# Patient Record
Sex: Female | Born: 1980 | Race: Black or African American | Hispanic: No | Marital: Married | Smoking: Former smoker
Health system: Southern US, Community
[De-identification: ages and names within clinical notes are randomized; demographics above are authoritative.]

## PROBLEM LIST (undated history)

## (undated) DIAGNOSIS — I10 Essential (primary) hypertension: Secondary | ICD-10-CM

## (undated) DIAGNOSIS — E785 Hyperlipidemia, unspecified: Secondary | ICD-10-CM

## (undated) DIAGNOSIS — D649 Anemia, unspecified: Secondary | ICD-10-CM

## (undated) DIAGNOSIS — J302 Other seasonal allergic rhinitis: Secondary | ICD-10-CM

## (undated) DIAGNOSIS — T7840XA Allergy, unspecified, initial encounter: Secondary | ICD-10-CM

## (undated) DIAGNOSIS — E7800 Pure hypercholesterolemia, unspecified: Secondary | ICD-10-CM

## (undated) HISTORY — DX: Hyperlipidemia, unspecified: E78.5

## (undated) HISTORY — DX: Allergy, unspecified, initial encounter: T78.40XA

## (undated) HISTORY — DX: Anemia, unspecified: D64.9

## (undated) HISTORY — PX: WRIST SURGERY: SHX841

## (undated) HISTORY — DX: Essential (primary) hypertension: I10

## (undated) HISTORY — DX: Other seasonal allergic rhinitis: J30.2

---

## 2005-06-06 ENCOUNTER — Emergency Department (HOSPITAL_COMMUNITY): Admission: EM | Admit: 2005-06-06 | Discharge: 2005-06-06 | Payer: Self-pay | Admitting: Emergency Medicine

## 2005-06-09 ENCOUNTER — Emergency Department (HOSPITAL_COMMUNITY): Admission: EM | Admit: 2005-06-09 | Discharge: 2005-06-09 | Payer: Self-pay | Admitting: Emergency Medicine

## 2010-06-20 ENCOUNTER — Emergency Department (HOSPITAL_COMMUNITY): Admission: EM | Admit: 2010-06-20 | Discharge: 2010-06-20 | Payer: Self-pay | Admitting: Emergency Medicine

## 2011-05-23 ENCOUNTER — Encounter: Payer: Self-pay | Admitting: *Deleted

## 2011-05-23 ENCOUNTER — Emergency Department (HOSPITAL_COMMUNITY)
Admission: EM | Admit: 2011-05-23 | Discharge: 2011-05-23 | Disposition: A | Discharge status: Home / Self Care | Payer: Self-pay | Attending: Emergency Medicine | Admitting: Emergency Medicine

## 2011-05-23 DIAGNOSIS — R51 Headache: Secondary | ICD-10-CM | POA: Insufficient documentation

## 2011-05-23 DIAGNOSIS — F17200 Nicotine dependence, unspecified, uncomplicated: Secondary | ICD-10-CM | POA: Insufficient documentation

## 2011-05-23 DIAGNOSIS — I10 Essential (primary) hypertension: Secondary | ICD-10-CM | POA: Insufficient documentation

## 2011-05-23 LAB — URINALYSIS, ROUTINE W REFLEX MICROSCOPIC
Bilirubin Urine: NEGATIVE
Ketones, ur: NEGATIVE mg/dL
Nitrite: NEGATIVE
Protein, ur: NEGATIVE mg/dL
Specific Gravity, Urine: 1.015 (ref 1.005–1.030)
pH: 6.5 (ref 5.0–8.0)

## 2011-05-23 LAB — URINE MICROSCOPIC-ADD ON

## 2011-05-23 MED ORDER — METOCLOPRAMIDE HCL 5 MG/ML IJ SOLN
10.0000 mg | Freq: Once | INTRAMUSCULAR | Status: AC
  Administered 2011-05-23: 10 mg via INTRAMUSCULAR
  Filled 2011-05-23: qty 2

## 2011-05-23 MED ORDER — KETOROLAC TROMETHAMINE 60 MG/2ML IM SOLN
60.0000 mg | Freq: Once | INTRAMUSCULAR | Status: AC
  Administered 2011-05-23: 60 mg via INTRAMUSCULAR
  Filled 2011-05-23: qty 2

## 2011-05-23 MED ORDER — DIPHENHYDRAMINE HCL 25 MG PO CAPS
25.0000 mg | ORAL_CAPSULE | Freq: Once | ORAL | Status: AC
  Administered 2011-05-23: 25 mg via ORAL
  Filled 2011-05-23: qty 1

## 2011-05-23 MED ORDER — BUTALBITAL-APAP-CAFFEINE 50-325-40 MG PO TABS: 1.0000 | ORAL_TABLET | Freq: Four times a day (QID) | ORAL | Status: AC | PRN

## 2011-05-23 NOTE — ED Notes (Signed)
Pt states that she has had a headache off and on since Saturday. C/o dizziness. B/P this am was 147/111 at home.

## 2011-05-23 NOTE — ED Provider Notes (Signed)
History     CSN: [id number] Arrival date & time: 05/23/2011  1:45 PM  Chief Complaint  Patient presents with  . Headache   HPI Comments: Patient c/o intermittent headache for 5 days.  Describes the headache as throbbing sensation to the back of her head and both temples.  Headache improves slightly with OTC medications.  She also c/o intermittent dizziness that worsens with change in position.  States she she feels faint at times although she denies syncope, vomiting, chest pain, shortness of breath or neck stiffness.  States that her blood pressure at home has been elevated for several days.  She was given a prescription for a blood pressure medication "a while back" that she never got filled because she thought she was too young to take BP medications   Patient is a 30 y.o. female presenting with headaches. The history is provided by the patient.  Headache  This is a new problem. The current episode started more than 2 days ago. The problem occurs every few hours. The problem has not changed since onset.The headache is associated with nothing. The pain is located in the bilateral and occipital region. The quality of the pain is described as dull and throbbing. The pain is moderate. The pain does not radiate. Pertinent negatives include no anorexia, no fever, no malaise/fatigue, no chest pressure, no near-syncope, no palpitations, no shortness of breath, no nausea and no vomiting. Associated symptoms comments: dizziness.    History reviewed. No pertinent past medical history.  History reviewed. No pertinent past surgical history.  Family History  Problem Relation Age of Onset  . Diabetes Mother   . Hypertension Mother   . Hypertension Father     History  Substance Use Topics  . Smoking status: Current Everyday Smoker -- 0.5 packs/day  . Smokeless tobacco: Not on file  . Alcohol Use: No    OB History    Grav Para Term Preterm Abortions TAB SAB Ect Mult Living                   Review of Systems  Constitutional: Negative for fever, chills, malaise/fatigue and appetite change.  HENT: Negative for sore throat, trouble swallowing, neck pain and neck stiffness.   Eyes: Negative for visual disturbance.  Respiratory: Negative for cough, chest tightness and shortness of breath.   Cardiovascular: Negative.  Negative for palpitations and near-syncope.  Gastrointestinal: Negative for nausea, vomiting, abdominal pain and anorexia.  Genitourinary: Negative for dysuria.  Musculoskeletal: Negative.   Neurological: Positive for headaches. Negative for seizures, syncope, facial asymmetry, weakness and numbness.  Hematological: Does not bruise/bleed easily.  Psychiatric/Behavioral: Negative for confusion and decreased concentration.    Physical Exam  BP 142/92  Pulse 84  Temp(Src) 98.8 F (37.1 C) (Oral)  Resp 20  Ht 5\' 6"  (1.676 m)  Wt 185 lb (83.915 kg)  BMI 29.86 kg/m2  SpO2 100%  LMP 04/15/2011  Physical Exam  Nursing note and vitals reviewed. Constitutional: She is oriented to person, place, and time. She appears well-developed and well-nourished. No distress.  HENT:  Head: Normocephalic and atraumatic.  Right Ear: External ear normal.  Left Ear: External ear normal.  Mouth/Throat: Oropharynx is clear and moist.  Eyes: EOM are normal. Pupils are equal, round, and reactive to light.  Neck: Normal range of motion and phonation normal. Neck supple. No JVD present. No spinous process tenderness and no muscular tenderness present. No rigidity. No tracheal deviation present. No Brudzinski's sign and no Kernig's sign  noted. No thyromegaly present.  Cardiovascular: Normal rate and normal heart sounds.   Pulmonary/Chest: Effort normal and breath sounds normal.  Abdominal: Soft. There is no tenderness. There is no rebound and no guarding.  Musculoskeletal: She exhibits no edema and no tenderness.  Lymphadenopathy:    She has no cervical adenopathy.  Neurological:  She is alert and oriented to person, place, and time. She has normal strength. No cranial nerve deficit or sensory deficit. She exhibits normal muscle tone. Coordination and gait normal. GCS eye subscore is 4. GCS verbal subscore is 5. GCS motor subscore is 6.  Skin: Skin is warm and dry.  Psychiatric: She has a normal mood and affect.    ED Course  Procedures  MDM   1700  Patient is feeling better, has drank fluids.  No orthostatic hypotension.  Ambulates w/o difficulty.  No meningeal signs.  No focal neuro deficits on exam.  Slightly hypertensive.  I have discussed the lab results and care plan with the pt.  She verbalized understanding of importance of close f/u with PMD to have BP rechecked.    Early Steel L. Svara Twyman, [place] 05/23/11 1711

## 2011-05-23 NOTE — ED Notes (Signed)
Pt reports ha for the past 2 days.  Pt states that her bp was elevated this a.m 147/111.  Pt denies a hx of htn.  Pt denies any n/v or sensitivity to light.  Pt reports taking vinegar and advil at home w/ minimal relief.  Pt denies any blurred vision, or weakness.  nad noted

## 2011-05-24 LAB — URINE CULTURE
Culture  Setup Time: 201208021812
Culture: NO GROWTH

## 2011-05-24 NOTE — ED Provider Notes (Signed)
History/physical exam/procedure(s) were performed by non-physician practitioner and as supervising physician I was immediately available for consultation/collaboration. I have reviewed all notes and am in agreement with care and plan.   Hilario Quarry, MD 05/24/11 0800

## 2012-12-17 ENCOUNTER — Emergency Department (HOSPITAL_COMMUNITY): Payer: Self-pay

## 2012-12-17 ENCOUNTER — Emergency Department (HOSPITAL_COMMUNITY)
Admission: EM | Admit: 2012-12-17 | Discharge: 2012-12-17 | Disposition: A | Discharge status: Home / Self Care | Payer: Self-pay | Attending: Emergency Medicine | Admitting: Emergency Medicine

## 2012-12-17 ENCOUNTER — Encounter (HOSPITAL_COMMUNITY): Payer: Self-pay | Admitting: *Deleted

## 2012-12-17 DIAGNOSIS — S0003XA Contusion of scalp, initial encounter: Secondary | ICD-10-CM | POA: Insufficient documentation

## 2012-12-17 DIAGNOSIS — Z23 Encounter for immunization: Secondary | ICD-10-CM | POA: Insufficient documentation

## 2012-12-17 DIAGNOSIS — S1093XA Contusion of unspecified part of neck, initial encounter: Secondary | ICD-10-CM | POA: Insufficient documentation

## 2012-12-17 DIAGNOSIS — S0083XA Contusion of other part of head, initial encounter: Secondary | ICD-10-CM

## 2012-12-17 DIAGNOSIS — F17200 Nicotine dependence, unspecified, uncomplicated: Secondary | ICD-10-CM | POA: Insufficient documentation

## 2012-12-17 MED ORDER — TETANUS-DIPHTH-ACELL PERTUSSIS 5-2.5-18.5 LF-MCG/0.5 IM SUSP
0.5000 mL | Freq: Once | INTRAMUSCULAR | Status: AC
  Administered 2012-12-17: 0.5 mL via INTRAMUSCULAR
  Filled 2012-12-17: qty 0.5

## 2012-12-17 MED ORDER — NAPROXEN 500 MG PO TABS: 500.0000 mg | ORAL_TABLET | Freq: Two times a day (BID) | ORAL | Status: DC

## 2012-12-17 MED ORDER — HYDROCODONE-ACETAMINOPHEN 5-325 MG PO TABS: ORAL_TABLET | ORAL | Status: DC
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
## 2012-12-20 NOTE — ED Provider Notes (Signed)
Medical screening examination/treatment/procedure(s) were performed by non-physician practitioner and as supervising physician I was immediately available for consultation/collaboration.   Shelda Jakes, MD 12/20/12 [phone]

## 2015-08-14 ENCOUNTER — Encounter (HOSPITAL_COMMUNITY): Payer: Self-pay | Admitting: Emergency Medicine

## 2015-08-14 ENCOUNTER — Other Ambulatory Visit: Payer: Self-pay

## 2015-08-14 ENCOUNTER — Emergency Department (HOSPITAL_COMMUNITY)
Admission: EM | Admit: 2015-08-14 | Discharge: 2015-08-14 | Disposition: A | Discharge status: Home / Self Care | Payer: Managed Care, Other (non HMO) | Attending: Emergency Medicine | Admitting: Emergency Medicine

## 2015-08-14 DIAGNOSIS — Z79899 Other long term (current) drug therapy: Secondary | ICD-10-CM | POA: Diagnosis not present

## 2015-08-14 DIAGNOSIS — R112 Nausea with vomiting, unspecified: Secondary | ICD-10-CM | POA: Diagnosis present

## 2015-08-14 DIAGNOSIS — Z3202 Encounter for pregnancy test, result negative: Secondary | ICD-10-CM | POA: Diagnosis not present

## 2015-08-14 DIAGNOSIS — R197 Diarrhea, unspecified: Secondary | ICD-10-CM | POA: Insufficient documentation

## 2015-08-14 DIAGNOSIS — R1013 Epigastric pain: Secondary | ICD-10-CM | POA: Diagnosis not present

## 2015-08-14 DIAGNOSIS — R0789 Other chest pain: Secondary | ICD-10-CM | POA: Diagnosis not present

## 2015-08-14 DIAGNOSIS — Z720 Tobacco use: Secondary | ICD-10-CM | POA: Insufficient documentation

## 2015-08-14 LAB — URINE MICROSCOPIC-ADD ON

## 2015-08-14 LAB — CBC WITH DIFFERENTIAL/PLATELET
BASOS ABS: 0 10*3/uL (ref 0.0–0.1)
BASOS PCT: 0 %
EOS ABS: 0.1 10*3/uL (ref 0.0–0.7)
EOS PCT: 2 %
HCT: 39.6 % (ref 36.0–46.0)
Hemoglobin: 13.1 g/dL (ref 12.0–15.0)
Lymphocytes Relative: 5 %
Lymphs Abs: 0.4 10*3/uL — ABNORMAL LOW (ref 0.7–4.0)
MCH: 27.1 pg (ref 26.0–34.0)
MCHC: 33.1 g/dL (ref 30.0–36.0)
MCV: 82 fL (ref 78.0–100.0)
MONO ABS: 0.6 10*3/uL (ref 0.1–1.0)
Monocytes Relative: 8 %
Neutro Abs: 6.8 10*3/uL (ref 1.7–7.7)
Neutrophils Relative %: 85 %
PLATELETS: 308 10*3/uL (ref 150–400)
RBC: 4.83 MIL/uL (ref 3.87–5.11)
RDW: 14.5 % (ref 11.5–15.5)
WBC: 7.9 10*3/uL (ref 4.0–10.5)

## 2015-08-14 LAB — URINALYSIS, ROUTINE W REFLEX MICROSCOPIC
Bilirubin Urine: NEGATIVE
GLUCOSE, UA: NEGATIVE mg/dL
KETONES UR: NEGATIVE mg/dL
Leukocytes, UA: NEGATIVE
Nitrite: NEGATIVE
PH: 5.5 (ref 5.0–8.0)
PROTEIN: 100 mg/dL — AB
Specific Gravity, Urine: >1.03 — ABNORMAL HIGH (ref 1.005–1.030)
Urobilinogen, UA: 0.2 mg/dL (ref 0.0–1.0)

## 2015-08-14 LAB — COMPREHENSIVE METABOLIC PANEL
ALBUMIN: 3.7 g/dL (ref 3.5–5.0)
ALT: 16 U/L (ref 14–54)
AST: 15 U/L (ref 15–41)
Alkaline Phosphatase: 85 U/L (ref 38–126)
Anion gap: 6 (ref 5–15)
BUN: 13 mg/dL (ref 6–20)
CHLORIDE: 109 mmol/L (ref 101–111)
CO2: 25 mmol/L (ref 22–32)
Calcium: 9.1 mg/dL (ref 8.9–10.3)
Creatinine, Ser: 0.71 mg/dL (ref 0.44–1.00)
GFR calc Af Amer: >60 mL/min (ref >60)
Glucose, Bld: 113 mg/dL — ABNORMAL HIGH (ref 65–99)
POTASSIUM: 3.8 mmol/L (ref 3.5–5.1)
Sodium: 140 mmol/L (ref 135–145)
Total Bilirubin: 0.6 mg/dL (ref 0.3–1.2)
Total Protein: 8 g/dL (ref 6.5–8.1)

## 2015-08-14 LAB — TROPONIN I

## 2015-08-14 LAB — LIPASE, BLOOD: LIPASE: 20 U/L (ref 11–51)

## 2015-08-14 LAB — POC URINE PREG, ED: PREG TEST UR: NEGATIVE

## 2015-08-14 MED ORDER — ONDANSETRON 4 MG PO TBDP: ORAL_TABLET | ORAL | Status: DC

## 2015-08-14 MED ORDER — ONDANSETRON 4 MG PO TBDP
4.0000 mg | ORAL_TABLET | Freq: Once | ORAL | Status: AC
  Administered 2015-08-14: 4 mg via ORAL
  Filled 2015-08-14: qty 1

## 2015-08-14 MED ORDER — SODIUM CHLORIDE 0.9 % IV BOLUS (SEPSIS)
1000.0000 mL | Freq: Once | INTRAVENOUS | Status: AC
  Administered 2015-08-14: 1000 mL via INTRAVENOUS

## 2015-08-14 NOTE — ED Provider Notes (Signed)
CSN: [id number]     Arrival date & time 08/14/15  1016 History  By signing my name below, I, Miranda Wade, attest that this documentation has been prepared under the direction and in the presence of Miranda Morrison, MD. Electronically Signed: Terressa Wade, ED Scribe. 08/14/2015. 11:27 AM.  Chief Complaint  Patient presents with  . Nausea   The history is provided by the patient. No language interpreter was used.   PCP: No primary care provider on file. HPI Comments: Miranda Wade is a 34 y.o. female who presents to the Emergency Department complaining of n/v/d with associated, 8/10, stabbing mid abd pain  onset at 1AM. Pt reports 10 episodes of watery stool and 5 episodes of vomiting since onset of Sx. Pt denies blood in stool, sick contact, new foods, recent travels, chronic health conditions, urinary Sx, fever, tobacco use, EtOH use, or any other Sx at this time.   History reviewed. No pertinent past medical history. History reviewed. No pertinent past surgical history. Family History  Problem Relation Age of Onset  . Diabetes Mother   . Hypertension Mother   . Hypertension Father    Social History  Substance Use Topics  . Smoking status: Current Every Day Smoker -- 0.50 packs/day  . Smokeless tobacco: None  . Alcohol Use: No   OB History    No data available     Review of Systems  Constitutional: Negative for fever.  Gastrointestinal: Positive for nausea, vomiting, abdominal pain and diarrhea. Negative for blood in stool.  Genitourinary: Negative for dysuria, urgency, frequency, decreased urine volume and difficulty urinating.  All other systems reviewed and are negative.  Allergies  Review of patient's allergies indicates no known allergies.  Home Medications   Prior to Admission medications   Medication Sig Start Date End Date Taking? Authorizing Provider  bismuth subsalicylate (PEPTO BISMOL) 262 MG/15ML suspension Take 30 mLs by mouth every 6 (six) hours as needed  for indigestion or diarrhea or loose stools.   Yes Historical Provider, MD  ibuprofen (ADVIL,MOTRIN) 200 MG tablet Take 400 mg by mouth every 6 (six) hours as needed for moderate pain.   Yes Historical Provider, MD  loperamide (IMODIUM) 2 MG capsule Take 2 mg by mouth as needed for diarrhea or loose stools.   Yes Historical Provider, MD  losartan (COZAAR) 50 MG tablet Take 1 tablet by mouth daily. 08/08/15  Yes Historical Provider, MD  medroxyPROGESTERone (DEPO-PROVERA) 150 MG/ML injection Inject 150 mg into the muscle every 3 (three) months.   Yes Historical Provider, MD   Triage Vitals: BP 146/106 mmHg  Pulse 86  Temp(Src) 98.2 F (36.8 C) (Oral)  Resp 18  Ht 5\' 5"  (1.651 m)  Wt 214 lb (97.07 kg)  BMI 35.61 kg/m2  SpO2 100% Physical Exam  Constitutional: She is oriented to person, place, and time. She appears well-developed and well-nourished. No distress.  HENT:  Head: Normocephalic.  Eyes: EOM are normal.  Neck: Normal range of motion.  Pulmonary/Chest: Effort normal.  Abdominal: She exhibits no distension. There is no tenderness.  No focal tenderness, no peritonitis  Musculoskeletal: Normal range of motion.  Neurological: She is alert and oriented to person, place, and time.  Psychiatric: She has a normal mood and affect.  Nursing note and vitals reviewed.  ED Course  Procedures (including critical care time)  EMERGENCY DEPARTMENT US GALLBLADDER INTERPRETATION "Study: Limited Ultrasound of the gallbladder and common bile duct."  INDICATIONS: Abdominal pain and Nausea Indication: Multiple views of the  gallbladder and common bile duct are obtained with a  Multi-frequency probe."  PERFORMED BY:  Myself  IMAGES ARCHIVED?: Yes  FINDINGS: Gallstones absent, Gallbladder wall normal in thickness and Sonographic Murphy's sign absent  LIMITATIONS: Bowel Gas  INTERPRETATION: Normal  COMMENT:    DIAGNOSTIC STUDIES: Oxygen Saturation is 100% on RA, nl by my interpretation.     COORDINATION OF CARE: 11:23 AM: Discussed treatment plan which includes ultrasound of abd with pt at bedside; patient verbalizes understanding and agrees with treatment plan.  Labs Review Labs Reviewed  CBC WITH DIFFERENTIAL/PLATELET - Abnormal; Notable for the following:    Lymphs Abs 0.4 (*)    All other components within normal limits  COMPREHENSIVE METABOLIC PANEL - Abnormal; Notable for the following:    Glucose, Bld 113 (*)    All other components within normal limits  URINALYSIS, ROUTINE W REFLEX MICROSCOPIC (NOT AT [facility]) - Abnormal; Notable for the following:    APPearance HAZY (*)    Specific Gravity, Urine >1.030 (*)    Hgb urine dipstick LARGE (*)    Protein, ur 100 (*)    All other components within normal limits  URINE MICROSCOPIC-ADD ON - Abnormal; Notable for the following:    Squamous Epithelial / LPF FEW (*)    Bacteria, UA MANY (*)    All other components within normal limits  TROPONIN I  LIPASE, BLOOD  POC URINE PREG, ED   I have personally reviewed and evaluated these lab results as part of my medical decision-making.  EKG reviewed heart rate 74, sinus, no acute ST elevation, normal QT, mild artifact. MDM   Final diagnoses:  Atypical chest pain  Nausea vomiting and diarrhea   Patient presents primarily with clinical concern for gastroenteritis with vomiting diarrhea upper epigastric discomfort. Patient also had intermittent atypical epigastric and lower chest tightness, patient very low risk. Plan for cardiac screen, supportive care and close outpatient follow-up. Patient tolerating oral in the ER. Pt is smoker.   Results and differential diagnosis were discussed with the patient/parent/guardian. Xrays were independently reviewed by myself.  Close follow up outpatient was discussed, comfortable with the plan.   Medications  ondansetron (ZOFRAN-ODT) disintegrating tablet 4 mg (4 mg Oral Given 08/14/15 1041)  sodium chloride 0.9 % bolus 1,000 mL (1,000  mLs Intravenous New Bag/Given 08/14/15 1140)    Filed Vitals:   08/14/15 1024  BP: 146/106  Pulse: 86  Temp: 98.2 F (36.8 C)  TempSrc: Oral  Resp: 18  Height: 5\' 5"  (1.651 m)  Weight: 214 lb (97.07 kg)  SpO2: 100%    Final diagnoses:  Atypical chest pain  Nausea vomiting and diarrhea       Miranda Morrison, MD 08/14/15 1408

## 2015-08-14 NOTE — Discharge Instructions (Signed)
If you were given medicines take as directed.  If you are on coumadin or contraceptives realize their levels and effectiveness is altered by many different medicines.  If you have any reaction (rash, tongues swelling, other) to the medicines stop taking and see a physician.    If your blood pressure was elevated in the ER make sure you follow up for management with a primary doctor or return for chest pain, shortness of breath or stroke symptoms.  Please follow up as directed and return to the ER or see a physician for new or worsening symptoms.  Thank you. Filed Vitals:   08/14/15 1024  BP: 146/106  Pulse: 86  Temp: 98.2 F (36.8 C)  TempSrc: Oral  Resp: 18  Height: 5\' 5"  (1.651 m)  Weight: 214 lb (97.07 kg)  SpO2: 100%

## 2015-08-14 NOTE — ED Notes (Signed)
Having n/v/d since 1 am this am.  Had about 10 watery stools and vomited about 5 times.  Having sharp pain to mid abdomen.

## 2016-04-19 ENCOUNTER — Emergency Department (HOSPITAL_COMMUNITY)
Admission: EM | Admit: 2016-04-19 | Discharge: 2016-04-19 | Disposition: A | Discharge status: Home / Self Care | Payer: Managed Care, Other (non HMO) | Attending: Emergency Medicine | Admitting: Emergency Medicine

## 2016-04-19 ENCOUNTER — Encounter (HOSPITAL_COMMUNITY): Payer: Self-pay | Admitting: Emergency Medicine

## 2016-04-19 DIAGNOSIS — R51 Headache: Secondary | ICD-10-CM | POA: Diagnosis present

## 2016-04-19 DIAGNOSIS — Z791 Long term (current) use of non-steroidal anti-inflammatories (NSAID): Secondary | ICD-10-CM | POA: Diagnosis not present

## 2016-04-19 DIAGNOSIS — Z79899 Other long term (current) drug therapy: Secondary | ICD-10-CM | POA: Insufficient documentation

## 2016-04-19 DIAGNOSIS — I159 Secondary hypertension, unspecified: Secondary | ICD-10-CM | POA: Insufficient documentation

## 2016-04-19 DIAGNOSIS — Z87891 Personal history of nicotine dependence: Secondary | ICD-10-CM | POA: Diagnosis not present

## 2016-04-19 HISTORY — DX: Essential (primary) hypertension: I10

## 2016-04-19 HISTORY — DX: Pure hypercholesterolemia, unspecified: E78.00

## 2016-04-19 LAB — I-STAT CHEM 8, ED
BUN: 8 mg/dL (ref 6–20)
CHLORIDE: 105 mmol/L (ref 101–111)
Calcium, Ion: 1.2 mmol/L (ref 1.13–1.30)
Creatinine, Ser: 0.6 mg/dL (ref 0.44–1.00)
Glucose, Bld: 100 mg/dL — ABNORMAL HIGH (ref 65–99)
HEMATOCRIT: 39 % (ref 36.0–46.0)
Hemoglobin: 13.3 g/dL (ref 12.0–15.0)
POTASSIUM: 3.6 mmol/L (ref 3.5–5.1)
SODIUM: 141 mmol/L (ref 135–145)
TCO2: 22 mmol/L (ref 0–100)

## 2016-04-19 LAB — URINALYSIS, ROUTINE W REFLEX MICROSCOPIC
BILIRUBIN URINE: NEGATIVE
Glucose, UA: NEGATIVE mg/dL
Ketones, ur: NEGATIVE mg/dL
Leukocytes, UA: NEGATIVE
NITRITE: NEGATIVE
Protein, ur: NEGATIVE mg/dL
SPECIFIC GRAVITY, URINE: 1.02 (ref 1.005–1.030)
pH: 7 (ref 5.0–8.0)

## 2016-04-19 LAB — URINE MICROSCOPIC-ADD ON

## 2016-04-19 MED ORDER — LOSARTAN POTASSIUM 50 MG PO TABS: 50.0000 mg | ORAL_TABLET | Freq: Every day | ORAL | Status: DC

## 2016-04-19 MED ORDER — LOSARTAN POTASSIUM 50 MG PO TABS
50.0000 mg | ORAL_TABLET | Freq: Once | ORAL | Status: AC
  Administered 2016-04-19: 50 mg via ORAL
  Filled 2016-04-19: qty 1

## 2016-04-19 NOTE — ED Provider Notes (Signed)
CSN: [id number]     Arrival date & time 04/19/16  0900 History  By signing my name below, I, Miranda Wade, attest that this documentation has been prepared under the direction and in the presence of [name], MD.  Electronically Signed: Tedra Wade. Sheppard Coil, ED Scribe. 04/19/2016. 10:02 AM.    Chief Complaint  Patient presents with  . Hypertension   The history is provided by the patient. No language interpreter was used.   HPI Comments: Miranda Wade is a 35 y.o. female with PMHx of HTN who presents to the Emergency Department complaining of sudden onset, hypertensive episodes that began PTA. Pt states she was at work when she began having associated headaches and lightheadedness. She reports having her blood pressure checked numerous times at work with it ranging from 178/120 to 172/112 when she was told to come to [place]. Pt admits to having urinary frequency also. She states former PCP stopped taking her insurance so she has been unable to get her blood pressure medicine refilled for a few months, which was Losartan. Dr. Buelah Manis is her new PCP and she was told that if she lost weight she would not need any HTN medications. Denies any leg swelling, SOB, chest pain, dysuria, abdominal pain, numbness or weakness.  Past Medical History  Diagnosis Date  . Hypertension   . Hypercholesteremia    History reviewed. No pertinent past surgical history. Family History  Problem Relation Age of Onset  . Diabetes Mother   . Hypertension Mother   . Hypertension Father    Social History  Substance Use Topics  . Smoking status: Former Smoker -- 0.50 packs/day  . Smokeless tobacco: None  . Alcohol Use: No   OB History    No data available     Review of Systems  Constitutional: Negative for fever.  Respiratory: Negative for shortness of breath.   Cardiovascular: Negative for chest pain and leg swelling.  Gastrointestinal: Negative for abdominal pain.  Genitourinary: Positive for  frequency. Negative for dysuria.  Neurological: Positive for light-headedness and headaches. Negative for seizures and weakness.  All other systems reviewed and are negative.   Allergies  Review of patient's allergies indicates no known allergies.  Home Medications   Prior to Admission medications   Medication Sig Start Date End Date Taking? Authorizing Provider  ibuprofen (ADVIL,MOTRIN) 200 MG tablet Take 400 mg by mouth every 6 (six) hours as needed for moderate pain.   Yes Historical Provider, MD  medroxyPROGESTERone (DEPO-PROVERA) 150 MG/ML injection Inject 150 mg into the muscle every 3 (three) months.   Yes Historical Provider, MD  losartan (COZAAR) 50 MG tablet Take 1 tablet (50 mg total) by mouth daily. 04/19/16   [name], MD   BP 158/98 mmHg  Pulse 80  Temp(Src) 98.2 F (36.8 C) (Oral)  Resp 16  Ht 5\' 5"  (1.651 m)  Wt 220 lb (99.791 kg)  BMI 36.61 kg/m2  SpO2 100% Physical Exam  Constitutional: She is oriented to person, place, and time. She appears well-developed and well-nourished. No distress.  HENT:  Head: Normocephalic and atraumatic.  Eyes: Conjunctivae are normal.  Cardiovascular: Normal rate and regular rhythm.   Murmur heard. 2/6 systolic heart murmur.  Pulmonary/Chest: Effort normal and breath sounds normal. No respiratory distress.  Abdominal: Soft. Bowel sounds are normal. She exhibits no distension. There is tenderness. There is guarding. There is no rebound.  Musculoskeletal: Normal range of motion. She exhibits no edema.  No lower extremity edema.  Neurological:  She is alert and oriented to person, place, and time. She has normal reflexes. No cranial nerve deficit. Coordination normal.  Good strength and sensation in lower and upper extremities. Symmetric DTR's in bilateral biceps and patellas.  Skin: Skin is warm and dry.  Psychiatric: She has a normal mood and affect.  Nursing note and vitals reviewed.   ED Course  Procedures (including  critical care time) DIAGNOSTIC STUDIES: Oxygen Saturation is 100% on RA, normal by my interpretation.    COORDINATION OF CARE: 9:25 AM-Discussed treatment plan which includes UA with pt at bedside and pt agreed to plan. Will order Cozaar.  Labs Review Labs Reviewed  URINALYSIS, ROUTINE W REFLEX MICROSCOPIC (NOT AT [facility]) - Abnormal; Notable for the following:    Hgb urine dipstick MODERATE (*)    All other components within normal limits  URINE MICROSCOPIC-ADD ON - Abnormal; Notable for the following:    Squamous Epithelial / LPF 6-30 (*)    Bacteria, UA MANY (*)    All other components within normal limits  I-STAT CHEM 8, ED - Abnormal; Notable for the following:    Glucose, Bld 100 (*)    All other components within normal limits     MDM   Final diagnoses:  Secondary hypertension, unspecified   This is a 35 yo F who presents with hypertension. They have had blood pressures as high as 0000000 systolic. Not had any severe headache, chest pain, shortness of breath, abdominal pain, back pain lower extremity edema. Did have increase urinary frequency beyond normal. Has not been taking blood pressure medications as directed by the primary doctor. Exam does not show any evidence of neurologic changes, fluid overload, vision changes or other acute issues secondary to hypertension. Hwoever, with increased frequency will check urine and kidney function.  Labs okay. Urinalysis with little bit of bacteria however no evidence of actual infection. This is more likely related to colonization. We'll send culture.  No other evidence of end organ damage on exam or labs and for discharge on her previous hypertensive medication until she'll follow-up with her primary doctor.   New Prescriptions: New Prescriptions   LOSARTAN (COZAAR) 50 MG TABLET    Take 1 tablet (50 mg total) by mouth daily.     I have personally and contemperaneously reviewed labs and imaging and used in my decision making as above.    A medical screening exam was performed and I feel the patient has had an appropriate workup for their chief complaint at this time and likelihood of emergent condition existing is low and thus workup can continue on an outpatient basis.. Their vital signs are stable. They have been counseled on decision, discharge, follow up and which symptoms necessitate immediate return to the emergency department.  They verbally stated understanding and agreement with plan and discharged in stable condition.    I personally performed the services described in this documentation, which was scribed in my presence. The recorded information has been reviewed and is accurate.    [name], MD 04/19/16 1022

## 2016-04-19 NOTE — ED Notes (Signed)
Pt reports slight h/a this morning with increased bp [id number] systolic while at work.  Pt has recently switched doctors and has not been able to obtain her normal bp medication.  Pt reports she has not had medication in a couple months.

## 2016-04-19 NOTE — ED Notes (Signed)
MD at bedside. 

## 2016-04-19 NOTE — ED Notes (Signed)
Pt made aware to return if symptoms worsen or if any life threatening symptoms occur.   

## 2016-04-19 NOTE — ED Notes (Signed)
Pt ambulated to bathroom to collect urine sample, pt has steady gait.

## 2016-04-30 ENCOUNTER — Encounter: Payer: Self-pay | Admitting: Family Medicine

## 2016-04-30 ENCOUNTER — Ambulatory Visit (INDEPENDENT_AMBULATORY_CARE_PROVIDER_SITE_OTHER): Payer: Managed Care, Other (non HMO) | Admitting: Family Medicine

## 2016-04-30 VITALS — BP 138/84 | HR 82 | Temp 98.4°F | Resp 14 | Ht 65.0 in | Wt 224.0 lb

## 2016-04-30 DIAGNOSIS — I10 Essential (primary) hypertension: Secondary | ICD-10-CM | POA: Insufficient documentation

## 2016-04-30 DIAGNOSIS — R011 Cardiac murmur, unspecified: Secondary | ICD-10-CM | POA: Diagnosis not present

## 2016-04-30 DIAGNOSIS — R350 Frequency of micturition: Secondary | ICD-10-CM

## 2016-04-30 DIAGNOSIS — E785 Hyperlipidemia, unspecified: Secondary | ICD-10-CM | POA: Insufficient documentation

## 2016-04-30 DIAGNOSIS — E669 Obesity, unspecified: Secondary | ICD-10-CM | POA: Diagnosis not present

## 2016-04-30 LAB — COMPREHENSIVE METABOLIC PANEL
ALBUMIN: 3.8 g/dL (ref 3.6–5.1)
ALK PHOS: 89 U/L (ref 33–115)
ALT: 12 U/L (ref 6–29)
AST: 14 U/L (ref 10–30)
BILIRUBIN TOTAL: 0.3 mg/dL (ref 0.2–1.2)
BUN: 11 mg/dL (ref 7–25)
CALCIUM: 8.8 mg/dL (ref 8.6–10.2)
CO2: 24 mmol/L (ref 20–31)
Chloride: 106 mmol/L (ref 98–110)
Creat: 0.77 mg/dL (ref 0.50–1.10)
GLUCOSE: 86 mg/dL (ref 70–99)
POTASSIUM: 4.8 mmol/L (ref 3.5–5.3)
Sodium: 137 mmol/L (ref 135–146)
TOTAL PROTEIN: 7 g/dL (ref 6.1–8.1)

## 2016-04-30 LAB — LIPID PANEL
CHOL/HDL RATIO: 5.5 ratio — AB (ref <=5.0)
CHOLESTEROL: 216 mg/dL — AB (ref 125–200)
HDL: 39 mg/dL — ABNORMAL LOW (ref >=46)
LDL Cholesterol: 167 mg/dL — ABNORMAL HIGH (ref <130)
TRIGLYCERIDES: 48 mg/dL (ref <150)
VLDL: 10 mg/dL (ref <30)

## 2016-04-30 LAB — CBC WITH DIFFERENTIAL/PLATELET
BASOS PCT: 1 %
Basophils Absolute: 64 cells/uL (ref 0–200)
EOS ABS: 256 {cells}/uL (ref 15–500)
Eosinophils Relative: 4 %
HEMATOCRIT: 37.3 % (ref 35.0–45.0)
HEMOGLOBIN: 12.1 g/dL (ref 12.0–15.0)
LYMPHS ABS: 1664 {cells}/uL (ref 850–3900)
LYMPHS PCT: 26 %
MCH: 26.4 pg — ABNORMAL LOW (ref 27.0–33.0)
MCHC: 32.4 g/dL (ref 32.0–36.0)
MCV: 81.4 fL (ref 80.0–100.0)
MONO ABS: 576 {cells}/uL (ref 200–950)
MPV: 9.1 fL (ref 7.5–12.5)
Monocytes Relative: 9 %
Neutro Abs: 3840 cells/uL (ref 1500–7800)
Neutrophils Relative %: 60 %
Platelets: 332 10*3/uL (ref 140–400)
RBC: 4.58 MIL/uL (ref 3.80–5.10)
RDW: 14.4 % (ref 11.0–15.0)
WBC: 6.4 10*3/uL (ref 3.8–10.8)

## 2016-04-30 LAB — URINALYSIS, ROUTINE W REFLEX MICROSCOPIC
Bilirubin Urine: NEGATIVE
Glucose, UA: NEGATIVE
Ketones, ur: NEGATIVE
NITRITE: NEGATIVE
PH: 7 (ref 5.0–8.0)
Protein, ur: NEGATIVE
Specific Gravity, Urine: 1.02 (ref 1.001–1.035)

## 2016-04-30 LAB — URINALYSIS, MICROSCOPIC ONLY
CASTS: NONE SEEN [LPF]
Crystals: NONE SEEN [HPF]

## 2016-04-30 LAB — TSH: TSH: 1.31 mIU/L

## 2016-04-30 MED ORDER — HYDROCHLOROTHIAZIDE 12.5 MG PO CAPS: 12.5000 mg | ORAL_CAPSULE | Freq: Every day | ORAL | Status: DC

## 2016-04-30 NOTE — Assessment & Plan Note (Signed)
Soft systolic murmur noted also in the setting however hypertension and she does have family history of hypertension and valve problems will obtain a 2-D echo EKG today was reassuring.

## 2016-04-30 NOTE — Assessment & Plan Note (Signed)
May be more OAB, check UA for glucose, protein, sign of UTI

## 2016-04-30 NOTE — Progress Notes (Signed)
Patient ID: Miranda Wade, female   DOB: [birth year], 35 y.o.   MRN: [id number]    Subjective:    Patient ID: Miranda Wade, female    DOB: 21-Jul-1981, 35 y.o.   MRN: [id number]  Patient [facility]  She here to establish care. Her previous primary care provider was [facility] in [place]. She was also [facility] for her GYN needs/Pap smear. Next  She has history of hypertension she is significant family history of hypertension as well. She was on losartan states her blood sugars still not well-controlled and still running 1 4150s over 90s. 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 She denies any chest pain but states that she does get headaches and dizziness when her blood pressure goes up. She was seen in the ER about 2 weeks ago she not had any medication months as her previous primary care prior provider stopped taking her insurance. She is also found to have a heart murmur which she states she was told about a couple years ago but has never had any workup. Her father has heart disease and has had a valve replacement.  She's been told she's had high cholesterol she was prescribed medication the past to stop it on her own and no particular reason  She also has urinary frequency that has been going on for the past 6-8 months. She is not aware of any borderline diabetes denies any dysuria. Denies any blood in urine. She states that she gets up about 4 times a night and often will snack because she is up which is also went to some weight gain. She denies any difficulties with her bowels.  She is currently at her heaviest adult weight states that she was 120s 130s but then she started Depakote which she was on for about 15 years she now has Nexplanon   quit tobacco 4 years ago    Contacts- [place] doctor   Review Of Systems: per above   GEN- denies fatigue, fever, weight  loss,weakness, recent illness HEENT- denies eye drainage, change in vision, nasal discharge, CVS- denies chest pain, palpitations RESP- denies SOB, cough, wheeze ABD- denies N/V, change in stools, abd pain GU- denies dysuria, hematuria, dribbling, incontinence MSK- denies joint pain, muscle aches, injury Neuro-+ headache,+ dizziness, syncope, seizure activity       Objective:    BP 138/84 mmHg  Pulse 82  Temp(Src) 98.4 F (36.9 C) (Oral)  Resp 14  Ht 5\' 5"  (1.651 m)  Wt 224 lb (101.606 kg)  BMI 37.28 kg/m2 GEN- NAD, alert and oriented x3 HEENT- PERRL, EOMI, non injected sclera, pink conjunctiva, MMM, oropharynx clear Neck- Supple, ?mild  thyromegaly CVS- RRR, no murmur RESP-CTAB ABD-NABS,soft,NT,ND  NEURO-CNII-XII in tact no focal deficits  EXT- No edema Pulses- Radial, DP- 2+  EKG-NSR, mild atrial enlargement      Assessment & Plan:      Problem List Items Addressed This Visit    Urinary frequency    May be more OAB, check UA for glucose, protein, sign of UTI      Relevant Orders   Urinalysis, Routine w reflex microscopic (not at [facility]) (Completed)   Urine culture   Obesity   Hyperlipidemia   Relevant Medications   hydrochlorothiazide (MICROZIDE) 12.5 MG capsule   Other Relevant Orders   Lipid panel   Heart murmur    Soft systolic murmur noted  also in the setting however hypertension and she does have family history of hypertension and valve problems will obtain a 2-D echo EKG today was reassuring.      Relevant Orders   ECHOCARDIOGRAM COMPLETE   Essential hypertension, benign - Primary    Uncontrolled blood pressure. Discussed her dietary issues as well as weight. I will start hydrochlorothiazide in addition to the losartan to be taken during the daytime. Fasting labs today She may have some mild thyroid enlargement as well and check a TSH      Relevant Medications   hydrochlorothiazide (MICROZIDE) 12.5 MG capsule   Other Relevant Orders   EKG  12-Lead (Completed)   CBC with Differential/Platelet   Comprehensive metabolic panel   Lipid panel   TSH   ECHOCARDIOGRAM COMPLETE      Note: This dictation was prepared with Dragon dictation along with smaller phrase technology. Any transcriptional errors that result from this process are unintentional.

## 2016-04-30 NOTE — Patient Instructions (Addendum)
Release of records- [place] [facility] ( including last PAP Smear ) 2D Echo to be done  Start HCTZ take in the morning  F/U 4 weeks for blood pressure

## 2016-04-30 NOTE — Assessment & Plan Note (Addendum)
Uncontrolled blood pressure. Discussed her dietary issues as well as weight. I will start hydrochlorothiazide in addition to the losartan to be taken during the daytime. Fasting labs today She may have some mild thyroid enlargement as well and check a TSH

## 2016-05-01 LAB — URINE CULTURE

## 2016-05-06 ENCOUNTER — Emergency Department (HOSPITAL_COMMUNITY): Payer: Managed Care, Other (non HMO)

## 2016-05-06 ENCOUNTER — Emergency Department (HOSPITAL_COMMUNITY)
Admission: EM | Admit: 2016-05-06 | Discharge: 2016-05-06 | Disposition: A | Discharge status: Home / Self Care | Payer: Managed Care, Other (non HMO) | Attending: Emergency Medicine | Admitting: Emergency Medicine

## 2016-05-06 ENCOUNTER — Encounter (HOSPITAL_COMMUNITY): Payer: Self-pay | Admitting: Emergency Medicine

## 2016-05-06 DIAGNOSIS — Z87891 Personal history of nicotine dependence: Secondary | ICD-10-CM | POA: Insufficient documentation

## 2016-05-06 DIAGNOSIS — I10 Essential (primary) hypertension: Secondary | ICD-10-CM | POA: Diagnosis not present

## 2016-05-06 DIAGNOSIS — Z791 Long term (current) use of non-steroidal anti-inflammatories (NSAID): Secondary | ICD-10-CM | POA: Insufficient documentation

## 2016-05-06 DIAGNOSIS — Z79899 Other long term (current) drug therapy: Secondary | ICD-10-CM | POA: Diagnosis not present

## 2016-05-06 DIAGNOSIS — R0789 Other chest pain: Secondary | ICD-10-CM | POA: Insufficient documentation

## 2016-05-06 DIAGNOSIS — R002 Palpitations: Secondary | ICD-10-CM

## 2016-05-06 LAB — BASIC METABOLIC PANEL
ANION GAP: 7 (ref 5–15)
BUN: 16 mg/dL (ref 6–20)
CALCIUM: 8.8 mg/dL — AB (ref 8.9–10.3)
CO2: 24 mmol/L (ref 22–32)
Chloride: 102 mmol/L (ref 101–111)
Creatinine, Ser: 0.75 mg/dL (ref 0.44–1.00)
GFR calc Af Amer: >60 mL/min (ref >60)
GFR calc non Af Amer: >60 mL/min (ref >60)
Glucose, Bld: 102 mg/dL — ABNORMAL HIGH (ref 65–99)
POTASSIUM: 3.6 mmol/L (ref 3.5–5.1)
SODIUM: 133 mmol/L — AB (ref 135–145)

## 2016-05-06 LAB — D-DIMER, QUANTITATIVE (NOT AT ARMC)

## 2016-05-06 LAB — CBC
HCT: 39.3 % (ref 36.0–46.0)
Hemoglobin: 12.8 g/dL (ref 12.0–15.0)
MCH: 26.5 pg (ref 26.0–34.0)
MCHC: 32.6 g/dL (ref 30.0–36.0)
MCV: 81.4 fL (ref 78.0–100.0)
PLATELETS: 358 10*3/uL (ref 150–400)
RBC: 4.83 MIL/uL (ref 3.87–5.11)
RDW: 14.3 % (ref 11.5–15.5)
WBC: 8.2 10*3/uL (ref 4.0–10.5)

## 2016-05-06 LAB — I-STAT TROPONIN, ED: Troponin i, poc: 0.02 ng/mL (ref 0.00–0.08)

## 2016-05-06 LAB — TROPONIN I: Troponin I: <0.03 ng/mL (ref <0.03)

## 2016-05-06 MED ORDER — IBUPROFEN 600 MG PO TABS: 600.0000 mg | ORAL_TABLET | Freq: Three times a day (TID) | ORAL | Status: DC | PRN

## 2016-05-06 MED ORDER — KETOROLAC TROMETHAMINE 60 MG/2ML IM SOLN
60.0000 mg | Freq: Once | INTRAMUSCULAR | Status: AC
  Administered 2016-05-06: 60 mg via INTRAMUSCULAR
  Filled 2016-05-06: qty 2

## 2016-05-06 NOTE — ED Notes (Signed)
Troponin did not result on Istat rerunning

## 2016-05-06 NOTE — ED Notes (Signed)
Patient complaining of mid sternal sharp chest pain x 3 days.

## 2016-05-06 NOTE — ED Notes (Signed)
Lab in to draw

## 2016-05-06 NOTE — ED Notes (Signed)
Pt reports pain relief with medication

## 2016-05-06 NOTE — ED Provider Notes (Signed)
CSN: [id number]     Arrival date & time 05/06/16  1549 History   First MD Initiated Contact with Patient 05/06/16 1945     Chief Complaint  Patient presents with  . Chest Pain     (Consider location/radiation/quality/duration/timing/severity/associated sxs/prior Treatment) HPI Patient presents with left-sided chest pain that started on Saturday. Describes it as sharp and episodic in nature. She also states there is a crampy element that is persistent. She denies any shortness of breath or recent cough. No fever or chills. No new lower extremity swelling or pain. No recent extended travel or immobilization. No family history of coronary artery disease or pulmonary embolism. Patient has distant smoking history of half pack a day. States she had the sharp pain this afternoon around 2:30. Just associated with rapid palpitations. She has the sharp pain but states that the "cramping" sensation is present. Past Medical History  Diagnosis Date  . Hypertension   . Hypercholesteremia   . Allergy     seasonal   History reviewed. No pertinent past surgical history. Family History  Problem Relation Age of Onset  . Diabetes Mother   . Hypertension Mother   . Arthritis Mother   . Hyperlipidemia Mother   . Stroke Mother   . Hypertension Father   . Heart disease Father     valve replacement  . Hyperlipidemia Father   . Asthma Daughter    Social History  Substance Use Topics  . Smoking status: Former Smoker -- 0.50 packs/day  . Smokeless tobacco: Never Used  . Alcohol Use: No   OB History    No data available     Review of Systems  Constitutional: Negative for fever, chills and fatigue.  Respiratory: Negative for chest tightness and shortness of breath.   Cardiovascular: Positive for chest pain. Negative for palpitations and leg swelling.  Gastrointestinal: Negative for nausea, vomiting, abdominal pain, diarrhea and constipation.  Genitourinary: Negative for dysuria and flank pain.   Musculoskeletal: Negative for myalgias, back pain, arthralgias, neck pain and neck stiffness.  Skin: Negative for rash.  Neurological: Negative for dizziness, weakness, light-headedness, numbness and headaches.  All other systems reviewed and are negative.     Allergies  Review of patient's allergies indicates no known allergies.  Home Medications   Prior to Admission medications   Medication Sig Start Date End Date Taking? Authorizing Provider  etonogestrel (NEXPLANON) 68 MG IMPL implant 1 each by Subdermal route once.    Historical Provider, MD  hydrochlorothiazide (MICROZIDE) 12.5 MG capsule Take 1 capsule (12.5 mg total) by mouth daily. 04/30/16   Alycia Rossetti, MD  ibuprofen (ADVIL,MOTRIN) 600 MG tablet Take 1 tablet (600 mg total) by mouth 3 (three) times daily with meals as needed. 05/06/16   Julianne Rice, MD  losartan (COZAAR) 50 MG tablet Take 1 tablet (50 mg total) by mouth daily. 04/19/16   [name] Mesner, MD   BP 135/78 mmHg  Pulse 78  Temp(Src) 98.3 F (36.8 C) (Oral)  Resp 16  Ht 5\' 6"  (1.676 m)  Wt 224 lb (101.606 kg)  BMI 36.17 kg/m2  SpO2 100% Physical Exam  Constitutional: She is oriented to person, place, and time. She appears well-developed and well-nourished. No distress.  HENT:  Head: Normocephalic and atraumatic.  Mouth/Throat: Oropharynx is clear and moist.  Eyes: EOM are normal. Pupils are equal, round, and reactive to light.  Neck: Normal range of motion. Neck supple.  Cardiovascular: Normal rate and regular rhythm.   Pulmonary/Chest: Effort normal and  breath sounds normal. No respiratory distress. She has no wheezes. She has no rales. She exhibits tenderness (mild left-sided tenderness especially in the parasternal region. There is no crepitance or deformity.).  Abdominal: Soft. Bowel sounds are normal. She exhibits no distension and no mass. There is no tenderness. There is no rebound and no guarding.  Musculoskeletal: Normal range of motion. She  exhibits no edema or tenderness.  No lower extremity swelling or asymmetry. Distal pulses equal and intact.  Neurological: She is alert and oriented to person, place, and time.  Moves all extremities without deficit. Sensation grossly intact.  Skin: Skin is warm and dry. No rash noted. No erythema.  Psychiatric: She has a normal mood and affect. Her behavior is normal.  Nursing note and vitals reviewed.   ED Course  Procedures (including critical care time) Labs Review Labs Reviewed  BASIC METABOLIC PANEL - Abnormal; Notable for the following:    Sodium 133 (*)    Glucose, Bld 102 (*)    Calcium 8.8 (*)    All other components within normal limits  CBC  TROPONIN I  D-DIMER, QUANTITATIVE (NOT AT [facility])  Randolm Idol, ED    Imaging Review Dg Chest 2 View  05/06/2016  CLINICAL DATA:  35 year old female with chest pain EXAM: CHEST  2 VIEW COMPARISON:  Prior chest x-ray 06/20/2014 FINDINGS: The lungs are clear and negative for focal airspace consolidation, pulmonary edema or suspicious pulmonary nodule. No pleural effusion or pneumothorax. Cardiac and mediastinal contours are within normal limits. No acute fracture or lytic or blastic osseous lesions. The visualized upper abdominal bowel gas pattern is unremarkable. IMPRESSION: Negative chest x-ray Electronically Signed   By: Jacqulynn Cadet M.D.   On: 05/06/2016 16:50   I have personally reviewed and evaluated these images and lab results as part of my medical decision-making.   EKG Interpretation   Date/Time:  Monday May 06 2016 16:00:20 EDT Ventricular Rate:  80 PR Interval:  162 QRS Duration: 86 QT Interval:  372 QTC Calculation: 429 R Axis:   70 Text Interpretation:  Normal sinus rhythm Normal ECG Confirmed by  Lita Mains  MD, Tanessa Tidd ([id number]) on 05/06/2016 7:46:03 PM      MDM   Final diagnoses:  Atypical chest pain  Rapid palpitations   Chest x-ray without any obvious abnormalities. EKG without ischemia. Initial  troponin is normal. Mild elevation in d-dimer. We'll get CT angios chest and repeat troponin. Patient is low risk for coronary artery disease. History would be very atypical. Signed out to oncoming emergency physician pending CT and second troponin. Anticipate discharge home.     Julianne Rice, MD 05/07/16 [phone]

## 2016-05-06 NOTE — ED Notes (Signed)
Troponin result 0.02

## 2016-05-06 NOTE — Discharge Instructions (Signed)
Nonspecific Chest Pain  Chest pain can be caused by many different conditions. There is always a chance that your pain could be related to something serious, such as a heart attack or a blood clot in your lungs. Chest pain can also be caused by conditions that are not life-threatening. If you have chest pain, it is very important to follow up with your health care provider. CAUSES  Chest pain can be caused by:  Heartburn.  Pneumonia or bronchitis.  Anxiety or stress.  Inflammation around your heart (pericarditis) or lung (pleuritis or pleurisy).  A blood clot in your lung.  A collapsed lung (pneumothorax). It can develop suddenly on its own (spontaneous pneumothorax) or from trauma to the chest.  Shingles infection (varicella-zoster virus).  Heart attack.  Damage to the bones, muscles, and cartilage that make up your chest wall. This can include:  Bruised bones due to injury.  Strained muscles or cartilage due to frequent or repeated coughing or overwork.  Fracture to one or more ribs.  Sore cartilage due to inflammation (costochondritis). RISK FACTORS  Risk factors for chest pain may include:  Activities that increase your risk for trauma or injury to your chest.  Respiratory infections or conditions that cause frequent coughing.  Medical conditions or overeating that can cause heartburn.  Heart disease or family history of heart disease.  Conditions or health behaviors that increase your risk of developing a blood clot.  Having had chicken pox (varicella zoster). SIGNS AND SYMPTOMS Chest pain can feel like:  Burning or tingling on the surface of your chest or deep in your chest.  Crushing, pressure, aching, or squeezing pain.  Dull or sharp pain that is worse when you move, cough, or take a deep breath.  Pain that is also felt in your back, neck, shoulder, or arm, or pain that spreads to any of these areas. Your chest pain may come and go, or it may stay  constant. DIAGNOSIS Lab tests or other studies may be needed to find the cause of your pain. Your health care provider may have you take a test called an ambulatory ECG (electrocardiogram). An ECG records your heartbeat patterns at the time the test is performed. You may also have other tests, such as:  Transthoracic echocardiogram (TTE). During echocardiography, sound waves are used to create a picture of all of the heart structures and to look at how blood flows through your heart.  Transesophageal echocardiogram (TEE).This is a more advanced imaging test that obtains images from inside your body. It allows your health care provider to see your heart in finer detail.  Cardiac monitoring. This allows your health care provider to monitor your heart rate and rhythm in real time.  Holter monitor. This is a portable device that records your heartbeat and can help to diagnose abnormal heartbeats. It allows your health care provider to track your heart activity for several days, if needed.  Stress tests. These can be done through exercise or by taking medicine that makes your heart beat more quickly.  Blood tests.  Imaging tests. TREATMENT  Your treatment depends on what is causing your chest pain. Treatment may include:  Medicines. These may include:  Acid blockers for heartburn.  Anti-inflammatory medicine.  Pain medicine for inflammatory conditions.  Antibiotic medicine, if an infection is present.  Medicines to dissolve blood clots.  Medicines to treat coronary artery disease.  Supportive care for conditions that do not require medicines. This may include:  Resting.  Applying heat  or cold packs to injured areas.  Limiting activities until pain decreases. HOME CARE INSTRUCTIONS  If you were prescribed an antibiotic medicine, finish it all even if you start to feel better.  Avoid any activities that bring on chest pain.  Do not use any tobacco products, including  cigarettes, chewing tobacco, or electronic cigarettes. If you need help quitting, ask your health care provider.  Do not drink alcohol.  Take medicines only as directed by your health care provider.  Keep all follow-up visits as directed by your health care provider. This is important. This includes any further testing if your chest pain does not go away.  If heartburn is the cause for your chest pain, you may be told to keep your head raised (elevated) while sleeping. This reduces the chance that acid will go from your stomach into your esophagus.  Make lifestyle changes as directed by your health care provider. These may include:  Getting regular exercise. Ask your health care provider to suggest some activities that are safe for you.  Eating a heart-healthy diet. A registered dietitian can help you to learn healthy eating options.  Maintaining a healthy weight.  Managing diabetes, if necessary.  Reducing stress. SEEK MEDICAL CARE IF:  Your chest pain does not go away after treatment.  You have a rash with blisters on your chest.  You have a fever. SEEK IMMEDIATE MEDICAL CARE IF:   Your chest pain is worse.  You have an increasing cough, or you cough up blood.  You have severe abdominal pain.  You have severe weakness.  You faint.  You have chills.  You have sudden, unexplained chest discomfort.  You have sudden, unexplained discomfort in your arms, back, neck, or jaw.  You have shortness of breath at any time.  You suddenly start to sweat, or your skin gets clammy.  You feel nauseous or you vomit.  You suddenly feel light-headed or dizzy.  Your heart begins to beat quickly, or it feels like it is skipping beats. These symptoms may represent a serious problem that is an emergency. Do not wait to see if the symptoms will go away. Get medical help right away. Call your local emergency services (911 in the [place] Do not drive yourself to the hospital.   This  information is not intended to replace advice given to you by your health care provider. Make sure you discuss any questions you have with your health care provider.   Document Released: 07/17/2005 Document Revised: 10/28/2014 Document Reviewed: 05/13/2014 Elsevier Interactive Patient Education 2016 [employer].  Palpitations A palpitation is the feeling that your heartbeat is irregular or is faster than normal. It may feel like your heart is fluttering or skipping a beat. Palpitations are usually not a serious problem. However, in some cases, you may need further medical evaluation. CAUSES  Palpitations can be caused by:  Smoking.  Caffeine or other stimulants, such as diet pills or energy drinks.  Alcohol.  Stress and anxiety.  Strenuous physical activity.  Fatigue.  Certain medicines.  Heart disease, especially if you have a history of irregular heart rhythms (arrhythmias), such as atrial fibrillation, atrial flutter, or supraventricular tachycardia.  An improperly working pacemaker or defibrillator. DIAGNOSIS  To find the cause of your palpitations, your health care provider will take your medical history and perform a physical exam. Your health care provider may also have you take a test called an ambulatory electrocardiogram (ECG). An ECG records your heartbeat patterns over a 24-hour  period. You may also have other tests, such as:  Transthoracic echocardiogram (TTE). During echocardiography, sound waves are used to evaluate how blood flows through your heart.  Transesophageal echocardiogram (TEE).  Cardiac monitoring. This allows your health care provider to monitor your heart rate and rhythm in real time.  Holter monitor. This is a portable device that records your heartbeat and can help diagnose heart arrhythmias. It allows your health care provider to track your heart activity for several days, if needed.  Stress tests by exercise or by giving medicine that makes the  heart beat faster. TREATMENT  Treatment of palpitations depends on the cause of your symptoms and can vary greatly. Most cases of palpitations do not require any treatment other than time, relaxation, and monitoring your symptoms. Other causes, such as atrial fibrillation, atrial flutter, or supraventricular tachycardia, usually require further treatment. HOME CARE INSTRUCTIONS   Avoid:  Caffeinated coffee, tea, soft drinks, diet pills, and energy drinks.  Chocolate.  Alcohol.  Stop smoking if you smoke.  Reduce your stress and anxiety. Things that can help you relax include:  A method of controlling things in your body, such as your heartbeats, with your mind (biofeedback).  Yoga.  Meditation.  Physical activity such as swimming, jogging, or walking.  Get plenty of rest and sleep. SEEK MEDICAL CARE IF:   You continue to have a fast or irregular heartbeat beyond 24 hours.  Your palpitations occur more often. SEEK IMMEDIATE MEDICAL CARE IF:  You have chest pain or shortness of breath.  You have a severe headache.  You feel dizzy or you faint. MAKE SURE YOU:  Understand these instructions.  Will watch your condition.  Will get help right away if you are not doing well or get worse.   This information is not intended to replace advice given to you by your health care provider. Make sure you discuss any questions you have with your health care provider.   Document Released: 10/04/2000 Document Revised: 10/12/2013 Document Reviewed: 12/06/2011 Elsevier Interactive Patient Education [employer].

## 2016-05-06 NOTE — ED Notes (Signed)
Pt reports chest pain that began on Saturday when she was at the water park with her children,. She describes pain as crampy, but has taken no OTC meds for relief. She reports that pain as comes and goes. Her labs are resulted and she has returned from radiology

## 2016-05-07 ENCOUNTER — Encounter: Payer: Self-pay | Admitting: Family Medicine

## 2016-05-07 ENCOUNTER — Ambulatory Visit (INDEPENDENT_AMBULATORY_CARE_PROVIDER_SITE_OTHER): Payer: Managed Care, Other (non HMO) | Admitting: Family Medicine

## 2016-05-07 VITALS — BP 130/82 | HR 88 | Temp 98.2°F | Resp 14 | Ht 65.0 in | Wt 222.0 lb

## 2016-05-07 DIAGNOSIS — R079 Chest pain, unspecified: Secondary | ICD-10-CM | POA: Diagnosis not present

## 2016-05-07 NOTE — Progress Notes (Signed)
Patient ID: Miranda Wade, female   DOB: December 31, 1980, 35 y.o.   MRN: [id number]   Subjective:    Patient ID: Miranda Wade, female    DOB: 1981-03-31, 35 y.o.   MRN: [id number]  Patient presents for ER F/U  Pt seen in ER last night with chest pain palpitations.    Labs were unremarkable, minimally low Caclium at 8.8  which was unchanged from 1 week ago, NA at 133, note recently started HCTZ added to her losartan for BP.   Today she was at a water park she went down large sliding on port she had some chest discomfort. 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 It went away after a few seconds. It occurred again yesterday while she was at work around 2:30 in the afternoon and therefore she went to the emergency room. EKG was normal troponin 2 were normal d-dimer was negative chest x-ray was normal she did get some relief of her chest pain with Toradol injection in the emergency room She is described ibuprofen for chest wall pain she is having some discomfort right now    Review Of Systems:  GEN- denies fatigue, fever, weight loss,weakness, recent illness HEENT- denies eye drainage, change in vision, nasal discharge, CVS- +chest pain, denies  palpitations RESP- denies SOB, cough, wheeze ABD- denies N/V, change in stools, abd pain GU- denies dysuria, hematuria, dribbling, incontinence MSK- denies joint pain, muscle aches, injury Neuro- denies headache, dizziness, syncope, seizure activity       Objective:    BP 130/82 mmHg  Pulse 88  Temp(Src) 98.2 F (36.8 C) (Oral)  Resp 14  Ht 5\' 5"  (1.651 m)  Wt 222 lb (100.699 kg)  BMI 36.94 kg/m2 GEN- NAD, alert and oriented x3 HEENT- PERRL, EOMI, non injected sclera, pink conjunctiva, MMM, oropharynx clear CVS- RRR, systolic murmur RSB  , TTP Left chest wall RESP-CTAB ABD-NABS,soft,NT,ND EXT- No edema Pulses- Radial  2+  EKG- NSR , no  ST changes       Assessment & Plan:      Problem List Items Addressed This Visit    None    Visit Diagnoses    Chest pain, unspecified chest pain type    -  Primary    Relevant Orders    EKG 12-Lead (Completed)       Note: This dictation was prepared with Dragon dictation along with smaller phrase technology. Any transcriptional errors that result from this process are unintentional.

## 2016-05-07 NOTE — Patient Instructions (Addendum)
Continue the motrin as needed with food Also try the zantac to coat your stomach  Echo is still being scheduled for your heart  F/U as previous

## 2016-05-09 ENCOUNTER — Ambulatory Visit (HOSPITAL_COMMUNITY)
Admission: RE | Admit: 2016-05-09 | Discharge: 2016-05-09 | Disposition: A | Discharge status: Home / Self Care | Payer: Managed Care, Other (non HMO) | Source: Ambulatory Visit | Attending: Family Medicine | Admitting: Family Medicine

## 2016-05-09 DIAGNOSIS — R011 Cardiac murmur, unspecified: Secondary | ICD-10-CM | POA: Diagnosis not present

## 2016-05-09 DIAGNOSIS — E785 Hyperlipidemia, unspecified: Secondary | ICD-10-CM | POA: Diagnosis not present

## 2016-05-09 DIAGNOSIS — I340 Nonrheumatic mitral (valve) insufficiency: Secondary | ICD-10-CM | POA: Insufficient documentation

## 2016-05-09 DIAGNOSIS — I10 Essential (primary) hypertension: Secondary | ICD-10-CM | POA: Diagnosis not present

## 2016-05-09 DIAGNOSIS — I119 Hypertensive heart disease without heart failure: Secondary | ICD-10-CM | POA: Insufficient documentation

## 2016-05-09 DIAGNOSIS — E669 Obesity, unspecified: Secondary | ICD-10-CM | POA: Insufficient documentation

## 2016-05-09 DIAGNOSIS — I071 Rheumatic tricuspid insufficiency: Secondary | ICD-10-CM | POA: Insufficient documentation

## 2016-05-09 DIAGNOSIS — Z6836 Body mass index (BMI) 36.0-36.9, adult: Secondary | ICD-10-CM | POA: Diagnosis not present

## 2016-05-09 LAB — ECHOCARDIOGRAM COMPLETE
AO mean calculated velocity dopler: 123 cm/s
AOPV: 0.76 m/s
AOVTI: 38.9 cm
AV Area VTI index: 0.96 cm2/m2
AV Area VTI: 2.39 cm2
AV Area mean vel: 2.19 cm2
AV Peak grad: 13 mmHg
AV area mean vel ind: 1 cm2/m2
AV peak Index: 1.09
AVA: 2.1 cm2
AVCELMEANRAT: 0.7
AVG: 7 mmHg
AVLVOTPG: 8 mmHg
AVPKVEL: 183 cm/s
CHL CUP AV VEL: 2.1
CHL CUP STROKE VOLUME: 48 mL
E decel time: 264 msec
E/e' ratio: 9.92
FS: 39 % (ref 28–44)
IVS/LV PW RATIO, ED: 1
LA ID, A-P, ES: 34 mm
LA vol index: 28.3 mL/m2
LADIAMINDEX: 1.55 cm/m2
LAVOL: 62.2 mL
LAVOLA4C: 65.1 mL
LDCA: 3.14 cm2
LEFT ATRIUM END SYS DIAM: 34 mm
LV SIMPSON'S DISK: 64
LV TDI E'LATERAL: 9.9
LV TDI E'MEDIAL: 7.07
LV dias vol index: 34 mL/m2
LV e' LATERAL: 9.9 cm/s
LVDIAVOL: 76 mL (ref 46–106)
LVEEAVG: 9.92
LVEEMED: 9.92
LVOT SV: 82 mL
LVOT VTI: 26 cm
LVOT diameter: 20 mm
LVOT peak VTI: 0.67 cm
LVOTPV: 139 cm/s
LVSYSVOL: 27 mL (ref 14–42)
LVSYSVOLIN: 12 mL/m2
MV Dec: 264
MV Peak grad: 4 mmHg
MVPKAVEL: 71.4 m/s
MVPKEVEL: 98.2 m/s
PW: 11.1 mm — AB (ref 0.6–1.1)
RV LATERAL S' VELOCITY: 13.6 cm/s
TAPSE: 23 mm
Valve area index: 0.96

## 2016-05-09 NOTE — Progress Notes (Signed)
*  PRELIMINARY RESULTS* Echocardiogram 2D Echocardiogram has been performed.  Miranda Wade 05/09/2016, 8:56 AM

## 2016-05-10 ENCOUNTER — Telehealth: Payer: Self-pay | Admitting: Family Medicine

## 2016-05-10 NOTE — Telephone Encounter (Signed)
Patient calling to get results of her echocardiogram  Please call her at [phone]

## 2016-05-10 NOTE — Telephone Encounter (Signed)
Discussed Echocardiogram results with pt per provider recommendations.

## 2016-05-31 ENCOUNTER — Ambulatory Visit (INDEPENDENT_AMBULATORY_CARE_PROVIDER_SITE_OTHER): Payer: Managed Care, Other (non HMO) | Admitting: Family Medicine

## 2016-05-31 ENCOUNTER — Encounter: Payer: Self-pay | Admitting: Family Medicine

## 2016-05-31 VITALS — BP 126/92 | HR 74 | Resp 16 | Ht 65.0 in | Wt 221.0 lb

## 2016-05-31 DIAGNOSIS — I10 Essential (primary) hypertension: Secondary | ICD-10-CM

## 2016-05-31 MED ORDER — LOSARTAN POTASSIUM 50 MG PO TABS: 50.0000 mg | ORAL_TABLET | Freq: Every day | ORAL | 2 refills | Status: DC

## 2016-05-31 MED ORDER — HYDROCHLOROTHIAZIDE 25 MG PO TABS: 25.0000 mg | ORAL_TABLET | Freq: Every day | ORAL | 3 refills | Status: DC

## 2016-05-31 NOTE — Progress Notes (Signed)
   Subjective:    Patient ID: Miranda Wade, female    DOB: 03-Jan-1981, 35 y.o.   MRN: [id number]  Patient presents for Follow-up (blood pressure check ) Here for interim f/u on HTN. HCTZ 12.5mg  was added to her losartan for better blood pressure control. Also noted to have functional systolic murmur and mild enlarged heart  on 2D Echo  Also seen in interim for Chest pain, non cardiac after work up on 7./18 after ER visit  Her BP has still been running a little elevated mostly her diastolic is been in the 0000000 his systolic is been in the [id number]  Review Of Systems:  GEN- denies fatigue, fever, weight loss,weakness, recent illness HEENT- denies eye drainage, change in vision, nasal discharge, CVS- denies chest pain, palpitations RESP- denies SOB, cough, wheeze ABD- denies N/V, change in stools, abd pain Neuro- denies headache, dizziness, syncope, seizure activity       Objective:    BP (!) 126/92   Pulse 74   Resp 16   Ht 5\' 5"  (1.651 m)   Wt 221 lb (100.2 kg)   SpO2 98%   BMI 36.78 kg/m  GEN- NAD, alert and oriented x3 HEENT- PERRL, EOMI, non injected sclera, pink conjunctiva, MMM, oropharynx clear CVS- RRR,  systolic murmur RSB  RESP-CTAB EXT- No edema Pulses- Radial  2+      Assessment & Plan:      Problem List Items Addressed This Visit    Essential hypertension, benign - Primary    Diastolic blood pressure still elevated. We'll increase her hydrochlorothiazide to 25 mg continue losartan 50 mg she will continue monitoring her blood pressure at home. We discussed dietary changes as well as need for weight loss which will also help her blood pressure considerably. Reviewed her echocardiogram again. With a mild enlargement keeping her blood pressure under good control and her weight is very significant      Relevant Medications   hydrochlorothiazide (HYDRODIURIL) 25 MG tablet   losartan (COZAAR) 50 MG tablet    Other Visit Diagnoses   None.     Note: This  dictation was prepared with Dragon dictation along with smaller phrase technology. Any transcriptional errors that result from this process are unintentional.

## 2016-05-31 NOTE — Patient Instructions (Signed)
HCTZ changed to 25mg  - take 2 of the 12.5mg  until you run out COntinue losartan once a day  F/U 4 months

## 2016-05-31 NOTE — Assessment & Plan Note (Signed)
Diastolic blood pressure still elevated. We'll increase her hydrochlorothiazide to 25 mg continue losartan 50 mg she will continue monitoring her blood pressure at home. We discussed dietary changes as well as need for weight loss which will also help her blood pressure considerably. Reviewed her echocardiogram again. With a mild enlargement keeping her blood pressure under good control and her weight is very significant

## 2016-06-25 ENCOUNTER — Telehealth: Payer: Self-pay | Admitting: Family Medicine

## 2016-06-25 MED ORDER — LOSARTAN POTASSIUM 50 MG PO TABS: 50.0000 mg | ORAL_TABLET | Freq: Every day | ORAL | 2 refills | Status: DC

## 2016-06-25 NOTE — Telephone Encounter (Signed)
Patient is calling to say that she needs her losartan refilled, but she is on file at the pharmacy as having home delivery, she needs to be opted out of this so she can pick this up at the pharmacy  [phone]    [facility] [place]

## 2016-06-25 NOTE — Telephone Encounter (Signed)
Prescription sent to pharmacy.   Patient will need to contact pharmacy to have home delivery stopped.   Call placed to patient and patient made aware.

## 2016-06-28 ENCOUNTER — Telehealth: Payer: Self-pay | Admitting: Family Medicine

## 2016-06-28 MED ORDER — LOSARTAN POTASSIUM 50 MG PO TABS: 50.0000 mg | ORAL_TABLET | Freq: Every day | ORAL | 2 refills | Status: DC

## 2016-06-28 NOTE — Telephone Encounter (Signed)
Prior refill sent to [facility] should have refills on it.   Prescription sent to mail order pharmacy.

## 2016-06-28 NOTE — Telephone Encounter (Signed)
CB# [id number]  Patient called today saying that her insurance is requiring her to get home delivery from [facility] and their number is [phone]. She is out of her losartan and they say that it will take her at least 10 days after we send in the prescription so she will need to pick up a few pills from [place].  Patient is requesting a 90 day supply.

## 2016-10-01 ENCOUNTER — Ambulatory Visit: Payer: Managed Care, Other (non HMO) | Admitting: Family Medicine

## 2016-10-18 ENCOUNTER — Encounter (HOSPITAL_COMMUNITY): Payer: Self-pay | Admitting: Emergency Medicine

## 2016-10-18 ENCOUNTER — Emergency Department (HOSPITAL_COMMUNITY)
Admission: EM | Admit: 2016-10-18 | Discharge: 2016-10-18 | Disposition: A | Discharge status: Home / Self Care | Payer: Self-pay | Attending: Emergency Medicine | Admitting: Emergency Medicine

## 2016-10-18 DIAGNOSIS — Z79899 Other long term (current) drug therapy: Secondary | ICD-10-CM | POA: Insufficient documentation

## 2016-10-18 DIAGNOSIS — R112 Nausea with vomiting, unspecified: Secondary | ICD-10-CM

## 2016-10-18 DIAGNOSIS — Z791 Long term (current) use of non-steroidal anti-inflammatories (NSAID): Secondary | ICD-10-CM | POA: Insufficient documentation

## 2016-10-18 DIAGNOSIS — I10 Essential (primary) hypertension: Secondary | ICD-10-CM | POA: Insufficient documentation

## 2016-10-18 DIAGNOSIS — E876 Hypokalemia: Secondary | ICD-10-CM

## 2016-10-18 DIAGNOSIS — R197 Diarrhea, unspecified: Secondary | ICD-10-CM

## 2016-10-18 DIAGNOSIS — Z87891 Personal history of nicotine dependence: Secondary | ICD-10-CM | POA: Insufficient documentation

## 2016-10-18 LAB — COMPREHENSIVE METABOLIC PANEL
ALT: 14 U/L (ref 14–54)
ANION GAP: 6 (ref 5–15)
AST: 17 U/L (ref 15–41)
Albumin: 3.6 g/dL (ref 3.5–5.0)
Alkaline Phosphatase: 63 U/L (ref 38–126)
BILIRUBIN TOTAL: 0.6 mg/dL (ref 0.3–1.2)
BUN: 10 mg/dL (ref 6–20)
CHLORIDE: 102 mmol/L (ref 101–111)
CO2: 25 mmol/L (ref 22–32)
Calcium: 8.2 mg/dL — ABNORMAL LOW (ref 8.9–10.3)
Creatinine, Ser: 0.7 mg/dL (ref 0.44–1.00)
Glucose, Bld: 117 mg/dL — ABNORMAL HIGH (ref 65–99)
POTASSIUM: 2.8 mmol/L — AB (ref 3.5–5.1)
Sodium: 133 mmol/L — ABNORMAL LOW (ref 135–145)
TOTAL PROTEIN: 7.8 g/dL (ref 6.5–8.1)

## 2016-10-18 LAB — URINALYSIS, ROUTINE W REFLEX MICROSCOPIC
BILIRUBIN URINE: NEGATIVE
GLUCOSE, UA: NEGATIVE mg/dL
KETONES UR: NEGATIVE mg/dL
LEUKOCYTES UA: NEGATIVE
NITRITE: NEGATIVE
PH: 5 (ref 5.0–8.0)
PROTEIN: 30 mg/dL — AB
Specific Gravity, Urine: 1.03 (ref 1.005–1.030)

## 2016-10-18 LAB — CBC
HEMATOCRIT: 35.7 % — AB (ref 36.0–46.0)
Hemoglobin: 11.9 g/dL — ABNORMAL LOW (ref 12.0–15.0)
MCH: 27.4 pg (ref 26.0–34.0)
MCHC: 33.3 g/dL (ref 30.0–36.0)
MCV: 82.1 fL (ref 78.0–100.0)
Platelets: 308 10*3/uL (ref 150–400)
RBC: 4.35 MIL/uL (ref 3.87–5.11)
RDW: 14.8 % (ref 11.5–15.5)
WBC: 8.3 10*3/uL (ref 4.0–10.5)

## 2016-10-18 LAB — LIPASE, BLOOD: Lipase: 20 U/L (ref 11–51)

## 2016-10-18 MED ORDER — PROMETHAZINE HCL 25 MG RE SUPP: 25.0000 mg | Freq: Four times a day (QID) | RECTAL | 0 refills | Status: DC | PRN

## 2016-10-18 MED ORDER — POTASSIUM CHLORIDE CRYS ER 20 MEQ PO TBCR: 40.0000 meq | EXTENDED_RELEASE_TABLET | Freq: Every day | ORAL | 0 refills | Status: DC

## 2016-10-18 MED ORDER — POTASSIUM CHLORIDE CRYS ER 20 MEQ PO TBCR
40.0000 meq | EXTENDED_RELEASE_TABLET | Freq: Once | ORAL | Status: AC
  Administered 2016-10-18: 40 meq via ORAL
  Filled 2016-10-18: qty 2

## 2016-10-18 MED ORDER — ONDANSETRON 4 MG PO TBDP: ORAL_TABLET | ORAL | 0 refills | Status: DC

## 2016-10-18 NOTE — ED Notes (Signed)
ED Provider at bedside. 

## 2016-10-18 NOTE — ED Triage Notes (Signed)
Pt vomiting and diarrhea, onset last night. Pt works in ED in [place]

## 2016-10-18 NOTE — ED Provider Notes (Signed)
[facility] DEPT Provider Note   CSN: [id number] Arrival date & time: 10/18/16  1237     History   Chief Complaint Chief Complaint  Patient presents with  . Emesis    HPI Miranda Wade is a 35 y.o. female.   Emesis   This is a new problem. The current episode started yesterday. The problem occurs 5 to 10 times per day. The problem has been gradually improving. The emesis has an appearance of stomach contents. There has been no fever. Associated symptoms include abdominal pain and diarrhea.    Past Medical History:  Diagnosis Date  . Allergy    seasonal  . Hypercholesteremia   . Hypertension     Patient Active Problem List   Diagnosis Date Noted  . Essential hypertension, benign 04/30/2016  . Heart murmur 04/30/2016  . Obesity 04/30/2016  . Hyperlipidemia 04/30/2016  . Urinary frequency 04/30/2016    History reviewed. No pertinent surgical history.  OB History    No data available       Home Medications    Prior to Admission medications   Medication Sig Start Date End Date Taking? Authorizing Provider  etonogestrel (NEXPLANON) 68 MG IMPL implant 1 each by Subdermal route once.    Historical Provider, MD  hydrochlorothiazide (HYDRODIURIL) 25 MG tablet Take 1 tablet (25 mg total) by mouth daily. 05/31/16   Alycia Rossetti, MD  ibuprofen (ADVIL,MOTRIN) 600 MG tablet Take 1 tablet (600 mg total) by mouth 3 (three) times daily with meals as needed. 05/06/16   Julianne Rice, MD  losartan (COZAAR) 50 MG tablet Take 1 tablet (50 mg total) by mouth daily. 06/28/16   Alycia Rossetti, MD  ondansetron (ZOFRAN ODT) 4 MG disintegrating tablet 4mg  ODT q4 hours prn nausea/vomit 10/18/16   [name], MD  potassium chloride SA (K-DUR,KLOR-CON) 20 MEQ tablet Take 2 tablets (40 mEq total) by mouth daily. 10/18/16   [name], MD  promethazine (PHENERGAN) 25 MG suppository Place 1 suppository (25 mg total) rectally every 6 (six) hours as needed for nausea or vomiting.  10/18/16   [name], MD    Family History Family History  Problem Relation Age of Onset  . Diabetes Mother   . Hypertension Mother   . Arthritis Mother   . Hyperlipidemia Mother   . Stroke Mother   . Hypertension Father   . Heart disease Father     valve replacement  . Hyperlipidemia Father   . Asthma Daughter     Social History Social History  Substance Use Topics  . Smoking status: Former Smoker    Packs/day: 0.50  . Smokeless tobacco: Never Used  . Alcohol use No     Allergies   Patient has no known allergies.   Review of Systems Review of Systems  Gastrointestinal: Positive for abdominal pain, diarrhea and vomiting.  All other systems reviewed and are negative.    Physical Exam Updated Vital Signs BP 146/83 (BP Location: Left Arm)   Pulse 84   Temp 99 F (37.2 C) (Oral)   Resp 20   Ht 5\' 5"  (1.651 m)   Wt 220 lb (99.8 kg)   SpO2 100%   BMI 36.61 kg/m   Physical Exam  Constitutional: She appears well-developed and well-nourished.  HENT:  Head: Normocephalic and atraumatic.  Eyes: Conjunctivae and EOM are normal.  Neck: Normal range of motion.  Cardiovascular: Normal rate and regular rhythm.   Pulmonary/Chest: No stridor. No respiratory distress.  Abdominal: Soft. She  exhibits no distension.  Neurological: She is alert.  Skin: Skin is warm and dry.  Nursing note and vitals reviewed.    ED Treatments / Results  Labs (all labs ordered are listed, but only abnormal results are displayed) Labs Reviewed  COMPREHENSIVE METABOLIC PANEL - Abnormal; Notable for the following:       Result Value   Sodium 133 (*)    Potassium 2.8 (*)    Glucose, Bld 117 (*)    Calcium 8.2 (*)    All other components within normal limits  CBC - Abnormal; Notable for the following:    Hemoglobin 11.9 (*)    HCT 35.7 (*)    All other components within normal limits  URINALYSIS, ROUTINE W REFLEX MICROSCOPIC - Abnormal; Notable for the following:    APPearance  HAZY (*)    Hgb urine dipstick MODERATE (*)    Protein, ur 30 (*)    Bacteria, UA FEW (*)    All other components within normal limits  LIPASE, BLOOD    EKG  EKG Interpretation None       Radiology No results found.  Procedures Procedures (including critical care time)  Medications Ordered in ED Medications  potassium chloride SA (K-DUR,KLOR-CON) CR tablet 40 mEq (40 mEq Oral Given 10/18/16 1636)     Initial Impression / Assessment and Plan / ED Course  I have reviewed the triage vital signs and the nursing notes.  Pertinent labs & imaging results that were available during my care of the patient were reviewed by me and considered in my medical decision making (see chart for details).  Clinical Course     Likely gastroenteritis with mild hypokalemia. No e/o dehydration. Tolerating PO while in waiting room and still drinking ginger ale now without problems. Abdomen benign, doubt any emergent intraabdominal processes.   Final Clinical Impressions(s) / ED Diagnoses   Final diagnoses:  Nausea and vomiting, intractability of vomiting not specified, unspecified vomiting type  Diarrhea, unspecified type  Hypokalemia    New Prescriptions Discharge Medication List as of 10/18/2016  4:31 PM    START taking these medications   Details  ondansetron (ZOFRAN ODT) 4 MG disintegrating tablet 4mg  ODT q4 hours prn nausea/vomit, Print    potassium chloride SA (K-DUR,KLOR-CON) 20 MEQ tablet Take 2 tablets (40 mEq total) by mouth daily., Starting Fri 10/18/2016, Print    promethazine (PHENERGAN) 25 MG suppository Place 1 suppository (25 mg total) rectally every 6 (six) hours as needed for nausea or vomiting., Starting Fri 10/18/2016, Print         [name], MD 10/19/16 1110

## 2016-10-22 ENCOUNTER — Telehealth: Payer: Self-pay | Admitting: Family Medicine

## 2016-10-22 NOTE — Telephone Encounter (Signed)
Agree 

## 2016-10-22 NOTE — Telephone Encounter (Signed)
Seen 10/18/16 at ED for nausea/voming.  "stomach bug"  Found to have potassium level very low 2.8.  Given Rx for potassium supplements. Was unable to fill until yesterday due to insurance.  Unable to make appt for follow up until Thursday.  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  Needs note.  Is off Wed-Thur-Fri.  Pt told can give note on Thursday when come for appt.  She said may need meds adjusted.  Continue to take BP med and potassium until appt. And provider will discuss with her then.

## 2016-10-24 ENCOUNTER — Encounter: Payer: Self-pay | Admitting: Physician Assistant

## 2016-10-24 ENCOUNTER — Ambulatory Visit (INDEPENDENT_AMBULATORY_CARE_PROVIDER_SITE_OTHER): Payer: 59 | Admitting: Physician Assistant

## 2016-10-24 VITALS — BP 118/78 | HR 79 | Temp 98.1°F | Resp 16 | Wt 213.0 lb

## 2016-10-24 DIAGNOSIS — E876 Hypokalemia: Secondary | ICD-10-CM

## 2016-10-24 DIAGNOSIS — A084 Viral intestinal infection, unspecified: Secondary | ICD-10-CM | POA: Diagnosis not present

## 2016-10-24 LAB — BASIC METABOLIC PANEL WITH GFR
BUN: 11 mg/dL (ref 7–25)
CHLORIDE: 102 mmol/L (ref 98–110)
CO2: 22 mmol/L (ref 20–31)
Calcium: 8.7 mg/dL (ref 8.6–10.2)
Creat: 0.67 mg/dL (ref 0.50–1.10)
GFR, Est African American: >89 mL/min (ref >=60)
GFR, Est Non African American: >89 mL/min (ref >=60)
Glucose, Bld: 90 mg/dL (ref 70–99)
POTASSIUM: 3.5 mmol/L (ref 3.5–5.3)
SODIUM: 137 mmol/L (ref 135–146)

## 2016-10-24 NOTE — Progress Notes (Signed)
Patient ID: Miranda Wade MRN: [id number], DOB: 10-Jun-1981, 36 y.o. Date of Encounter: 10/24/2016, 8:27 AM    Chief Complaint:  Chief Complaint  Patient presents with  . Potassium low    F/u from ER     HPI: 36 y.o. year old female presents with above.   I reviewed her ER note from 10/18/16. She presented with vomiting that had started the prior day. Was vomiting 5-10 times per day. Also was having some abdominal pain and diarrhea. Labs were notable for potassium 2.8. Was felt that she had likely gastroenteritis with hypokalemia. She was tolerating by mouth and drinking ginger ale without problems. Abdomen benign no indication of an emergent intra-abdominal process. She was discharged home with prescription of Zofran K-Dur 20 milliequivalent tablet take 2 daily for 40 mEq total. Also was given Phenergan suppository. Today she states that she was scheduled to work Monday and Tuesday but missed those days of work. Scheduled to be off Wednesday and Thursday. Scheduled to return Friday. Today she states that vomiting stopped first. The diarrhea continued until Monday. She has had no vomiting or diarrhea Tuesday or Wednesday or this morning. Says that she still feels weak and tired. No other complaints or concerns to address today.     Home Meds:   Outpatient Medications Prior to Visit  Medication Sig Dispense Refill  . etonogestrel (NEXPLANON) 68 MG IMPL implant 1 each by Subdermal route once.    . hydrochlorothiazide (HYDRODIURIL) 25 MG tablet Take 1 tablet (25 mg total) by mouth daily. 90 tablet 3  . ibuprofen (ADVIL,MOTRIN) 600 MG tablet Take 1 tablet (600 mg total) by mouth 3 (three) times daily with meals as needed. 30 tablet 0  . losartan (COZAAR) 50 MG tablet Take 1 tablet (50 mg total) by mouth daily. 90 tablet 2  . ondansetron (ZOFRAN ODT) 4 MG disintegrating tablet 4mg  ODT q4 hours prn nausea/vomit 30 tablet 0  . potassium chloride SA (K-DUR,KLOR-CON) 20 MEQ tablet Take 2  tablets (40 mEq total) by mouth daily. 30 tablet 0  . promethazine (PHENERGAN) 25 MG suppository Place 1 suppository (25 mg total) rectally every 6 (six) hours as needed for nausea or vomiting. 12 each 0   No facility-administered medications prior to visit.     Allergies: No Known Allergies    Review of Systems: See HPI for pertinent ROS. All other ROS negative.    Physical Exam: Blood pressure 118/78, pulse 79, temperature 98.1 F (36.7 C), temperature source Oral, resp. rate 16, weight 213 lb (96.6 kg), SpO2 99 %., Body mass index is 35.45 kg/m. General:  AAF Appears in no acute distress. Neck: Supple. No thyromegaly. No lymphadenopathy. Lungs: Clear bilaterally to auscultation without wheezes, rales, or rhonchi. Breathing is unlabored. Heart: Regular rhythm. No murmurs, rubs, or gallops. Abdomen: Soft, non-tender, non-distended with normoactive bowel sounds. No hepatomegaly. No rebound/guarding. No obvious abdominal masses. Msk:  Strength and tone normal for age. Extremities/Skin: Warm and dry.  Neuro: Alert and oriented X 3. Moves all extremities spontaneously. Gait is normal. CNII-XII grossly in tact. Psych:  Responds to questions appropriately with a normal affect.     ASSESSMENT AND PLAN:  36 y.o. year old female with  1. Hypokalemia - BASIC METABOLIC PANEL WITH GFR  2. Viral gastroenteritis  She states that she was told to take the potassium 40 mEq daily until she had follow-up lab here within 1 week. Routine meds include HCTZ 25 mg daily and Cozaar 50 mg daily.  Don't know whether her low potassium was strictly secondary to the vomiting and diarrhea or wet weather cemented was secondary to her HCTZ use. Have to determine whether she needs to continue on potassium supplement once a good today's result and review her chart further.   Marin Olp [place], [place], [facility] 10/24/2016 8:27 AM

## 2017-01-27 ENCOUNTER — Encounter: Payer: Self-pay | Admitting: Family Medicine

## 2017-02-21 ENCOUNTER — Encounter: Payer: Self-pay | Admitting: Family Medicine

## 2017-04-11 ENCOUNTER — Other Ambulatory Visit: Payer: Self-pay

## 2017-04-11 ENCOUNTER — Ambulatory Visit (INDEPENDENT_AMBULATORY_CARE_PROVIDER_SITE_OTHER): Payer: Medicaid Other | Admitting: Family Medicine

## 2017-04-11 ENCOUNTER — Encounter: Payer: Self-pay | Admitting: Family Medicine

## 2017-04-11 VITALS — BP 118/70 | HR 91 | Temp 98.3°F | Resp 16 | Ht 64.5 in | Wt 221.4 lb

## 2017-04-11 DIAGNOSIS — Z0000 Encounter for general adult medical examination without abnormal findings: Secondary | ICD-10-CM | POA: Diagnosis not present

## 2017-04-11 DIAGNOSIS — Z6837 Body mass index (BMI) 37.0-37.9, adult: Secondary | ICD-10-CM

## 2017-04-11 DIAGNOSIS — J3489 Other specified disorders of nose and nasal sinuses: Secondary | ICD-10-CM | POA: Diagnosis not present

## 2017-04-11 DIAGNOSIS — I10 Essential (primary) hypertension: Secondary | ICD-10-CM

## 2017-04-11 DIAGNOSIS — Z124 Encounter for screening for malignant neoplasm of cervix: Secondary | ICD-10-CM

## 2017-04-11 DIAGNOSIS — L918 Other hypertrophic disorders of the skin: Secondary | ICD-10-CM | POA: Diagnosis not present

## 2017-04-11 DIAGNOSIS — E66812 Obesity, class 2: Secondary | ICD-10-CM

## 2017-04-11 DIAGNOSIS — E7800 Pure hypercholesterolemia, unspecified: Secondary | ICD-10-CM | POA: Diagnosis not present

## 2017-04-11 DIAGNOSIS — D229 Melanocytic nevi, unspecified: Secondary | ICD-10-CM

## 2017-04-11 DIAGNOSIS — N841 Polyp of cervix uteri: Secondary | ICD-10-CM | POA: Diagnosis not present

## 2017-04-11 DIAGNOSIS — G5602 Carpal tunnel syndrome, left upper limb: Secondary | ICD-10-CM | POA: Diagnosis not present

## 2017-04-11 DIAGNOSIS — Z113 Encounter for screening for infections with a predominantly sexual mode of transmission: Secondary | ICD-10-CM

## 2017-04-11 LAB — WET PREP FOR TRICH, YEAST, CLUE
Clue Cells Wet Prep HPF POC: NONE SEEN
TRICH WET PREP: NONE SEEN

## 2017-04-11 MED ORDER — HYDROCHLOROTHIAZIDE 25 MG PO TABS: 25.0000 mg | ORAL_TABLET | Freq: Every day | ORAL | 3 refills | Status: DC

## 2017-04-11 MED ORDER — LOSARTAN POTASSIUM 50 MG PO TABS: 50.0000 mg | ORAL_TABLET | Freq: Every day | ORAL | 2 refills | Status: DC

## 2017-04-11 MED ORDER — MUPIROCIN CALCIUM 2 % NA OINT: 1.0000 "application " | TOPICAL_OINTMENT | Freq: Two times a day (BID) | NASAL | 0 refills | Status: DC

## 2017-04-11 NOTE — Patient Instructions (Addendum)
Return for fasting labs We will call with results from biopsy Try the wrist braces If not improved in 1 month call us and nerve conduction will be done Use cream in your nose  F/U pending results

## 2017-04-11 NOTE — Progress Notes (Signed)
Subjective:    Patient ID: Miranda Wade, female    DOB: [birth year], 36 y.o.   MRN: [id number]  Patient presents for Annual Exam and discuss moles  Patient here for complete physical exam. Medications and History reviewed  Last Pap smear in 2016 normal Immunizations- TDAP UTD   HTN- taking HCTZ, losartan as prescribed.  Nexplanon for contraception - last July  Hyperliopidemia- check last year at [facility], LDL elevated at 167  She has a couple moles that she is concerned about. The one on her right forearm has been growing feels like it is more raised. She also has a skin tag on her left side of her neck that is getting caught in her jewelry getting irritated and occasionally bleeds.  Her other concern is numbness and tingling in her left hand this is been going on for a couple months. When she wakes in the morning she says shake her hand out. 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 Denies any neck injury or pain denies any shoulder pain.  She is also concerned about spot on her nose that is tender states that this Will come of it but it never goes all the way down.  Review Of Systems:  GEN- denies fatigue, fever, weight loss,weakness, recent illness HEENT- denies eye drainage, change in vision, nasal discharge, CVS- denies chest pain, palpitations RESP- denies SOB, cough, wheeze ABD- denies N/V, change in stools, abd pain GU- denies dysuria, hematuria, dribbling, incontinence MSK- denies joint pain, muscle aches, injury Neuro- denies headache, dizziness, syncope, seizure activity       Objective:    BP 118/70   Pulse 91   Temp 98.3 F (36.8 C) (Oral)   Resp 16   Ht 5' 4.5" (1.638 m)   Wt 221 lb 6.4 oz (100.4 kg)   SpO2 99%   BMI 37.42 kg/m  GEN- NAD, alert and oriented x3 HEENT- PERRL, EOMI, non injected sclera, pink conjunctiva, MMM, oropharynx clear,nares - small putule in right  nares  Neck- Supple, no thyromegaly CVS- RRR, soft systolic  murmur RESP-CTAB Breast- normal symmetry, no nipple inversion,no nipple drainage, no nodules or lumps felt Nodes- no axillary nodes ABD-NABS,soft,NT,ND GU- normal external genitalia, vaginal mucosa pink and moist, cervix visualized + polyp at os , no blood form os, minimal thin clear discharge, no CMT, no ovarian masses, uterus normal size Skin- skin tag left neck, right forearm 19mm circular lesion hyperpigmented  NEURO-CNII-XII intact, +tinels, neg phalens Normal grasp, sensation grossly in tact UE, able to make fist, strength equal bilat UE EXT- No edema Pulses- Radial, DP- 2+  Procedure- Skin Tag Removal left neck  Procedure explained to patient questions answered benefits and risks discussed verbal consent obtained. Antiseptic-ETOH Anesthesia-LIQUID NITROGEN APPLIED VIA SPRAY GUN  Tags clipped at base with scissors Minimal blood loss, Drysol touched to lesion on neck and axilla due to persistent oozing Patient tolerated procedure well Bandage applied    Procedure- shave biopsy,right forearm Procedure explained to patient questions answered benefits and risks discussed verbal consent obtained. Antiseptic- betadine Anesthesia-lidocaine 1% with epi , 1cc Razor used to shave lesion, sent to pathology  Minimal blood loss,Drysol touched to lesion on neck and axilla due to persistent oozing Patient tolerated procedure well Bandage applied       Assessment & Plan:      Problem List Items Addressed This Visit    Obesity   Hyperlipidemia    Return for recheck Discussed weight, eating  a lot of fast food and late at night Minimal exericse      Relevant Orders   Lipid panel   Essential hypertension, benign    Well controlled, no changes Return for fasting labs       Other Visit Diagnoses    Routine general medical examination at a health care facility    -  Primary   [facility] done immunizations up-to-date Pap smear done SCD screening   Relevant Orders   CBC with  Differential/Platelet   Comprehensive metabolic panel   HIV antibody   Cervical cancer screening       Relevant Orders   PAP, ThinPrep ASCUS Rflx HPV Rflx Type   Carpal tunnel syndrome of left wrist       Nevus, atypical       Pathology sent.   Relevant Orders   Pathology Report   Skin tag       Easily removed at bedside.   Screen for STD (sexually transmitted disease)       Relevant Orders   WET PREP FOR [place], YEAST, CLUE (Completed)   GC/Chlamydia Probe Amp   Internal nasal lesion       Nasal pustule treat with Bactroban ENT if not improved   Polyp at cervical os       Await Pap smear within sent to GYN.      Note: This dictation was prepared with Dragon dictation along with smaller phrase technology. Any transcriptional errors that result from this process are unintentional.

## 2017-04-11 NOTE — Assessment & Plan Note (Signed)
Well controlled, no changes Return for fasting labs

## 2017-04-11 NOTE — Assessment & Plan Note (Signed)
Return for recheck Discussed weight, eating a lot of fast food and late at night Minimal exericse

## 2017-04-12 LAB — GC/CHLAMYDIA PROBE AMP
CT PROBE, AMP APTIMA: NOT DETECTED
GC Probe RNA: NOT DETECTED

## 2017-04-14 ENCOUNTER — Other Ambulatory Visit: Payer: Self-pay | Admitting: *Deleted

## 2017-04-14 ENCOUNTER — Encounter: Payer: Self-pay | Admitting: Family Medicine

## 2017-04-14 MED ORDER — MUPIROCIN 2 % EX OINT: 1.0000 "application " | TOPICAL_OINTMENT | Freq: Two times a day (BID) | CUTANEOUS | 0 refills | Status: DC

## 2017-04-15 ENCOUNTER — Other Ambulatory Visit: Payer: Medicaid Other

## 2017-04-15 DIAGNOSIS — E7800 Pure hypercholesterolemia, unspecified: Secondary | ICD-10-CM

## 2017-04-15 DIAGNOSIS — Z0000 Encounter for general adult medical examination without abnormal findings: Secondary | ICD-10-CM

## 2017-04-15 LAB — CBC WITH DIFFERENTIAL/PLATELET
BASOS ABS: 0 {cells}/uL (ref 0–200)
Basophils Relative: 0 %
EOS PCT: 3 %
Eosinophils Absolute: 252 cells/uL (ref 15–500)
HEMATOCRIT: 36.3 % (ref 35.0–45.0)
HEMOGLOBIN: 11.5 g/dL — AB (ref 12.0–15.0)
LYMPHS ABS: 2268 {cells}/uL (ref 850–3900)
Lymphocytes Relative: 27 %
MCH: 25.8 pg — AB (ref 27.0–33.0)
MCHC: 31.7 g/dL — AB (ref 32.0–36.0)
MCV: 81.6 fL (ref 80.0–100.0)
MONO ABS: 756 {cells}/uL (ref 200–950)
MPV: 8.8 fL (ref 7.5–12.5)
Monocytes Relative: 9 %
NEUTROS PCT: 61 %
Neutro Abs: 5124 cells/uL (ref 1500–7800)
Platelets: 372 10*3/uL (ref 140–400)
RBC: 4.45 MIL/uL (ref 3.80–5.10)
RDW: 15 % (ref 11.0–15.0)
WBC: 8.4 10*3/uL (ref 3.8–10.8)

## 2017-04-15 LAB — PAP THINPREP ASCUS RFLX HPV RFLX TYPE

## 2017-04-15 LAB — PATHOLOGY

## 2017-04-16 ENCOUNTER — Other Ambulatory Visit: Payer: Self-pay

## 2017-04-16 LAB — COMPREHENSIVE METABOLIC PANEL
ALBUMIN: 3.8 g/dL (ref 3.6–5.1)
ALT: 10 U/L (ref 6–29)
AST: 12 U/L (ref 10–30)
Alkaline Phosphatase: 73 U/L (ref 33–115)
BUN: 12 mg/dL (ref 7–25)
CHLORIDE: 100 mmol/L (ref 98–110)
CO2: 22 mmol/L (ref 20–31)
Calcium: 8.7 mg/dL (ref 8.6–10.2)
Creat: 0.72 mg/dL (ref 0.50–1.10)
Glucose, Bld: 80 mg/dL (ref 70–99)
Potassium: 3.3 mmol/L — ABNORMAL LOW (ref 3.5–5.3)
Sodium: 136 mmol/L (ref 135–146)
Total Bilirubin: 0.3 mg/dL (ref 0.2–1.2)
Total Protein: 7.1 g/dL (ref 6.1–8.1)

## 2017-04-16 LAB — LIPID PANEL
CHOL/HDL RATIO: 5.5 ratio — AB (ref <5.0)
Cholesterol: 205 mg/dL — ABNORMAL HIGH (ref <200)
HDL: 37 mg/dL — ABNORMAL LOW (ref >50)
LDL CALC: 157 mg/dL — AB (ref <100)
TRIGLYCERIDES: 55 mg/dL (ref <150)
VLDL: 11 mg/dL (ref <30)

## 2017-04-16 LAB — HIV ANTIBODY (ROUTINE TESTING W REFLEX): HIV 1&2 Ab, 4th Generation: NONREACTIVE

## 2017-04-17 ENCOUNTER — Other Ambulatory Visit: Payer: Self-pay | Admitting: *Deleted

## 2017-04-17 MED ORDER — POTASSIUM CHLORIDE CRYS ER 20 MEQ PO TBCR: 20.0000 meq | EXTENDED_RELEASE_TABLET | Freq: Every day | ORAL | 0 refills | Status: DC

## 2017-05-12 ENCOUNTER — Encounter (HOSPITAL_COMMUNITY): Payer: Self-pay | Admitting: Emergency Medicine

## 2017-05-12 ENCOUNTER — Ambulatory Visit (HOSPITAL_COMMUNITY)
Admission: EM | Admit: 2017-05-12 | Discharge: 2017-05-12 | Disposition: A | Discharge status: Home / Self Care | Payer: Medicaid Other | Attending: Internal Medicine | Admitting: Internal Medicine

## 2017-05-12 DIAGNOSIS — T700XXA Otitic barotrauma, initial encounter: Secondary | ICD-10-CM

## 2017-05-12 DIAGNOSIS — H9202 Otalgia, left ear: Secondary | ICD-10-CM

## 2017-05-12 DIAGNOSIS — H6982 Other specified disorders of Eustachian tube, left ear: Secondary | ICD-10-CM

## 2017-05-12 NOTE — ED Triage Notes (Signed)
Pt here for left ear pain onset 5 days associated w/submandibular swelling, dysphagia  Denies ST, fevers  A&O x4... NAD... Ambulatory

## 2017-05-12 NOTE — ED Provider Notes (Signed)
CSN: [id number]     Arrival date & time 05/12/17  1345 History   First MD Initiated Contact with Patient 05/12/17 1538     Chief Complaint  Patient presents with  . Otalgia   (Consider location/radiation/quality/duration/timing/severity/associated sxs/prior Treatment) 36 year old female complaining of left earache. She points to the posterior aspect of the left mandible and soft tissue beneath the left mandible. She states the pain radiates from this area to the preauricular area. Denies problems with hearing. Sometimes in the morning she awakens in her throat feels a little irritated but then clears a little while later. Denies sore throat. Start of symptoms began about 4-5 days ago. Denies runny nose or nasal congestion. No fever or chills.      Past Medical History:  Diagnosis Date  . Allergy    seasonal  . Hypercholesteremia   . Hypertension    History reviewed. No pertinent surgical history. Family History  Problem Relation Age of Onset  . Diabetes Mother   . Hypertension Mother   . Arthritis Mother   . Hyperlipidemia Mother   . Stroke Mother   . Hypertension Father   . Heart disease Father        valve replacement  . Hyperlipidemia Father   . Asthma Daughter    Social History  Substance Use Topics  . Smoking status: Former Smoker    Packs/day: 0.50  . Smokeless tobacco: Never Used  . Alcohol use No   OB History    No data available     Review of Systems  Constitutional: Negative.   HENT: Positive for ear pain. Negative for congestion, postnasal drip and sore throat.   Eyes: Negative.   Respiratory: Negative.   Neurological: Negative.   All other systems reviewed and are negative.   Allergies  Patient has no known allergies.  Home Medications   Prior to Admission medications   Medication Sig Start Date End Date Taking? Authorizing Provider  etonogestrel (NEXPLANON) 68 MG IMPL implant 1 each by Subdermal route once.   Yes [provider]   hydrochlorothiazide (HYDRODIURIL) 25 MG tablet Take 1 tablet (25 mg total) by mouth daily. 04/11/17  Yes [place], Modena Nunnery, MD  losartan (COZAAR) 50 MG tablet Take 1 tablet (50 mg total) by mouth daily. 04/11/17  Yes [place], Modena Nunnery, MD  ibuprofen (ADVIL,MOTRIN) 600 MG tablet Take 1 tablet (600 mg total) by mouth 3 (three) times daily with meals as needed. 05/06/16   Julianne Rice, MD  mupirocin ointment (BACTROBAN) 2 % Place 1 application into the nose 2 (two) times daily. 04/14/17   Alycia Rossetti, MD  potassium chloride SA (K-DUR,KLOR-CON) 20 MEQ tablet Take 1 tablet (20 mEq total) by mouth daily. x7 days. 04/17/17   Alycia Rossetti, MD   Meds Ordered and Administered this Visit  Medications - No data to display  BP 120/89 (BP Location: Left Arm)   Pulse 82   Temp 98.5 F (36.9 C) (Oral)   Resp 16   LMP 04/28/2017   SpO2 100%  No data found.   Physical Exam  Constitutional: She is oriented to person, place, and time. She appears well-developed and well-nourished. No distress.  HENT:  Right TM is normal. Left TM with mild retraction but is transparent, normal light reflex, no erythema or effusion. Tenderness beneath the left ear and the post mandibular area over the eustachian tube. No facial or upper cervical lymphadenopathy. No nodes.  Eyes: EOM are normal.  Neck: Normal range of  motion. Neck supple.  Cardiovascular: Normal rate.   Pulmonary/Chest: Effort normal and breath sounds normal. She has no wheezes.  Musculoskeletal: Normal range of motion.  Lymphadenopathy:    She has no cervical adenopathy.  Neurological: She is alert and oriented to person, place, and time.  Skin: Skin is warm.  Nursing note and vitals reviewed.   Urgent Care Course     Procedures (including critical care time)  Labs Review Labs Reviewed - No data to display  Imaging Review No results found.   Visual Acuity Review  Right Eye Distance:   Left Eye Distance:   Bilateral  Distance:    Right Eye Near:   Left Eye Near:    Bilateral Near:         MDM   1. Eustachian tube dysfunction, left   2. Barotitis media, initial encounter    Try taking Allegra 180 mg a day and Sudafed PE 10 mg every 4-6 hours for congestion of the eustachian tube. Ibuprofen 600 mg every 6 hours for pain and he may apply ice or heat to the side of the jaw, which ever feels best. For worsening or development of knots in the neck may return.     Janne Napoleon, NP 05/12/17 1555

## 2017-05-12 NOTE — Discharge Instructions (Signed)
Try taking Allegra 180 mg a day and Sudafed PE 10 mg every 4-6 hours for congestion of the eustachian tube. Ibuprofen 600 mg every 6 hours for pain and he may apply ice or heat to the side of the jaw, which ever feels best. For worsening or development of knots in the neck may return.

## 2017-05-14 ENCOUNTER — Ambulatory Visit: Payer: Self-pay | Admitting: Family Medicine

## 2017-06-12 ENCOUNTER — Telehealth: Payer: Self-pay | Admitting: Family Medicine

## 2017-06-12 NOTE — Telephone Encounter (Signed)
Call placed to patient. Advised that prescription was sent to pharmacy and confirmed buy pharmacy on 04/11/2017. Patient states that she has not gotten refill at this time.   Call placed to pharmacy. Was advised that prescription is ready to be filled, but patient has not requested refill. Requested to refill today for patient.   Call placed to patient and patient made aware.

## 2017-06-12 NOTE — Telephone Encounter (Signed)
New Message   *STAT* If patient is at the pharmacy, call can be transferred to refill team.   1. Which medications need to be refilled? (please list name of each medication and dose if known)  hydrochlorothiazide (hydrodiuril) 25 mg tablet once daily  2. Which pharmacy/location (including street and city if local pharmacy) is medication to be sent to? [facility] in [place], [place]  3. Do they need a 30 day or 90 day supply?  30 day supply  Pt has been discharged on 6.25.18 which I advised pt I doubt if she nurse will refill and pt challenged it should have been refilled on the day of the appt, 6.22.18.  Pt also voiced for nurse to give her call.  Please f/u

## 2017-07-21 ENCOUNTER — Ambulatory Visit (INDEPENDENT_AMBULATORY_CARE_PROVIDER_SITE_OTHER): Payer: Medicaid Other | Admitting: Family Medicine

## 2017-07-21 ENCOUNTER — Ambulatory Visit (INDEPENDENT_AMBULATORY_CARE_PROVIDER_SITE_OTHER): Payer: Medicaid Other | Admitting: Otolaryngology

## 2017-07-21 ENCOUNTER — Ambulatory Visit (HOSPITAL_COMMUNITY)
Admission: RE | Admit: 2017-07-21 | Discharge: 2017-07-21 | Disposition: A | Discharge status: Home / Self Care | Payer: Medicaid Other | Source: Ambulatory Visit | Attending: Family Medicine | Admitting: Family Medicine

## 2017-07-21 ENCOUNTER — Encounter: Payer: Self-pay | Admitting: Family Medicine

## 2017-07-21 VITALS — BP 118/78 | HR 90 | Resp 18 | Ht 64.5 in | Wt 221.0 lb

## 2017-07-21 DIAGNOSIS — E7800 Pure hypercholesterolemia, unspecified: Secondary | ICD-10-CM | POA: Diagnosis not present

## 2017-07-21 DIAGNOSIS — I517 Cardiomegaly: Secondary | ICD-10-CM

## 2017-07-21 DIAGNOSIS — I10 Essential (primary) hypertension: Secondary | ICD-10-CM | POA: Diagnosis not present

## 2017-07-21 DIAGNOSIS — H6983 Other specified disorders of Eustachian tube, bilateral: Secondary | ICD-10-CM | POA: Diagnosis not present

## 2017-07-21 DIAGNOSIS — X58XXXA Exposure to other specified factors, initial encounter: Secondary | ICD-10-CM | POA: Diagnosis not present

## 2017-07-21 DIAGNOSIS — D140 Benign neoplasm of middle ear, nasal cavity and accessory sinuses: Secondary | ICD-10-CM | POA: Diagnosis not present

## 2017-07-21 DIAGNOSIS — S39012A Strain of muscle, fascia and tendon of lower back, initial encounter: Secondary | ICD-10-CM

## 2017-07-21 MED ORDER — MELOXICAM 15 MG PO TABS: 15.0000 mg | ORAL_TABLET | Freq: Every day | ORAL | 0 refills | Status: DC

## 2017-07-21 NOTE — Patient Instructions (Signed)
Try the stretches I showed you  Do them twice a day Take the Meloxicam once a day with food for 2 weeks Call if that does not help and I will refer to Dr Aline Brochure (ortho)  Walk every day that you are able See me in six months

## 2017-07-21 NOTE — Progress Notes (Signed)
Chief Complaint  Patient presents with  . Establish Care    new patient  new patient to me Health history reviewed BP well controlled Lipids elevated but her CV risk is 3% over the next 10 years.  Advised to watch diet and exercise Health maintenance up to date Lives with daughters while she and husband "work things out" New complaint of "hip" pain for a year.  Comes and goes.  Hurts at rest and with activity.  Points to posterior pelvis region on Left.  No injury.  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  Has not yet been evaluated.  No radiation.  No numbness or weakness   Patient Active Problem List   Diagnosis Date Noted  . LVH (left ventricular hypertrophy) 07/21/2017  . Essential hypertension, benign 04/30/2016  . Heart murmur 04/30/2016  . Obesity 04/30/2016  . Hyperlipidemia 04/30/2016    Outpatient Encounter Prescriptions as of 07/21/2017  Medication Sig  . etonogestrel (NEXPLANON) 68 MG IMPL implant 1 each by Subdermal route once.  . hydrochlorothiazide (HYDRODIURIL) 25 MG tablet Take 1 tablet (25 mg total) by mouth daily.  Marland Kitchen ibuprofen (ADVIL,MOTRIN) 600 MG tablet Take 1 tablet (600 mg total) by mouth 3 (three) times daily with meals as needed.  Marland Kitchen losartan (COZAAR) 50 MG tablet Take 1 tablet (50 mg total) by mouth daily.  . meloxicam (MOBIC) 15 MG tablet Take 1 tablet (15 mg total) by mouth daily.   No facility-administered encounter medications on file as of 07/21/2017.     Past Medical History:  Diagnosis Date  . Allergy    seasonal  . Hypercholesteremia   . Hypertension     History reviewed. No pertinent surgical history.  Social History   Social History  . Marital status: Married    Spouse name: [name]  . Number of children: 2  . Years of education: 14   Occupational History  . CNA     [employer]   Social History Main Topics  . Smoking status: Former Smoker    Packs/day: 0.50  . Smokeless tobacco: Never Used  . Alcohol use No  .  Drug use: No  . Sexual activity: Yes    Birth control/ protection: Implant   Other Topics Concern  . Not on file   Social History Narrative   Lives daughter [name] is in college, home some weekends   Currently separated from husband    Family History  Problem Relation Age of Onset  . Diabetes Mother   . Hypertension Mother   . Arthritis Mother   . Hyperlipidemia Mother   . Stroke Mother   . Hypertension Father   . Heart disease Father 45       valve replacement  . Hyperlipidemia Father   . Asthma Daughter   . Hypertension Brother   . Stroke Maternal Grandmother   . Allergies Daughter     Review of Systems  Constitutional: Negative for chills, fever and weight loss.  HENT: Negative for congestion and hearing loss.   Eyes: Negative for blurred vision and pain.  Respiratory: Negative for cough and shortness of breath.   Cardiovascular: Negative for chest pain and leg swelling.  Gastrointestinal: Negative for abdominal pain, constipation, diarrhea and heartburn.  Genitourinary: Negative for dysuria and frequency.  Musculoskeletal: Positive for back pain. Negative for falls, joint pain and myalgias.  Neurological: Negative for dizziness, seizures and headaches.  Psychiatric/Behavioral: Negative for depression. The patient is not nervous/anxious  and does not have insomnia.     BP 118/78   Pulse 90   Resp 18   Ht 5' 4.5" (1.638 m)   Wt 221 lb (100.2 kg)   SpO2 95%   BMI 37.35 kg/m   Physical Exam  Constitutional: She is oriented to person, place, and time. She appears well-developed and well-nourished. No distress.  Overweight.  Slow to get off of chair  HENT:  Head: Normocephalic and atraumatic.  Mouth/Throat: Oropharynx is clear and moist.  Eyes: Pupils are equal, round, and reactive to light. Conjunctivae are normal.  Neck: Normal range of motion.  Cardiovascular: Normal rate, regular rhythm and normal heart sounds.   Pulmonary/Chest: Effort  normal and breath sounds normal.  Musculoskeletal: Normal range of motion. She exhibits no edema.  Tender over left SI joint.  No muscle spasm.  No tenderness greater trochanter.  Good hip ROM.  Negative SLR  Lymphadenopathy:    She has no cervical adenopathy.  Neurological: She is alert and oriented to person, place, and time. She displays normal reflexes.  Psychiatric: She has a normal mood and affect. Her behavior is normal. Thought content normal.   ASSESSMENT/PLAN:  1. LVH (left ventricular hypertrophy) On echo.  BP controlled  2. Essential hypertension, benign Controlled.  Compliant with medicine  3. Chronic sacroiliac (SI) strain, initial encounter New Dx.  Will get x rays.  Trial of mobic.  Recommend PT.  Stretches demonstrated to PT. - DG HIP UNILAT WITH PELVIS 2-3 VIEWS LEFT; Future  4. Pure hypercholesterolemia Diet/exercise discussed  Greater than 50% of this visit was spent in counseling and coordinating care.  Total face to face time:   40 minutes reviewing health history, discussing CV risk, eval and Tx hip pain   Patient Instructions  Try the stretches I showed you  Do them twice a day Take the Meloxicam once a day with food for 2 weeks Call if that does not help and I will refer to Dr Aline Brochure (ortho)  Walk every day that you are able See me in six months    Raylene Everts, MD

## 2017-07-30 ENCOUNTER — Other Ambulatory Visit: Payer: Self-pay | Admitting: Otolaryngology

## 2017-08-04 ENCOUNTER — Ambulatory Visit (HOSPITAL_BASED_OUTPATIENT_CLINIC_OR_DEPARTMENT_OTHER)
Admission: RE | Admit: 2017-08-04 | Discharge: 2017-08-04 | Disposition: A | Discharge status: Home / Self Care | Payer: Medicaid Other | Source: Ambulatory Visit | Attending: Otolaryngology | Admitting: Otolaryngology

## 2017-08-04 ENCOUNTER — Encounter (HOSPITAL_BASED_OUTPATIENT_CLINIC_OR_DEPARTMENT_OTHER): Payer: Self-pay

## 2017-08-04 ENCOUNTER — Encounter (HOSPITAL_BASED_OUTPATIENT_CLINIC_OR_DEPARTMENT_OTHER)
Admission: RE | Disposition: A | Discharge status: Home / Self Care | Payer: Self-pay | Source: Ambulatory Visit | Attending: Otolaryngology

## 2017-08-04 DIAGNOSIS — D2239 Melanocytic nevi of other parts of face: Secondary | ICD-10-CM | POA: Insufficient documentation

## 2017-08-04 DIAGNOSIS — Z87891 Personal history of nicotine dependence: Secondary | ICD-10-CM | POA: Diagnosis not present

## 2017-08-04 DIAGNOSIS — I10 Essential (primary) hypertension: Secondary | ICD-10-CM | POA: Insufficient documentation

## 2017-08-04 DIAGNOSIS — R0981 Nasal congestion: Secondary | ICD-10-CM | POA: Insufficient documentation

## 2017-08-04 DIAGNOSIS — J349 Unspecified disorder of nose and nasal sinuses: Secondary | ICD-10-CM | POA: Diagnosis present

## 2017-08-04 DIAGNOSIS — D385 Neoplasm of uncertain behavior of other respiratory organs: Secondary | ICD-10-CM | POA: Diagnosis not present

## 2017-08-04 HISTORY — PX: MASS EXCISION: SHX2000

## 2017-08-04 SURGERY — MINOR EXCISION OF MASS
Anesthesia: LOCAL | Site: Nose | Laterality: Right

## 2017-08-04 MED ORDER — BACITRACIN 500 UNIT/GM EX OINT
TOPICAL_OINTMENT | CUTANEOUS | Status: DC | PRN
  Administered 2017-08-04: 1 via TOPICAL

## 2017-08-04 MED ORDER — LIDOCAINE-EPINEPHRINE 1 %-1:100000 IJ SOLN
INTRAMUSCULAR | Status: AC
  Filled 2017-08-04: qty 1

## 2017-08-04 MED ORDER — OXYMETAZOLINE HCL 0.05 % NA SOLN
NASAL | Status: AC
Start: 2017-08-04 — End: 2017-08-04
  Filled 2017-08-04: qty 15

## 2017-08-04 MED ORDER — BACITRACIN ZINC 500 UNIT/GM EX OINT
TOPICAL_OINTMENT | CUTANEOUS | Status: AC
Start: 2017-08-04 — End: 2017-08-04
  Filled 2017-08-04: qty 28.35

## 2017-08-04 MED ORDER — LIDOCAINE-EPINEPHRINE 1 %-1:100000 IJ SOLN
INTRAMUSCULAR | Status: DC | PRN
  Administered 2017-08-04: .5 mL

## 2017-08-04 MED ORDER — AMOXICILLIN 875 MG PO TABS: 875.0000 mg | ORAL_TABLET | Freq: Two times a day (BID) | ORAL | 0 refills | Status: AC

## 2017-08-04 MED ORDER — OXYCODONE-ACETAMINOPHEN 5-325 MG PO TABS: 1.0000 | ORAL_TABLET | ORAL | 0 refills | Status: DC | PRN

## 2017-08-04 SURGICAL SUPPLY — 33 items
ATTRACTOMAT 16X20 MAGNETIC DRP (DRAPES) IMPLANT
BLADE SURG 15 STRL LF DISP TIS (BLADE) IMPLANT
BLADE SURG 15 STRL SS (BLADE)
CANISTER SUCT 1200ML W/VALVE (MISCELLANEOUS) ×2 IMPLANT
COAGULATOR SUCT 8FR VV (MISCELLANEOUS) IMPLANT
DECANTER SPIKE VIAL GLASS SM (MISCELLANEOUS) IMPLANT
DRSG TELFA 3X8 NADH (GAUZE/BANDAGES/DRESSINGS) IMPLANT
ELECT REM PT RETURN 9FT ADLT (ELECTROSURGICAL) ×2
ELECTRODE REM PT RTRN 9FT ADLT (ELECTROSURGICAL) ×1 IMPLANT
GLOVE BIO SURGEON STRL SZ7.5 (GLOVE) ×2 IMPLANT
GLOVE SURG SS PI 7.0 STRL IVOR (GLOVE) ×2 IMPLANT
GOWN STRL REUS W/ TWL LRG LVL3 (GOWN DISPOSABLE) ×2 IMPLANT
GOWN STRL REUS W/TWL LRG LVL3 (GOWN DISPOSABLE) ×2
NEEDLE HYPO 25X1 1.5 SAFETY (NEEDLE) ×2 IMPLANT
NS IRRIG 1000ML POUR BTL (IV SOLUTION) IMPLANT
PACK BASIN DAY SURGERY FS (CUSTOM PROCEDURE TRAY) ×2 IMPLANT
PACK ENT DAY SURGERY (CUSTOM PROCEDURE TRAY) ×2 IMPLANT
PACKING NASAL EPIS 4X2.4 XEROG (MISCELLANEOUS) IMPLANT
PENCIL BUTTON HOLSTER BLD 10FT (ELECTRODE) ×2 IMPLANT
SCRUB TECHNI CARE SURGICAL (MISCELLANEOUS) IMPLANT
SLEEVE SCD COMPRESS KNEE MED (MISCELLANEOUS) IMPLANT
SPLINT NASAL AIRWAY SILICONE (MISCELLANEOUS) IMPLANT
SPONGE GAUZE 2X2 8PLY STRL LF (GAUZE/BANDAGES/DRESSINGS) IMPLANT
SPONGE NEURO XRAY DETECT 1X3 (DISPOSABLE) ×2 IMPLANT
SUT CHROMIC 4 0 P 3 18 (SUTURE) ×2 IMPLANT
SUT PLAIN 4 0 ~~LOC~~ 1 (SUTURE) IMPLANT
SUT PROLENE 3 0 PS 2 (SUTURE) IMPLANT
SUT VIC AB 4-0 P-3 18XBRD (SUTURE) IMPLANT
SUT VIC AB 4-0 P3 18 (SUTURE)
TOWEL OR 17X24 6PK STRL BLUE (TOWEL DISPOSABLE) ×2 IMPLANT
TUBE SALEM SUMP 12R W/ARV (TUBING) IMPLANT
TUBE SALEM SUMP 16 FR W/ARV (TUBING) IMPLANT
YANKAUER SUCT BULB TIP NO VENT (SUCTIONS) ×2 IMPLANT

## 2017-08-04 NOTE — Op Note (Signed)
DATE OF PROCEDURE:  08/04/2017                              OPERATIVE REPORT  SURGEON:  Leta Baptist, MD  PREOPERATIVE DIAGNOSES: 1. Right nasal mass.  POSTOPERATIVE DIAGNOSES: 1. Right nasal mass.  PROCEDURE PERFORMED:  Excision of right nasal mass.  ANESTHESIA:  Local anesthesia with 1% lidocaine with 1-100,000 epinephrine  COMPLICATIONS:  None.  ESTIMATED BLOOD LOSS:  Minimal.  INDICATION FOR PROCEDURE:  Miranda Wade is a 36 y.o. female with a gradually enlarging right nasal mass. The lesion bleeds occasionally. On examination, the patient was noted to have a subcentimeter lesion within the superior aspect of her right nostril.The appearance was suggestive of a papilloma versus a benign cyst. The patient would like to have the lesion removed. The risks, benefits, alternatives, and details of the procedure were discussed with the patient.  Questions were invited and answered.  Informed consent was obtained.  DESCRIPTION:  The patient was taken to the operating room and placed supine on the operating table. Anterior rhinoscopy showed a subcentimeter soft tissue lesion at the superior aspect of the right nostril.  Local anesthesia was infiltrated around the right nostril lesion. An elliptical incision was made around the lesion. The entire lesion was removed. The specimen was sent to the pathology department for permanent histologic identification. The incision was closed with interrupted 4-0 Vicryl sutures. The patient was transferred to the recovery room in good condition.  OPERATIVE FINDINGS:  A subcentimeter soft tissue mass within the superior aspect of the right nostril.  SPECIMEN:  Right nasal mass.  FOLLOWUP CARE:  The patient will be discharged home once awake and alert.    The patient will follow up in my office in approximately 1 week.  Miranda Wade [name] 08/04/2017 8:02 AM

## 2017-08-04 NOTE — H&P (Signed)
Cc: Right nasal mass  The patient is a 36 y/o female who presents today for evaluation of a bump in her right nostril and clogging in her left ear. The patient has noted the bump in her nostril for the past year but feels it is getting larger in size. She was treated with Bactroban ointment but no improvement was noted. The lesion bleeds occasionally. The patient also complains of intermittent clogging in her left ear for the past 4-6 weeks after flying. Her symptoms are gradually getting better but she still has difficulty performing the valsalva exercise to autoinsufflate her middle ear space. The patient denies any hearing loss, pain, or drainage. She has noted some mild nasal congestion. Previous ENT surgery is denied.   The patient's review of systems (constitutional, eyes, ENT, cardiovascular, respiratory, GI, musculoskeletal, skin, neurologic, psychiatric, endocrine, hematologic, allergic) is noted in the ROS questionnaire.  It is reviewed with the patient.   Family health history: Diabetes, hypertension.  Major events: None.  Ongoing medical problems: Hypertension.  Social history: The patient is married. She is a former smoker. She denies the use of alcohol or illegal drugs.  Exam: General: Communicates without difficulty, well nourished, no acute distress. Head: Normocephalic, no evidence injury, no tenderness, facial buttresses intact without stepoff. Eyes: PERRL, EOMI. No scleral icterus, conjunctivae clear. Neuro: CN II exam reveals vision grossly intact.  No nystagmus at any point of gaze. Ears: Auricles well formed without lesions.  Ear canals are intact without mass or lesion.  No erythema or edema is appreciated.  The TMs are intact without fluid. Nose: External evaluation reveals normal support and skin without lesions.  Dorsum is intact.  Anterior rhinoscopy reveals congested mucosa over anterior aspect of inferior turbinates and intact septum.  No purulence noted.  A subcentimeter  lesion is noted at the superior aspect of the right nostril. Oral:  Oral cavity and oropharynx are intact, symmetric, without erythema or edema.  Mucosa is moist without lesions. Neck: Full range of motion without pain.  There is no significant lymphadenopathy.  No masses palpable.  Thyroid bed within normal limits to palpation.  Parotid glands and submandibular glands equal bilaterally without mass.  Trachea is midline. Neuro:  CN 2-12 grossly intact. Gait normal. Vestibular: No nystagmus at any point of gaze. The cerebellar examination is unremarkable.   Assessment 1. The patient's ear canals, tympanic membranes and middle ear spaces are all normal.  2. Her ear symptoms are consistent with eustachian tube dysfunction. 3. A subcentimeter lesion is noted at the superior aspect of the right nostril. The appearance is suggestive of a cyst or papilloma.   Plan  1. The physical exam findings are reviewed with the patient.  2.  Flonase 2 sprays each side QD. The proper technique of applying nasal spray and the importance of using the spray daily is discussed with the patient.  3. The patient is encouraged to perform the Valsalva exercise daily.  4. Treatment options for her nasal lesion include observation versus excision. The risks, benefits, alternatives, and details of the procedure are reviewed with the patient. Questions are invited and answered. 5. The patient would like to proceed with excision of the lesion. The procedure will be scheduled in accordance with the patient's schedule.

## 2017-08-04 NOTE — Discharge Instructions (Signed)
The patient may resume all her previous activities and diet. She will follow-up in my office in one week.

## 2017-08-05 ENCOUNTER — Encounter (HOSPITAL_BASED_OUTPATIENT_CLINIC_OR_DEPARTMENT_OTHER): Payer: Self-pay | Admitting: Otolaryngology

## 2017-08-11 ENCOUNTER — Ambulatory Visit (INDEPENDENT_AMBULATORY_CARE_PROVIDER_SITE_OTHER): Payer: Medicaid Other | Admitting: Otolaryngology

## 2017-12-29 ENCOUNTER — Encounter: Payer: Self-pay | Admitting: Family Medicine

## 2018-01-01 ENCOUNTER — Telehealth: Payer: Self-pay

## 2018-01-01 NOTE — Telephone Encounter (Signed)
Losartan has been recalled. Please advise.

## 2018-01-01 NOTE — Telephone Encounter (Signed)
Please call pt about her BP med that has been re-called.  She is aware that she will need to get a new PC doctor.

## 2018-01-02 MED ORDER — OLMESARTAN MEDOXOMIL 20 MG PO TABS: 20.0000 mg | ORAL_TABLET | Freq: Every day | ORAL | 0 refills | Status: DC

## 2018-01-02 NOTE — Telephone Encounter (Signed)
Change her to Benicar 20 mg a day.  90 pills, no refill

## 2018-01-02 NOTE — Addendum Note (Signed)
Addended by: Merceda Elks on: 01/02/2018 01:40 PM   Modules accepted: Orders

## 2018-01-02 NOTE — Telephone Encounter (Signed)
Done

## 2018-01-12 ENCOUNTER — Ambulatory Visit (INDEPENDENT_AMBULATORY_CARE_PROVIDER_SITE_OTHER): Payer: Medicaid Other | Admitting: Family Medicine

## 2018-01-12 ENCOUNTER — Telehealth: Payer: Self-pay | Admitting: Family Medicine

## 2018-01-12 ENCOUNTER — Encounter: Payer: Self-pay | Admitting: Family Medicine

## 2018-01-12 ENCOUNTER — Other Ambulatory Visit: Payer: Self-pay

## 2018-01-12 VITALS — BP 116/56 | HR 81 | Temp 98.5°F | Resp 14 | Ht 65.0 in | Wt 225.0 lb

## 2018-01-12 DIAGNOSIS — I10 Essential (primary) hypertension: Secondary | ICD-10-CM | POA: Diagnosis not present

## 2018-01-12 LAB — LIPID PANEL
CHOL/HDL RATIO: 6 (calc) — AB (ref <5.0)
Cholesterol: 193 mg/dL (ref <200)
HDL: 32 mg/dL — ABNORMAL LOW (ref >50)
LDL CHOLESTEROL (CALC): 146 mg/dL — AB
NON-HDL CHOLESTEROL (CALC): 161 mg/dL — AB (ref <130)
TRIGLYCERIDES: 57 mg/dL (ref <150)

## 2018-01-12 LAB — BASIC METABOLIC PANEL WITH GFR
BUN: 9 mg/dL (ref 7–25)
CALCIUM: 8.7 mg/dL (ref 8.6–10.2)
CO2: 29 mmol/L (ref 20–32)
CREATININE: 0.72 mg/dL (ref 0.50–1.10)
Chloride: 103 mmol/L (ref 98–110)
GFR, EST AFRICAN AMERICAN: 124 mL/min/{1.73_m2} (ref >=60)
GFR, EST NON AFRICAN AMERICAN: 107 mL/min/{1.73_m2} (ref >=60)
Glucose, Bld: 109 mg/dL — ABNORMAL HIGH (ref 65–99)
Potassium: 3.5 mmol/L (ref 3.5–5.3)
Sodium: 138 mmol/L (ref 135–146)

## 2018-01-12 LAB — CBC
HEMATOCRIT: 34.8 % — AB (ref 35.0–45.0)
Hemoglobin: 11.4 g/dL — ABNORMAL LOW (ref 11.7–15.5)
MCH: 26.2 pg — ABNORMAL LOW (ref 27.0–33.0)
MCHC: 32.8 g/dL (ref 32.0–36.0)
MCV: 80 fL (ref 80.0–100.0)
MPV: 9.6 fL (ref 7.5–12.5)
Platelets: 365 10*3/uL (ref 140–400)
RBC: 4.35 10*6/uL (ref 3.80–5.10)
RDW: 13.5 % (ref 11.0–15.0)
WBC: 8.9 10*3/uL (ref 3.8–10.8)

## 2018-01-12 NOTE — Telephone Encounter (Signed)
Patient was sent a letter of from [facility] to find a new doctor but at checkout she said she was told to schedule w/ Dr.Hagler can you please clarify this.

## 2018-01-12 NOTE — Telephone Encounter (Signed)
Pt is calling back to check on status

## 2018-01-12 NOTE — Telephone Encounter (Signed)
Please call and explain this to the patient. Thanks. Gwen Her. Mannie Stabile, MD

## 2018-01-12 NOTE — Patient Instructions (Signed)
Cut the HCTZ in half If your blood pressure stays low then can discontinue Need labs today   Follow up in six months

## 2018-01-12 NOTE — Progress Notes (Signed)
    Chief Complaint  Patient presents with  . Follow-up   Patient is here for routine scheduled follow-up. Her blood pressure is very well controlled.  She questions whether she needs to be on 2 medications.  I told her that she can try discontinuing the hydrochlorothiazide and see whether she can be controlled on just Cozaar. She is due for lab work today. She has not been able to change her diet and exercise to lose weight.  We discussed the benefits of a heart healthy diet and regular exercise.  Patient Active Problem List   Diagnosis Date Noted  . LVH (left ventricular hypertrophy) 07/21/2017  . Essential hypertension, benign 04/30/2016  . Heart murmur 04/30/2016  . Obesity 04/30/2016  . Hyperlipidemia 04/30/2016    Outpatient Encounter Medications as of 01/12/2018  Medication Sig  . CINNAMON PO Take by mouth.  . etonogestrel (NEXPLANON) 68 MG IMPL implant 1 each by Subdermal route once.  . hydrochlorothiazide (HYDRODIURIL) 25 MG tablet Take 1 tablet (25 mg total) by mouth daily.  Marland Kitchen losartan (COZAAR) 50 MG tablet Take 1 tablet (50 mg total) by mouth daily.   No facility-administered encounter medications on file as of 01/12/2018.     No Known Allergies  Review of Systems  Constitutional: Negative for activity change, appetite change and unexpected weight change.  HENT: Negative for congestion, dental problem, postnasal drip and rhinorrhea.   Eyes: Negative for redness and visual disturbance.  Respiratory: Negative for cough and shortness of breath.   Cardiovascular: Negative for chest pain, palpitations and leg swelling.  Gastrointestinal: Negative for abdominal pain, constipation and diarrhea.  Genitourinary: Negative for difficulty urinating and frequency.  Musculoskeletal: Negative for arthralgias and back pain.  Neurological: Negative for dizziness and headaches.  Psychiatric/Behavioral: Negative for dysphoric mood and sleep disturbance. The patient is not  nervous/anxious.     BP (!) 116/56   Pulse 81   Temp 98.5 F (36.9 C)   Resp 14   Ht 5\' 5"  (1.651 m)   Wt 225 lb 0.6 oz (102.1 kg)   LMP 12/29/2017   SpO2 98%   BMI 37.45 kg/m   Physical Exam  Constitutional: She is oriented to person, place, and time. She appears well-developed and well-nourished.  HENT:  Head: Normocephalic and atraumatic.  Mouth/Throat: Oropharynx is clear and moist.  Eyes: Pupils are equal, round, and reactive to light. Conjunctivae are normal.  Neck: Normal range of motion. Neck supple. No thyromegaly present.  Cardiovascular: Normal rate, regular rhythm and normal heart sounds.  Pulmonary/Chest: Effort normal and breath sounds normal. No respiratory distress.  Abdominal: Soft. Bowel sounds are normal.  Musculoskeletal: Normal range of motion. She exhibits no edema.  Lymphadenopathy:    She has no cervical adenopathy.  Neurological: She is alert and oriented to person, place, and time.  Gait normal  Skin: Skin is warm and dry.  Psychiatric: She has a normal mood and affect. Her behavior is normal. Thought content normal.  Nursing note and vitals reviewed.   ASSESSMENT/PLAN:  1. Essential hypertension, benign  - CBC - BASIC METABOLIC PANEL WITH GFR - Lipid panel   Patient Instructions  Cut the HCTZ in half If your blood pressure stays low then can discontinue Need labs today   Follow up in six months   Raylene Everts, MD

## 2018-01-13 NOTE — Telephone Encounter (Signed)
Per Dr Meda Coffee move this patient to Dr Mannie Stabile - I called pt

## 2018-01-19 ENCOUNTER — Ambulatory Visit: Payer: Medicaid Other | Admitting: Family Medicine

## 2018-03-27 ENCOUNTER — Encounter: Payer: Self-pay | Admitting: Family Medicine

## 2018-05-26 ENCOUNTER — Other Ambulatory Visit: Payer: Self-pay | Admitting: Nurse Practitioner

## 2018-05-26 DIAGNOSIS — N924 Excessive bleeding in the premenopausal period: Secondary | ICD-10-CM

## 2018-05-27 ENCOUNTER — Ambulatory Visit (INDEPENDENT_AMBULATORY_CARE_PROVIDER_SITE_OTHER): Payer: PRIVATE HEALTH INSURANCE | Admitting: Family Medicine

## 2018-05-27 ENCOUNTER — Encounter: Payer: Self-pay | Admitting: Family Medicine

## 2018-05-27 ENCOUNTER — Other Ambulatory Visit: Payer: Self-pay

## 2018-05-27 VITALS — BP 118/76 | HR 70 | Temp 98.6°F | Resp 12 | Ht 65.0 in | Wt 222.1 lb

## 2018-05-27 DIAGNOSIS — E782 Mixed hyperlipidemia: Secondary | ICD-10-CM | POA: Diagnosis not present

## 2018-05-27 DIAGNOSIS — I10 Essential (primary) hypertension: Secondary | ICD-10-CM

## 2018-05-27 MED ORDER — LOSARTAN POTASSIUM 50 MG PO TABS: 50.0000 mg | ORAL_TABLET | Freq: Every day | ORAL | 1 refills | Status: DC

## 2018-05-27 MED ORDER — HYDROCHLOROTHIAZIDE 12.5 MG PO TABS: 12.5000 mg | ORAL_TABLET | Freq: Every day | ORAL | 1 refills | Status: DC

## 2018-05-27 NOTE — Progress Notes (Signed)
Patient ID: Miranda Wade, female    DOB: 1981/01/27, 37 y.o.   MRN: [id number]  Chief Complaint  Patient presents with  . Hypertension    follow up    Allergies Patient has no known allergies.  Subjective:   CHERICA Wade is a 37 y.o. female who presents to [facility] today.  HPI Alonah presents today for follow-up of her blood pressure.  She reports her blood pressures been running well.  She reports that after her last visit with Dr. Meda Coffee they discussed possibly discontinuing her HCTZ prior to her visit.  She reports that she did stop the medicine may be several months ago but then her blood pressure went up so she restarted the HCTZ at 12.5 mg a day.  She reports that her blood pressures been running well at this dose.  She is trying to lose weight.  She has lost 3 pounds since her last visit.  She is trying to do some exercise.  She does not take a daily aspirin.  Her LDL was elevated and her HDL was low at last check in 2018.  She has no history of heart attack or stroke.  She needs a refill on her medications.  She denies any tobacco, alcohol, or drug use.  Patient reports that her mood is good.  She sleeps well.  Energy level is good.  Recently had a complete gynecologic physical at the [facility] and had labs done.  She reports she has had a hepatitis A/B vaccination. She denies any chest pain, shortness of breath, swelling in her extremities, or palpitations.  Denies any side effects with her medicine.  Has no cough.  Urinating well.  No history of diabetes.   Past Medical History:  Diagnosis Date  . Allergy    seasonal  . Hypercholesteremia   . Hypertension     Past Surgical History:  Procedure Laterality Date  . MASS EXCISION Right 08/04/2017   Procedure: EXCISION OF RIGHT NASAL  MASS;  Surgeon: Leta Baptist, MD;  Location: [place];  Service: ENT;  Laterality: Right;  LOCAL    Family History  Problem Relation Age of Onset    . Diabetes Mother   . Hypertension Mother   . Arthritis Mother   . Hyperlipidemia Mother   . Stroke Mother   . Hypertension Father   . Heart disease Father 24       valve replacement  . Hyperlipidemia Father   . Asthma Daughter   . Hypertension Brother   . Stroke Maternal Grandmother   . Allergies Daughter      Social History   Socioeconomic History  . Marital status: Married    Spouse name: [name]  . Number of children: 2  . Years of education: 55  . Highest education level: Not on file  Occupational History  . 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[employer]  Social Needs  . Financial resource strain: Not on file  . Food insecurity:    Worry: Not on file    Inability: Not on file  . Transportation needs:    Medical: Not on file    Non-medical: Not on file  Tobacco Use  . Smoking status: Former Smoker    Packs/day: 0.50  . Smokeless tobacco: Never Used  Substance and Sexual Activity  . Alcohol use: No  . Drug use: No  . Sexual activity: Yes    Birth control/protection: Implant  Lifestyle  . Physical  activity:    Days per week: Not on file    Minutes per session: Not on file  . Stress: Not on file  Relationships  . Social connections:    Talks on phone: Not on file    Gets together: Not on file    Attends religious service: Not on file    Active member of club or organization: Not on file    Attends meetings of clubs or organizations: Not on file    Relationship status: Not on file  Other Topics Concern  . Not on file  Social History Narrative   Lives daughter [name] is in college, home some weekends   Currently separated from husband   Current Outpatient Medications on File Prior to Visit  Medication Sig Dispense Refill  . CINNAMON PO Take 1 tablet by mouth daily.     Marland Kitchen etonogestrel (NEXPLANON) 68 MG IMPL implant 1 each by Subdermal route once.     No current facility-administered medications on file prior to visit.     Review of Systems   Constitutional: Negative for activity change, appetite change and fever.  Eyes: Negative for visual disturbance.  Respiratory: Negative for cough, chest tightness and shortness of breath.   Cardiovascular: Negative for chest pain, palpitations and leg swelling.  Gastrointestinal: Negative for abdominal pain, nausea and vomiting.  Endocrine: Negative for polyphagia and polyuria.  Genitourinary: Negative for dysuria, frequency and urgency.  Musculoskeletal: Negative for myalgias.  Neurological: Negative for dizziness, tremors, syncope, weakness, light-headedness, numbness and headaches.  Hematological: Negative for adenopathy.  Psychiatric/Behavioral: Negative for dysphoric mood. The patient is not nervous/anxious.      Objective:   BP 118/76 (BP Location: Left Arm, Patient Position: Sitting, Cuff Size: Large)   Pulse 70   Temp 98.6 F (37 C) (Temporal)   Resp 12   Ht 5\' 5"  (1.651 m)   Wt 222 lb 1.3 oz (100.7 kg)   SpO2 98% Comment: room air  BMI 36.96 kg/m   Physical Exam  Constitutional: She is oriented to person, place, and time. She appears well-developed and well-nourished.  Cardiovascular: Normal rate, regular rhythm, normal heart sounds and intact distal pulses.  Pulmonary/Chest: Effort normal and breath sounds normal. No respiratory distress.  Neurological: She is alert and oriented to person, place, and time. No cranial nerve deficit.  Skin: Skin is warm and dry. Capillary refill takes less than 2 seconds. No erythema.  No edema in lower extremities bilaterally.  Psychiatric: She has a normal mood and affect. Her behavior is normal. Judgment and thought content normal.  Vitals reviewed.    Assessment and Plan   1. Essential hypertension, benign Blood pressure goals discussed with patient.  Blood pressure well controlled.  Continue current medications.  Refilled at this time.  Recommended BMP checked every 6 months.  Continue with diet, exercise, and weight loss  modifications.  Continue to monitor salt intake.  Risk versus benefits of medication discussed. - hydrochlorothiazide (HYDRODIURIL) 12.5 MG tablet; Take 1 tablet (12.5 mg total) by mouth daily.  Dispense: 90 tablet; Refill: 1 - losartan (COZAAR) 50 MG tablet; Take 1 tablet (50 mg total) by mouth daily.  Dispense: 90 tablet; Refill: 1 - Basic metabolic panel  2. Mixed hyperlipidemia ASCVD risk score calculated today.  It is not recommended for patient to be on aspirin at this time due to her cardiac risk factors.  In addition, her risk score is less than 3%.  She is recommended today to work  on her diet, exercise and to lose weight.  Defers starting statins at this time.  Calculations were based on last years FLP.  She will return to clinic and get her cholesterol checked. - Lipid panel  No follow-ups on file.  Patient agrees to follow-up in 3 months with new PCP. Caren Macadam, MD 05/27/2018

## 2018-05-29 LAB — BASIC METABOLIC PANEL
BUN: 10 mg/dL (ref 7–25)
CALCIUM: 9 mg/dL (ref 8.6–10.2)
CO2: 27 mmol/L (ref 20–32)
Chloride: 103 mmol/L (ref 98–110)
Creat: 0.73 mg/dL (ref 0.50–1.10)
GLUCOSE: 108 mg/dL — AB (ref 65–99)
Potassium: 3.7 mmol/L (ref 3.5–5.3)
SODIUM: 138 mmol/L (ref 135–146)

## 2018-05-29 LAB — LIPID PANEL
CHOLESTEROL: 207 mg/dL — AB (ref <200)
HDL: 35 mg/dL — ABNORMAL LOW (ref >50)
LDL CHOLESTEROL (CALC): 154 mg/dL — AB
Non-HDL Cholesterol (Calc): 172 mg/dL (calc) — ABNORMAL HIGH (ref <130)
TRIGLYCERIDES: 77 mg/dL (ref <150)
Total CHOL/HDL Ratio: 5.9 (calc) — ABNORMAL HIGH (ref <5.0)

## 2018-06-02 ENCOUNTER — Encounter: Payer: Self-pay | Admitting: Family Medicine

## 2018-06-02 ENCOUNTER — Ambulatory Visit
Admission: RE | Admit: 2018-06-02 | Discharge: 2018-06-02 | Disposition: A | Discharge status: Home / Self Care | Payer: PRIVATE HEALTH INSURANCE | Source: Ambulatory Visit | Attending: Nurse Practitioner | Admitting: Nurse Practitioner

## 2018-06-02 DIAGNOSIS — N924 Excessive bleeding in the premenopausal period: Secondary | ICD-10-CM

## 2019-03-08 ENCOUNTER — Encounter (HOSPITAL_COMMUNITY): Payer: Self-pay | Admitting: Emergency Medicine

## 2019-03-08 ENCOUNTER — Emergency Department (HOSPITAL_COMMUNITY)
Admission: EM | Admit: 2019-03-08 | Discharge: 2019-03-08 | Disposition: A | Discharge status: Home / Self Care | Payer: PRIVATE HEALTH INSURANCE | Attending: Emergency Medicine | Admitting: Emergency Medicine

## 2019-03-08 ENCOUNTER — Other Ambulatory Visit: Payer: Self-pay

## 2019-03-08 ENCOUNTER — Emergency Department (HOSPITAL_COMMUNITY): Payer: PRIVATE HEALTH INSURANCE

## 2019-03-08 DIAGNOSIS — I10 Essential (primary) hypertension: Secondary | ICD-10-CM | POA: Insufficient documentation

## 2019-03-08 DIAGNOSIS — Y998 Other external cause status: Secondary | ICD-10-CM | POA: Insufficient documentation

## 2019-03-08 DIAGNOSIS — W19XXXA Unspecified fall, initial encounter: Secondary | ICD-10-CM

## 2019-03-08 DIAGNOSIS — Z79899 Other long term (current) drug therapy: Secondary | ICD-10-CM | POA: Insufficient documentation

## 2019-03-08 DIAGNOSIS — Z87891 Personal history of nicotine dependence: Secondary | ICD-10-CM | POA: Insufficient documentation

## 2019-03-08 DIAGNOSIS — M25511 Pain in right shoulder: Secondary | ICD-10-CM | POA: Insufficient documentation

## 2019-03-08 DIAGNOSIS — W109XXA Fall (on) (from) unspecified stairs and steps, initial encounter: Secondary | ICD-10-CM | POA: Insufficient documentation

## 2019-03-08 DIAGNOSIS — Y9289 Other specified places as the place of occurrence of the external cause: Secondary | ICD-10-CM | POA: Insufficient documentation

## 2019-03-08 DIAGNOSIS — Y9301 Activity, walking, marching and hiking: Secondary | ICD-10-CM | POA: Insufficient documentation

## 2019-03-08 MED ORDER — IBUPROFEN 400 MG PO TABS
600.0000 mg | ORAL_TABLET | Freq: Once | ORAL | Status: AC
  Administered 2019-03-08: 600 mg via ORAL
  Filled 2019-03-08: qty 2

## 2019-03-08 MED ORDER — NAPROXEN 500 MG PO TABS: 500.0000 mg | ORAL_TABLET | Freq: Two times a day (BID) | ORAL | 0 refills | Status: DC

## 2019-03-08 NOTE — ED Provider Notes (Signed)
[facility] EMERGENCY DEPARTMENT Provider Note   CSN: [id number] Arrival date & time: 03/08/19  1434    History   Chief Complaint Chief Complaint  Patient presents with  . Fall    HPI Miranda Wade is a 38 y.o. female.     Miranda Wade is a 38 y.o. female with a history of hypertension, hyperlipidemia and seasonal allergies, who presents to the emergency department for evaluation of right shoulder pain.  She reports yesterday while going up the steps she tripped and fell on both of her outstretched arms.  She did not hit her head.  She reports since then she has had pain across her right clavicle and in her right shoulder.  She is able to move the shoulder but reports it is uncomfortable.  She denies any pain in the wrists elbows or hands.  Has not noted any swelling, bruising or deformity.  Pain is worse with movement.  Some improvement with ibuprofen.  No prior injury or surgery to the right shoulder.  Denies any other injuries or pain from the fall.  No neck or back pain.  No numbness tingling or weakness in her extremities.  No abrasions or lacerations.     Past Medical History:  Diagnosis Date  . Allergy    seasonal  . Hypercholesteremia   . Hypertension     Patient Active Problem List   Diagnosis Date Noted  . LVH (left ventricular hypertrophy) 07/21/2017  . Essential hypertension, benign 04/30/2016  . Heart murmur 04/30/2016  . Obesity 04/30/2016  . Hyperlipidemia 04/30/2016    Past Surgical History:  Procedure Laterality Date  . MASS EXCISION Right 08/04/2017   Procedure: EXCISION OF RIGHT NASAL  MASS;  Surgeon: Leta Baptist, MD;  Location: [place];  Service: ENT;  Laterality: Right;  LOCAL     OB History   No obstetric history on file.      Home Medications    Prior to Admission medications   Medication Sig Start Date End Date Taking? Authorizing Provider  CINNAMON PO Take 1 tablet by mouth daily.     [provider]   etonogestrel (NEXPLANON) 68 MG IMPL implant 1 each by Subdermal route once.    [provider]  hydrochlorothiazide (HYDRODIURIL) 12.5 MG tablet Take 1 tablet (12.5 mg total) by mouth daily. 05/27/18   Caren Macadam, MD  losartan (COZAAR) 50 MG tablet Take 1 tablet (50 mg total) by mouth daily. 05/27/18   Caren Macadam, MD  naproxen (NAPROSYN) 500 MG tablet Take 1 tablet (500 mg total) by mouth 2 (two) times daily. 03/08/19   Jacqlyn Larsen, PA-C    Family History Family History  Problem Relation Age of Onset  . Diabetes Mother   . Hypertension Mother   . Arthritis Mother   . Hyperlipidemia Mother   . Stroke Mother   . Hypertension Father   . Heart disease Father 51       valve replacement  . Hyperlipidemia Father   . Asthma Daughter   . Hypertension Brother   . Stroke Maternal Grandmother   . Allergies Daughter     Social History Social History   Tobacco Use  . Smoking status: Former Smoker    Packs/day: 0.50  . Smokeless tobacco: Never Used  Substance Use Topics  . Alcohol use: No  . Drug use: No     Allergies   Patient has no known allergies.   Review of Systems Review of Systems  Constitutional: Negative for chills and fever.  Musculoskeletal: Positive for arthralgias. Negative for back pain, joint swelling and neck pain.  Skin: Negative for color change, rash and wound.  Neurological: Negative for weakness and numbness.     Physical Exam Updated Vital Signs BP (!) 141/103 (BP Location: Right Arm)   Pulse 80   Temp 98.4 F (36.9 C) (Oral)   Resp 16   Ht 5\' 5"  (1.651 m)   Wt 100.7 kg   SpO2 100%   BMI 36.94 kg/m   Physical Exam Vitals signs and nursing note reviewed.  Constitutional:      General: She is not in acute distress.    Appearance: She is well-developed. She is not diaphoretic.  HENT:     Head: Normocephalic and atraumatic.  Eyes:     General:        Right eye: No discharge.        Left eye: No discharge.  Neck:      Musculoskeletal: Neck supple.  Pulmonary:     Effort: Pulmonary effort is normal. No respiratory distress.  Musculoskeletal:     Comments: Tenderness to palpation over the right clavicle without palpable deformity or fracture, no overlying ecchymosis or erythema, no wounds or breaks in the skin.  There is also some tenderness over the posterior shoulder.  Range of motion intact but limited due to pain.  No tenderness throughout the upper arm, elbow forearm and wrist.  2+ radial pulse and good capillary refill, 5/5 strength. Left upper extremity is unremarkable.  All compartments soft.  Skin:    General: Skin is warm and dry.     Capillary Refill: Capillary refill takes less than 2 seconds.  Neurological:     Mental Status: She is alert.     Coordination: Coordination normal.  Psychiatric:        Mood and Affect: Mood normal.        Behavior: Behavior normal.      ED Treatments / Results  Labs (all labs ordered are listed, but only abnormal results are displayed) Labs Reviewed - No data to display  EKG None  Radiology Dg Shoulder Right  Result Date: 03/08/2019 CLINICAL DATA:  Shoulder pain after a fall. EXAM: RIGHT SHOULDER - 2+ VIEW COMPARISON:  None. FINDINGS: There is no evidence of fracture or dislocation. There is no evidence of arthropathy or other focal bone abnormality. Soft tissues are unremarkable. IMPRESSION: Negative. Electronically Signed   By: Staci Righter M.D.   On: 03/08/2019 19:35    Procedures Procedures (including critical care time)  Medications Ordered in ED Medications  ibuprofen (ADVIL) tablet 600 mg (600 mg Oral Given 03/08/19 1904)     Initial Impression / Assessment and Plan / ED Course  I have reviewed the triage vital signs and the nursing notes.  Pertinent labs & imaging results that were available during my care of the patient were reviewed by me and considered in my medical decision making (see chart for details).  Presents with right  shoulder pain after fall, she did not fall directly on the shoulder, but caught herself on the outstretched arm and has had pain in the shoulder since then.  No deformity noted on exam.  Range of motion limited by pain but shoulder and arm are neurovascularly intact.  X-ray shows no evidence of fracture, images reviewed by myself and agree with radiologist finding.  Likely soft tissue strain from fall.  Will treat with course of NSAIDs, ice and heat.  PCP follow-up recommended if not improving.  Return precautions discussed.  Patient expresses understanding and agreement with plan.  Discharged home in good condition.  Final Clinical Impressions(s) / ED Diagnoses   Final diagnoses:  Fall, initial encounter  Acute pain of right shoulder    ED Discharge Orders         Ordered    naproxen (NAPROSYN) 500 MG tablet  2 times daily     03/08/19 1953           Janet Berlin 03/08/19 2031    Julianne Rice, MD 03/09/19 [phone]

## 2019-03-08 NOTE — Discharge Instructions (Signed)
Your x-ray shows no evidence of fracture within your shoulder or collarbone.  Likely soft tissue strain from your fall.  Take Naprosyn twice daily for pain, alternate ice and heat, use range of motion exercises provided as tolerated.  Follow-up with your primary care doctor if pain is not improving.

## 2019-03-08 NOTE — ED Notes (Signed)
Pt brought to hallway bed

## 2019-03-08 NOTE — ED Triage Notes (Signed)
Pt fell yesterday while going up steps. Pt c/o pain near RT clavicle.

## 2019-09-02 ENCOUNTER — Other Ambulatory Visit: Payer: Self-pay

## 2019-09-02 ENCOUNTER — Emergency Department (HOSPITAL_COMMUNITY)
Admission: EM | Admit: 2019-09-02 | Discharge: 2019-09-02 | Disposition: A | Discharge status: Home / Self Care | Payer: Self-pay | Attending: Emergency Medicine | Admitting: Emergency Medicine

## 2019-09-02 ENCOUNTER — Encounter (HOSPITAL_COMMUNITY): Payer: Self-pay | Admitting: Emergency Medicine

## 2019-09-02 DIAGNOSIS — W19XXXA Unspecified fall, initial encounter: Secondary | ICD-10-CM

## 2019-09-02 DIAGNOSIS — W010XXA Fall on same level from slipping, tripping and stumbling without subsequent striking against object, initial encounter: Secondary | ICD-10-CM | POA: Insufficient documentation

## 2019-09-02 DIAGNOSIS — S79912A Unspecified injury of left hip, initial encounter: Secondary | ICD-10-CM | POA: Insufficient documentation

## 2019-09-02 DIAGNOSIS — Y92242 Post office as the place of occurrence of the external cause: Secondary | ICD-10-CM | POA: Insufficient documentation

## 2019-09-02 DIAGNOSIS — Y990 Civilian activity done for income or pay: Secondary | ICD-10-CM | POA: Insufficient documentation

## 2019-09-02 DIAGNOSIS — S6992XA Unspecified injury of left wrist, hand and finger(s), initial encounter: Secondary | ICD-10-CM | POA: Insufficient documentation

## 2019-09-02 DIAGNOSIS — Y9389 Activity, other specified: Secondary | ICD-10-CM | POA: Insufficient documentation

## 2019-09-02 NOTE — ED Triage Notes (Signed)
Pt fell. C/o of left hip, left ankle and left wrist.

## 2019-09-02 NOTE — Discharge Instructions (Signed)
Take Motrin, Tylenol for pain.  Apply ice.  Return for any new or worsening symptoms such as severe pain out of proportion to your initial examination, new numbness or weakness.

## 2019-09-02 NOTE — ED Provider Notes (Signed)
[facility] EMERGENCY DEPARTMENT Provider Note   CSN: [id number] Arrival date & time: 09/02/19  1149     History   Chief Complaint Chief Complaint  Patient presents with  . Fall    HPI Noheli Boettner Sponaugle is a 38 y.o. female who presents emergency department chief complaint of mechanical fall.  [place] she slipped on standing water, fell in a twisting motion onto her left wrist and hip.  She had a bit of twisting of her left ankle.  She denies hitting her head or losing consciousness.  She has no previous injuries to these areas.     HPI  Past Medical History:  Diagnosis Date  . Allergy    seasonal  . Hypercholesteremia   . Hypertension     Patient Active Problem List   Diagnosis Date Noted  . LVH (left ventricular hypertrophy) 07/21/2017  . Essential hypertension, benign 04/30/2016  . Heart murmur 04/30/2016  . Obesity 04/30/2016  . Hyperlipidemia 04/30/2016    Past Surgical History:  Procedure Laterality Date  . MASS EXCISION Right 08/04/2017   Procedure: EXCISION OF RIGHT NASAL  MASS;  Surgeon: Leta Baptist, MD;  Location: [facility];  Service: ENT;  Laterality: Right;  LOCAL     OB History   No obstetric history on file.      Home Medications    Prior to Admission medications   Medication Sig Start Date End Date Taking? Authorizing Provider  CINNAMON PO Take 1 tablet by mouth daily.     [provider]  etonogestrel (NEXPLANON) 68 MG IMPL implant 1 each by Subdermal route once.    [provider]  hydrochlorothiazide (HYDRODIURIL) 12.5 MG tablet Take 1 tablet (12.5 mg total) by mouth daily. 05/27/18   Caren Macadam, MD  losartan (COZAAR) 50 MG tablet Take 1 tablet (50 mg total) by mouth daily. 05/27/18   Caren Macadam, MD  naproxen (NAPROSYN) 500 MG tablet Take 1 tablet (500 mg total) by mouth 2 (two) times daily. 03/08/19   Jacqlyn Larsen, PA-C    Family History Family History  Problem  Relation Age of Onset  . Diabetes Mother   . Hypertension Mother   . Arthritis Mother   . Hyperlipidemia Mother   . Stroke Mother   . Hypertension Father   . Heart disease Father 59       valve replacement  . Hyperlipidemia Father   . Asthma Daughter   . Hypertension Brother   . Stroke Maternal Grandmother   . Allergies Daughter     Social History Social History   Tobacco Use  . Smoking status: Former Smoker    Packs/day: 0.50  . Smokeless tobacco: Never Used  Substance Use Topics  . Alcohol use: No  . Drug use: No     Allergies   Patient has no known allergies.   Review of Systems Review of Systems  Ten systems reviewed and are negative for acute change, except as noted in the HPI.   Physical Exam Updated Vital Signs Pulse 78   Temp 98.6 F (37 C) (Oral)   Resp 17   Ht 5\' 6"  (1.676 m)   Wt 99.8 kg   SpO2 100%   BMI 35.51 kg/m   Physical Exam Vitals signs and nursing note reviewed.  Constitutional:      General: She is not in acute distress.    Appearance: She is well-developed. She is not diaphoretic.  HENT:  Head: Normocephalic and atraumatic.  Eyes:     General: No scleral icterus.    Conjunctiva/sclera: Conjunctivae normal.  Neck:     Musculoskeletal: Normal range of motion.  Cardiovascular:     Rate and Rhythm: Normal rate and regular rhythm.     Heart sounds: Normal heart sounds. No murmur. No friction rub. No gallop.   Pulmonary:     Effort: Pulmonary effort is normal. No respiratory distress.     Breath sounds: Normal breath sounds.  Abdominal:     General: Bowel sounds are normal. There is no distension.     Palpations: Abdomen is soft. There is no mass.     Tenderness: There is no abdominal tenderness. There is no guarding.  Musculoskeletal:     Comments: LLE exam Inspection- No deformity, abrasions,ecchymosis, lacerations, atrophy, or hypertrophy noted.  Palpation- Non TTP, Compartments soft ROM- Full ROM Strength- 5/5  EHL,  FHL, TA, TP Gastroc, EDL, FDL NV- SILT dp/sp/t, +2 DP/PT Ambulatory without limp   LUE exam Inspection- No erythema, ecchymosis swelling, atrophy, hypertrophy, abrasions, or lacerations noted. Palpation- No TTP, compartments soft.  No scaphoid tenderness ROM- Full ROM about the shoulder, elbow, wrist. Strength- 5/5 AIN/PIN/U NV- SILT M/R/U, +2 Radial +2 ulnar pulse   Skin:    General: Skin is warm and dry.  Neurological:     Mental Status: She is alert and oriented to person, place, and time.  Psychiatric:        Behavior: Behavior normal.      ED Treatments / Results  Labs (all labs ordered are listed, but only abnormal results are displayed) Labs Reviewed - No data to display  EKG None  Radiology No results found.  Procedures Procedures (including critical care time)  Medications Ordered in ED Medications - No data to display   Initial Impression / Assessment and Plan / ED Course  I have reviewed the triage vital signs and the nursing notes.  Pertinent labs & imaging results that were available during my care of the patient were reviewed by me and considered in my medical decision making (see chart for details).        38 year old female here with mechanical fall. No evidence of significant sprain, bruising, or suspicion for fracture. She has some tenderness with movement of the left wrist and ankle without tenderness to palpation and has an otherwise normal left hip knee and ankle examination as well as a normal left wrist elbow and shoulder examination.  Discharged with a splint bandage wrapping of the left wrist and ankle.  To follow-up with PCP.  Return precautions discussed. Final Clinical Impressions(s) / ED Diagnoses   Final diagnoses:  Fall, initial encounter    ED Discharge Orders    None       Margarita Mail, PA-C 09/02/19 1313    Isla Pence, MD 09/02/19 [phone]

## 2020-10-05 ENCOUNTER — Encounter: Payer: Self-pay | Admitting: Cardiology

## 2020-10-05 ENCOUNTER — Ambulatory Visit (INDEPENDENT_AMBULATORY_CARE_PROVIDER_SITE_OTHER): Payer: BC Managed Care – PPO | Admitting: Cardiology

## 2020-10-05 ENCOUNTER — Other Ambulatory Visit: Payer: Self-pay

## 2020-10-05 VITALS — BP 120/70 | HR 74 | Ht 65.0 in | Wt 226.0 lb

## 2020-10-05 DIAGNOSIS — I10 Essential (primary) hypertension: Secondary | ICD-10-CM | POA: Diagnosis not present

## 2020-10-05 DIAGNOSIS — R011 Cardiac murmur, unspecified: Secondary | ICD-10-CM | POA: Diagnosis not present

## 2020-10-05 NOTE — Progress Notes (Signed)
Cardiology Office Note  Date: 10/05/2020   ID: [name], DOB 1980/11/12, MRN [id number]  PCP:  [facility] [place]  Cardiologist:  Rozann Lesches, MD Electrophysiologist:  None   Chief Complaint  Patient presents with  . Heart Murmur    History of Present Illness: Miranda Wade is a 39 y.o. female referred for cardiology consultation by Ms. Muse PA-C for evaluation of heart murmur.  She presents with no progressive symptoms, no exertional chest pain or unusual shortness of breath.  She has worked as a [employer]arry manager, currently recuperating from right wrist surgery.  She does respond report a sense of brief palpitations at times, no associated syncope.  Echocardiogram from 2017 revealed mild LVH with LVEF 60 to 65%, no significant valvular abnormalities.  I personally reviewed her ECG today which shows normal sinus rhythm.  She reports compliance with her medications which I reviewed and are outlined below.  Past Medical History:  Diagnosis Date  . Anemia   . Essential hypertension   . Hyperlipidemia   . Seasonal allergies     Past Surgical History:  Procedure Laterality Date  . MASS EXCISION Right 08/04/2017   Procedure: EXCISION OF RIGHT NASAL  MASS;  Surgeon: Leta Baptist, MD;  Location: [facility];  Service: ENT;  Laterality: Right;  LOCAL  . WRIST SURGERY      Current Outpatient Medications  Medication Sig Dispense Refill  . CINNAMON PO Take 1 tablet by mouth daily.     Marland Kitchen etonogestrel (NEXPLANON) 68 MG IMPL implant 1 each by Subdermal route once.    . hydrochlorothiazide (HYDRODIURIL) 12.5 MG tablet Take 1 tablet (12.5 mg total) by mouth daily. 90 tablet 1  . losartan (COZAAR) 50 MG tablet Take 1 tablet (50 mg total) by mouth daily. 90 tablet 1  . ibuprofen (ADVIL) 600 MG tablet ibuprofen 600 mg tablet  TAKE 1 TABLET BY MOUTH EVERY 6 TO 8 HOURS AS DIRECTED AS NEEDED     No current facility-administered medications for this visit.    Allergies:  Patient has no known allergies.   Social History: The patient  reports that she has quit smoking. Her smoking use included cigarettes. She smoked 0.50 packs per day. She has never used smokeless tobacco. She reports that she does not drink alcohol and does not use drugs.   Family History: The patient's family history includes Allergies in her daughter; Arthritis in her mother; Asthma in her daughter; Diabetes in her mother; Heart disease (age of onset: 36) in her father; Hyperlipidemia in her father and mother; Hypertension in her brother, father, and mother; Stroke in her maternal grandmother and mother.   ROS: No syncope.  Physical Exam: VS:  BP 120/70   Pulse 74   Ht 5\' 5"  (1.651 m)   Wt 226 lb (102.5 kg)   SpO2 99%   BMI 37.61 kg/m , BMI Body mass index is 37.61 kg/m.  Wt Readings from Last 3 Encounters:  10/05/20 226 lb (102.5 kg)  09/02/19 220 lb (99.8 kg)  03/08/19 222 lb (100.7 kg)    General: Patient appears comfortable at rest. HEENT: Conjunctiva and lids normal, wearing a mask. Neck: Supple, no elevated JVP or carotid bruits, no thyromegaly. Lungs: Clear to auscultation, nonlabored breathing at rest. Cardiac: Regular rate and rhythm, no S3, 2/6 basal systolic murmur, no pericardial rub. Extremities: No pitting edema. Skin: Warm and dry. Musculoskeletal: No kyphosis. Neuropsychiatric: Alert and oriented x3, affect grossly appropriate.  ECG:  An ECG dated 05/07/2016 was personally reviewed today and demonstrated:  Normal sinus rhythm.  Recent Labwork:    Component Value Date/Time   CHOL 207 (H) 05/29/2018 0805   TRIG 77 05/29/2018 0805   HDL 35 (L) 05/29/2018 0805   CHOLHDL 5.9 (H) 05/29/2018 0805   VLDL 11 04/15/2017 1245   LDLCALC 154 (H) 05/29/2018 0805  September 2021: Hemoglobin 12.8  Other Studies Reviewed Today:  Echocardiogram 05/09/2016: - Left ventricle: The cavity size was normal. Wall thickness was  increased in a pattern of mild  LVH. Systolic function was normal.  The estimated ejection fraction was in the range of 60% to 65%.  Wall motion was normal; there were no regional wall motion  abnormalities. Left ventricular diastolic function parameters  were normal for the patient&'s age.  - Aortic valve: Lambl&'s excrescences noted at leaflet tips (normal  variant).  - Mitral valve: There was trivial regurgitation.  - Left atrium: The atrium was at the upper limits of normal in  size.  - Right atrium: Central venous pressure (est): 3 mm Hg.  - Atrial septum: No defect or patent foramen ovale was identified.  - Pulmonary arteries: Systolic pressure could not be accurately  estimated.  - Pericardium, extracardiac: There was no pericardial effusion.   Impressions:   - Mild LVH with LVEF 60-65%. Grossly normal diastolic function.  Upper normal left atrial chamber size. Trivial mitral and  tricuspid regurgitation. Trileaflet aortic valve with normal cusp  excursion. No pericardial effusion.   Assessment and Plan:  1.  Systolic murmur, most likely benign.  Last echocardiogram was in 2017 however, this study will be updated.  She does have hypertension with mild LVH by last evaluation.  2.  Essential hypertension, currently on Cozaar and HCTZ.  Blood pressure is well controlled today.  Medication Adjustments/Labs and Tests Ordered: Current medicines are reviewed at length with the patient today.  Concerns regarding medicines are outlined above.   Tests Ordered: Orders Placed This Encounter  Procedures  . ECHOCARDIOGRAM COMPLETE    Medication Changes: No orders of the defined types were placed in this encounter.   Disposition:  Follow up test results.  Signed, Satira Sark, MD, [facility] 10/05/2020 9:10 AM    [place] [facility] [address], [place], [place] [address] Phone: [phone]; Fax: [phone]

## 2020-10-05 NOTE — Patient Instructions (Signed)
Medication Instructions:  Your physician recommends that you continue on your current medications as directed. Please refer to the Current Medication list given to you today.  *If you need a refill on your cardiac medications before your next appointment, please call your pharmacy*   Lab Work: None Today If you have labs (blood work) drawn today and your tests are completely normal, you will receive your results only by:  [place] (if you have MyChart) OR  A paper copy in the mail If you have any lab test that is abnormal or we need to change your treatment, we will call you to review the results.   Testing/Procedures: Your physician has requested that you have an echocardiogram. Echocardiography is a painless test that uses sound waves to create images of your heart. It provides your doctor with information about the size and shape of your heart and how well your hearts chambers and valves are working. This procedure takes approximately one hour. There are no restrictions for this procedure.     Follow-Up: At [facility], you and your health needs are our priority.  As part of our continuing mission to provide you with exceptional heart care, we have created designated Provider Care Teams.  These Care Teams include your primary Cardiologist (physician) and Advanced Practice Providers (APPs -  Physician Assistants and Nurse Practitioners) who all work together to provide you with the care you need, when you need it.  We recommend signing up for the patient portal called "MyChart".  Sign up information is provided on this After Visit Summary.  MyChart is used to connect with patients for Virtual Visits (Telemedicine).  Patients are able to view lab/test results, encounter notes, upcoming appointments, etc.  Non-urgent messages can be sent to your provider as well.   To learn more about what you can do with MyChart, go to [email or web address].    Your next appointment:   We  will call you with your Echocardiogram results.

## 2020-10-10 ENCOUNTER — Ambulatory Visit (HOSPITAL_COMMUNITY)
Admission: RE | Admit: 2020-10-10 | Discharge: 2020-10-10 | Disposition: A | Discharge status: Home / Self Care | Payer: BC Managed Care – PPO | Source: Ambulatory Visit | Attending: Cardiology | Admitting: Cardiology

## 2020-10-10 ENCOUNTER — Other Ambulatory Visit: Payer: Self-pay

## 2020-10-10 DIAGNOSIS — I361 Nonrheumatic tricuspid (valve) insufficiency: Secondary | ICD-10-CM

## 2020-10-10 DIAGNOSIS — R011 Cardiac murmur, unspecified: Secondary | ICD-10-CM | POA: Insufficient documentation

## 2020-10-10 LAB — ECHOCARDIOGRAM COMPLETE
Area-P 1/2: 3.54 cm2
S' Lateral: 2.74 cm

## 2020-10-10 NOTE — Progress Notes (Signed)
*  PRELIMINARY RESULTS* Echocardiogram 2D Echocardiogram has been performed.  Leavy Cella 10/10/2020, 1:56 PM

## 2021-07-17 ENCOUNTER — Other Ambulatory Visit (HOSPITAL_COMMUNITY): Payer: Self-pay | Admitting: Nurse Practitioner

## 2021-07-17 DIAGNOSIS — Z1231 Encounter for screening mammogram for malignant neoplasm of breast: Secondary | ICD-10-CM

## 2021-07-30 ENCOUNTER — Ambulatory Visit (HOSPITAL_COMMUNITY)
Admission: RE | Admit: 2021-07-30 | Discharge: 2021-07-30 | Disposition: A | Discharge status: Home / Self Care | Payer: Self-pay | Source: Ambulatory Visit | Attending: Nurse Practitioner | Admitting: Nurse Practitioner

## 2021-07-30 ENCOUNTER — Other Ambulatory Visit: Payer: Self-pay

## 2021-07-30 DIAGNOSIS — Z1231 Encounter for screening mammogram for malignant neoplasm of breast: Secondary | ICD-10-CM | POA: Insufficient documentation

## 2021-08-11 ENCOUNTER — Ambulatory Visit
Admission: RE | Admit: 2021-08-11 | Discharge: 2021-08-11 | Disposition: A | Discharge status: Home / Self Care | Payer: Self-pay | Source: Ambulatory Visit

## 2021-08-11 ENCOUNTER — Ambulatory Visit (INDEPENDENT_AMBULATORY_CARE_PROVIDER_SITE_OTHER): Payer: Self-pay

## 2021-08-11 ENCOUNTER — Other Ambulatory Visit: Payer: Self-pay

## 2021-08-11 ENCOUNTER — Ambulatory Visit
Admission: EM | Admit: 2021-08-11 | Discharge: 2021-08-11 | Disposition: A | Discharge status: Home / Self Care | Payer: Self-pay | Attending: Family Medicine | Admitting: Family Medicine

## 2021-08-11 ENCOUNTER — Encounter: Payer: Self-pay | Admitting: Emergency Medicine

## 2021-08-11 DIAGNOSIS — I10 Essential (primary) hypertension: Secondary | ICD-10-CM

## 2021-08-11 DIAGNOSIS — J069 Acute upper respiratory infection, unspecified: Secondary | ICD-10-CM

## 2021-08-11 DIAGNOSIS — R059 Cough, unspecified: Secondary | ICD-10-CM

## 2021-08-11 DIAGNOSIS — R0602 Shortness of breath: Secondary | ICD-10-CM

## 2021-08-11 MED ORDER — HYDROCODONE BIT-HOMATROP MBR 5-1.5 MG/5ML PO SOLN: 5.0000 mL | Freq: Four times a day (QID) | ORAL | 0 refills | Status: DC | PRN

## 2021-08-11 NOTE — ED Triage Notes (Signed)
Patient c/o productive cough, sore throat, and SOB x 7 days.   Patient denies fever at home. Patient denies N/V/D.  Patient endorses symptoms haven't improved over this past week.   Patient endorses worsening SOB upon laying down and while coughing.   Patient has taken ibuprofen every 6 hours with some relief of symptoms.

## 2021-08-11 NOTE — Discharge Instructions (Signed)
Your blood pressure was noted to be elevated during your visit today. If you are currently taking medication for high blood pressure, please ensure you are taking this as directed. If you do not have a history of high blood pressure and your blood pressure remains persistently elevated, you may need to begin taking a medication at some point. You may return here within the next few days to recheck if unable to see your primary care provider or if you do not have a one.  BP (!) 160/99 (BP Location: Right Arm)   Pulse 77   Temp 98.5 F (36.9 C) (Oral)   Resp 19   LMP 08/04/2021 (Approximate)   SpO2 98%   BP Readings from Last 3 Encounters:  08/11/21 (!) 160/99  10/05/20 120/70  09/02/19 121/85

## 2021-08-11 NOTE — ED Provider Notes (Signed)
[facility]   [id number] 08/11/21 Arrival Time: 1696  ASSESSMENT & PLAN:  1. Viral URI with cough   2. Elevated blood pressure reading in office with diagnosis of hypertension    I have personally viewed the imaging studies ordered this visit. No acute changes on CXR. No PNA.  Discussed typical duration of viral illnesses. OTC symptom care as needed.  Meds ordered this encounter  Medications   HYDROcodone bit-homatropine (HYCODAN) 5-1.5 MG/5ML syrup    Sig: Take 5 mLs by mouth every 6 (six) hours as needed for cough.    Dispense:  90 mL    Refill:  0     Follow-up Information     [facility] at [facility].   Specialty: Urgent Care Why: As needed. Contact information: [address] [place] [address] [place], [facility].   Why: To recheck your blood pressure. Contact information: [address] [place] [address] [phone]                  Discharge Instructions      Your blood pressure was noted to be elevated during your visit today. If you are currently taking medication for high blood pressure, please ensure you are taking this as directed. If you do not have a history of high blood pressure and your blood pressure remains persistently elevated, you may need to begin taking a medication at some point. You may return here within the next few days to recheck if unable to see your primary care provider or if you do not have a one.  BP (!) 160/99 (BP Location: Right Arm)   Pulse 77   Temp 98.5 F (36.9 C) (Oral)   Resp 19   LMP 08/04/2021 (Approximate)   SpO2 98%   BP Readings from Last 3 Encounters:  08/11/21 (!) 160/99  10/05/20 120/70  09/02/19 121/85       Reviewed expectations re: course of current medical issues. Questions answered. Outlined signs and symptoms indicating need for more acute intervention. Understanding verbalized. After Visit  Summary given.   SUBJECTIVE: History from: patient. Miranda Wade is a 40 y.o. female who reports: cough, ST, occas SOB (while coughing); no wheezing; abrupt onset 6-7 d ago. No assoc CP. Denies: headache. Normal PO intake without n/v/d. Ibup with some help. Cough does affect sleep.  Social History   Tobacco Use  Smoking Status Former   Packs/day: 0.50   Types: Cigarettes  Smokeless Tobacco Never   Increased blood pressure noted today. Reports that she is treated for HTN. She reports no swelling of ankles, no orthostatic dizziness or lightheadedness, no orthopnea or paroxysmal nocturnal dyspnea, and no palpitations.  OBJECTIVE:  Vitals:   08/11/21 0903  BP: (!) 160/99  Pulse: 77  Resp: 19  Temp: 98.5 F (36.9 C)  TempSrc: Oral  SpO2: 98%    General appearance: alert; no distress; appears fatigued Eyes: PERRLA; EOMI; conjunctiva normal HENT: [place]; AT; with nasal congestion Neck: supple  Lungs: speaks full sentences without difficulty; unlabored Extremities: no edema Skin: warm and dry Neurologic: normal gait Psychological: alert and cooperative; normal mood and affect  Imaging: DG Chest 2 View  Result Date: 08/11/2021 CLINICAL DATA:  Cough and SOB. EXAM: CHEST - 2 VIEW COMPARISON:  05/06/2016 FINDINGS: Normal heart size. No signs of pleural effusion or edema. No airspace opacities identified. Visualized osseous structures are unremarkable. IMPRESSION: No active cardiopulmonary  disease. Electronically Signed   By: Kerby Moors M.D.   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[employer], Nurse, learning disability  Tobacco Use    Smoking status: Former    Packs/day: 0.50    Types: Cigarettes   Smokeless tobacco: Never  Vaping Use   Vaping Use: Never used  Substance and Sexual Activity   Alcohol use: No   Drug use: No   Sexual activity: Not on file  Other Topics Concern   Not on file  Social History Narrative   Lives daughter [name] is in college, home some weekends   Currently separated from husband   Social Determinants of Health   Financial Resource Strain: Not on file  Food Insecurity: Not on file  Transportation Needs: Not on file  Physical Activity: Not on file  Stress: Not on file  Social Connections: Not on file  Intimate Partner Violence: Not on file   Family History  Problem Relation Age of Onset   Diabetes Mother    Hypertension Mother    Arthritis Mother    Hyperlipidemia Mother    Stroke Mother    Hypertension Father    Heart disease Father 27       Valve replacement   Hyperlipidemia Father    Asthma Daughter    Hypertension Brother    Stroke Maternal Grandmother    Allergies Daughter    Past Surgical History:  Procedure Laterality Date   MASS EXCISION Right 08/04/2017   Procedure: EXCISION OF RIGHT NASAL  MASS;  Surgeon: Leta Baptist, MD;  Location: [facility];  Service: ENT;  Laterality: Right;  LOCAL   WRIST SURGERY       Vanessa Kick, MD 08/11/21 1008

## 2022-03-07 ENCOUNTER — Emergency Department (HOSPITAL_BASED_OUTPATIENT_CLINIC_OR_DEPARTMENT_OTHER): Payer: Medicaid Other | Admitting: Radiology

## 2022-03-07 ENCOUNTER — Encounter (HOSPITAL_BASED_OUTPATIENT_CLINIC_OR_DEPARTMENT_OTHER): Payer: Self-pay | Admitting: *Deleted

## 2022-03-07 ENCOUNTER — Other Ambulatory Visit: Payer: Self-pay

## 2022-03-07 ENCOUNTER — Emergency Department (HOSPITAL_BASED_OUTPATIENT_CLINIC_OR_DEPARTMENT_OTHER)
Admission: EM | Admit: 2022-03-07 | Discharge: 2022-03-07 | Disposition: A | Discharge status: Home / Self Care | Payer: Medicaid Other | Attending: Emergency Medicine | Admitting: Emergency Medicine

## 2022-03-07 DIAGNOSIS — I10 Essential (primary) hypertension: Secondary | ICD-10-CM | POA: Insufficient documentation

## 2022-03-07 DIAGNOSIS — Z79899 Other long term (current) drug therapy: Secondary | ICD-10-CM | POA: Insufficient documentation

## 2022-03-07 DIAGNOSIS — K6289 Other specified diseases of anus and rectum: Secondary | ICD-10-CM | POA: Insufficient documentation

## 2022-03-07 MED ORDER — HYDROCORTISONE (PERIANAL) 2.5 % EX CREA: 1.0000 "application " | TOPICAL_CREAM | Freq: Two times a day (BID) | CUTANEOUS | 0 refills | Status: DC

## 2022-03-07 NOTE — ED Triage Notes (Signed)
Pt is here with rectal pain which began as an itching and now is painful and she states that she can barely sit.  Pt states that it feels like a pressure.  No external hemorrhoids. No fever

## 2022-03-07 NOTE — ED Notes (Signed)
Rectal pain and itching, denies hx of hemorrhoids, states she can barely sit

## 2022-03-07 NOTE — Discharge Instructions (Addendum)
Please follow-up with a PCP. The health department is a good option without health insurance and there is also a [facility] on these papers that is low cost as well.This is a good starting place and they can refer you to gastroenterology as needed if your symptoms do not improve. I am treating you as though you have internal hemorrhoids unable to be palpated on physical exam.   Continue stool softeners and sit in a warm bath with Epson salt 3x daily. Be sure to increase the fiber in your diet as well.  The steroid cream may be applied to the skin breakdown just above your anus. Please return with any worsening symptoms

## 2022-03-08 NOTE — ED Provider Notes (Signed)
[facility] EMERGENCY DEPT Provider Note   CSN: [id number] Arrival date & time: 03/07/22  1740     History  Chief Complaint  Patient presents with   Rectal Pain    Miranda Wade is a 41 y.o. female with a past medical history of hypertension and obesity presenting today with a complaint of 1 week of worsening rectal pain.  Says that it is worse and she is having difficulty with bowel movements.  She is able to sit down without pain until she begins to strain to have a bowel movement.  She tried to use stool softeners and heat packs but her symptoms are not getting better.  No hematochezia or melena.  Has not noted any external hemorrhoids and does not have a history of hemorrhoids.  Denies inserting foreign bodies into the rectum or anal intercourse.  Has never seen a GI provider or had a colonoscopy for any reason.  No history of IBS/IBD/colorectal cancer.  She reports decrease in bowel movements over the past 2 days but believes it is 2/2 pain and not caused by constipation or obstruction.  HPI     Home Medications Prior to Admission medications   Medication Sig Start Date End Date Taking? Authorizing Provider  hydrocortisone (ANUSOL-HC) 2.5 % rectal cream Place 1 application. rectally 2 (two) times daily. 03/07/22  Yes Dorman Calderwood A, PA-C  CINNAMON PO Take 1 tablet by mouth daily.     [provider]  etonogestrel (NEXPLANON) 68 MG IMPL implant 1 each by Subdermal route once.    [provider]  hydrochlorothiazide (HYDRODIURIL) 12.5 MG tablet Take 1 tablet (12.5 mg total) by mouth daily. 05/27/18   Caren Macadam, MD  HYDROcodone bit-homatropine (HYCODAN) 5-1.5 MG/5ML syrup Take 5 mLs by mouth every 6 (six) hours as needed for cough. 08/11/21   Vanessa Kick, MD  ibuprofen (ADVIL) 600 MG tablet ibuprofen 600 mg tablet  TAKE 1 TABLET BY MOUTH EVERY 6 TO 8 HOURS AS DIRECTED AS NEEDED    [provider]  losartan (COZAAR) 50 MG tablet Take  1 tablet (50 mg total) by mouth daily. 05/27/18   Caren Macadam, MD      Allergies    Patient has no known allergies.    Review of Systems   Review of Systems  Physical Exam Updated Vital Signs BP (!) 153/84   Pulse 78   Temp 98 F (36.7 C) (Oral)   Resp 18   Wt 99.8 kg   LMP 02/20/2022 (Approximate)   SpO2 100%   BMI 36.61 kg/m  Physical Exam Vitals and nursing note reviewed.  Constitutional:      Appearance: Normal appearance.  HENT:     Head: Normocephalic and atraumatic.  Eyes:     General: No scleral icterus.    Conjunctiva/sclera: Conjunctivae normal.  Pulmonary:     Effort: Pulmonary effort is normal. No respiratory distress.  Abdominal:     General: There is distension.     Tenderness: There is no abdominal tenderness.  Genitourinary:    Rectum: Tenderness present. No mass or external hemorrhoid. Normal anal tone.     Comments: Exam performed in presence of nurse tech.  Patient is without external hemorrhoids.  There are mild amounts of light tan stool around the anus.  No hematochezia or melena.  Patient was with large amounts of pain limiting my internal exam.  There was especially tender area inside the rectum however patient was unable to tolerate thorough internal exam secondary  to pain.    She also did have moderate skin breakdown in the gluteal cleft which was tender to palpation.  No sign of abscess or perineal involvement  Skin:    Findings: No rash.  Neurological:     Mental Status: She is alert.  Psychiatric:        Mood and Affect: Mood normal.  Pain.  Was unable to tolerate internal exam secondary to  ED Results / Procedures / Treatments   Labs (all labs ordered are listed, but only abnormal results are displayed) Labs Reviewed - No data to display  EKG None  Radiology DG Abdomen 1 View  Result Date: 03/07/2022 CLINICAL DATA:  Lower pelvic pain/pressure. EXAM: ABDOMEN - 1 VIEW COMPARISON:  None Available. FINDINGS: The bowel gas pattern  is normal. No radio-opaque calculi or other significant radiographic abnormality are seen. IMPRESSION: Negative. Electronically Signed   By: Van Clines M.D.   On: 03/07/2022 21:12    Procedures Procedures   Medications Ordered in ED Medications - No data to display  ED Course/ Medical Decision Making/ A&P                           Medical Decision Making Amount and/or Complexity of Data Reviewed Radiology: ordered.  Risk Prescription drug management.   This patient presents to the ED for concern of rectal pain. differential includes but is not limited to external hemorrhoids, internal hemorrhoids, obstruction, diverticulitis, constipation, obstruction, impaction, fistula, fissure, anal trauma  This is not an exhaustive differential.    Past Medical History / Co-morbidities / Social History: Obesity   Physical Exam: Physical exam performed. The pertinent findings include: Skin breakdown in superior gluteal cleft, no cyst or abscess.  No tenderness externally but severe tenderness on internal exam, patient had a hard time tolerating this    Imaging Studies: I ordered imaging studies including abdominal x-ray due to patient's distention, decreased bowel movements and my inability to get a strong physical exam.  I independently visualized and interpreted imaging which showed no signs of stool burden, impaction, obstruction or other abnormality. I agree with the radiologist interpretation.    Medications: Patient had no pain at rest, no medications were ordered   Disposition: After consideration of the diagnostic results and the patients response to treatment, I feel that patient's presentation is suspicious of internal hemorrhoid unable to be palpated on exam.  Imaging was with no abnormalities and there was no external etiology of her rectal pain.  She did have skin breakdown which I would like to treat with steroid cream.  We discussed care for internal hemorrhoids and the  importance of returning with any worsening symptoms.  We specifically discussed signs of fistula, mass and infection.  She does not currently have health insurance and will not be able to follow-up with GI or surgery at this time.  She says that she would like to follow-up with the primary care until she gets her health insurance next month.  She goes to the health department in [place] but I will also give her the community health clinic in [place] which she states is reasonable because it is not a far drive.  Patient was given information about internal hemorrhoids and sitz bath's and stool softeners.  She is agreeable to discharge at this time with strict return precautions    Final Clinical Impression(s) / ED Diagnoses Final diagnoses:  Rectal pain    Rx / DC Orders ED  Discharge Orders          Ordered    hydrocortisone (ANUSOL-HC) 2.5 % rectal cream  2 times daily        03/07/22 2156           Results and diagnoses were explained to the patient. Return precautions discussed in full. Patient had no additional questions and expressed complete understanding.   This chart was dictated using voice recognition software.  Despite best efforts to proofread,  errors can occur which can change the documentation meaning.    Rhae Hammock, PA-C 03/08/22 1829    Malvin Johns, MD 03/11/22 1701

## 2022-03-20 ENCOUNTER — Encounter: Payer: Self-pay | Admitting: Internal Medicine

## 2022-03-26 ENCOUNTER — Encounter: Payer: Self-pay | Admitting: Obstetrics & Gynecology

## 2022-03-26 ENCOUNTER — Ambulatory Visit (INDEPENDENT_AMBULATORY_CARE_PROVIDER_SITE_OTHER): Payer: No Typology Code available for payment source | Admitting: Obstetrics & Gynecology

## 2022-03-26 ENCOUNTER — Other Ambulatory Visit: Payer: Self-pay | Admitting: Obstetrics & Gynecology

## 2022-03-26 VITALS — BP 124/86 | HR 75 | Ht 65.0 in | Wt 233.0 lb

## 2022-03-26 DIAGNOSIS — Z3009 Encounter for other general counseling and advice on contraception: Secondary | ICD-10-CM

## 2022-03-26 DIAGNOSIS — Z01818 Encounter for other preprocedural examination: Secondary | ICD-10-CM

## 2022-03-26 NOTE — Progress Notes (Signed)
appointment for tubal discussion/scheduling:  Has nexpalnon, DUB since got it  Chief Complaint  Patient presents with   Contraception    Wants tubal ligation    Blood pressure 124/86, pulse 75, height '5\' 5"'$  (1.651 m), weight 233 lb (105.7 kg).  No results found.  Will sign BTL papers today, 05/01/22 for BTL + nexplanon removal  MEDS ordered this encounter: No orders of the defined types were placed in this encounter.   Orders for this encounter: No orders of the defined types were placed in this encounter.   Impression + Management Plan   ICD-10-CM   1. Encounter for consultation for female sterilization  Z30.09    Laparoscopic bilateral salpinectomy + remove nexplanon 05/01/22      Follow Up: Return in about 6 weeks (around 05/09/2022) for [place], with Dr Elonda Husky.     All questions were answered.  Past Medical History:  Diagnosis Date   Anemia    Essential hypertension    Hyperlipidemia    Seasonal allergies     Past Surgical History:  Procedure Laterality Date   MASS EXCISION Right 08/04/2017   Procedure: EXCISION OF RIGHT NASAL  MASS;  Surgeon: Leta Baptist, MD;  Location: [facility];  Service: ENT;  Laterality: Right;  LOCAL   WRIST SURGERY      OB History     Gravida  3   Para  2   Term  2   Preterm      AB  1   Living  2      SAB      IAB      Ectopic      Multiple      Live Births  1           No Known Allergies  Social History   Socioeconomic History   Marital status: Married    Spouse name: [name]   Number of children: 2   Years of education: 14   Highest education level: Not on file  Occupational History   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[employer], Nurse, learning disability  Tobacco Use   Smoking status: Former    Packs/day: 0.50    Types: Cigarettes   Smokeless tobacco: Never  Vaping Use   Vaping Use: Never used  Substance and Sexual Activity   Alcohol use: No   Drug use: No   Sexual activity: Yes    Birth  control/protection: Implant  Other Topics Concern   Not on file  Social History Narrative   Lives daughter [name] is in college, home some weekends   Currently separated from husband   Social Determinants of Health   Financial Resource Strain: Low Risk    Difficulty of Paying Living Expenses: Not very hard  Food Insecurity: No Food Insecurity   Worried About Charity fundraiser in the Last Year: Never true   [place] in the Last Year: Never true  Transportation Needs: No Transportation Needs   Lack of Transportation (Medical): No   Lack of Transportation (Non-Medical): No  Physical Activity: Insufficiently Active   Days of Exercise per Week: 3 days   Minutes of Exercise per Session: 30 min  Stress: No Stress Concern Present   Feeling of Stress : Not at all  Social Connections: Moderately Integrated   Frequency of Communication with Friends and Family: More than three times a week   Frequency of Social Gatherings with Friends and Family: More  than three times a week   Attends Religious Services: 1 to 4 times per year   Active Member of Clubs or Organizations: No   Attends Archivist Meetings: Never   Marital Status: Married    Family History  Problem Relation Age of Onset   Diabetes Mother    Hypertension Mother    Arthritis Mother    Hyperlipidemia Mother    Stroke Mother    Hypertension Father    Heart disease Father 54       Valve replacement   Hyperlipidemia Father    Asthma Daughter    Hypertension Brother    Stroke Maternal Grandmother    Allergies Daughter

## 2022-03-27 ENCOUNTER — Encounter: Payer: Self-pay | Admitting: Obstetrics & Gynecology

## 2022-04-01 ENCOUNTER — Ambulatory Visit (INDEPENDENT_AMBULATORY_CARE_PROVIDER_SITE_OTHER): Payer: No Typology Code available for payment source | Admitting: Family Medicine

## 2022-04-01 ENCOUNTER — Encounter: Payer: Self-pay | Admitting: Family Medicine

## 2022-04-01 VITALS — BP 146/90 | HR 80 | Temp 98.3°F | Ht 64.5 in | Wt 237.8 lb

## 2022-04-01 DIAGNOSIS — R61 Generalized hyperhidrosis: Secondary | ICD-10-CM | POA: Diagnosis not present

## 2022-04-01 DIAGNOSIS — R0681 Apnea, not elsewhere classified: Secondary | ICD-10-CM

## 2022-04-01 DIAGNOSIS — L918 Other hypertrophic disorders of the skin: Secondary | ICD-10-CM

## 2022-04-01 DIAGNOSIS — E782 Mixed hyperlipidemia: Secondary | ICD-10-CM | POA: Diagnosis not present

## 2022-04-01 DIAGNOSIS — I10 Essential (primary) hypertension: Secondary | ICD-10-CM | POA: Diagnosis not present

## 2022-04-01 DIAGNOSIS — Z862 Personal history of diseases of the blood and blood-forming organs and certain disorders involving the immune mechanism: Secondary | ICD-10-CM | POA: Diagnosis not present

## 2022-04-01 MED ORDER — PANTOPRAZOLE SODIUM 40 MG PO TBEC: 40.0000 mg | DELAYED_RELEASE_TABLET | Freq: Every day | ORAL | 3 refills | Status: DC

## 2022-04-01 NOTE — Patient Instructions (Signed)
Labs today.  I am placing referral for sleep study.  Watch diet and potential triggering foods. Medication as directed.  Follow up to be scheduled after reviewing labs.  Take care  Dr. Lacinda Axon

## 2022-04-02 ENCOUNTER — Encounter: Payer: Self-pay | Admitting: Family Medicine

## 2022-04-02 ENCOUNTER — Other Ambulatory Visit: Payer: Self-pay | Admitting: Family Medicine

## 2022-04-02 DIAGNOSIS — R0681 Apnea, not elsewhere classified: Secondary | ICD-10-CM | POA: Insufficient documentation

## 2022-04-02 DIAGNOSIS — R61 Generalized hyperhidrosis: Secondary | ICD-10-CM | POA: Insufficient documentation

## 2022-04-02 LAB — CMP14+EGFR
ALT: 13 IU/L (ref 0–32)
AST: 10 IU/L (ref 0–40)
Albumin/Globulin Ratio: 1.1 — ABNORMAL LOW (ref 1.2–2.2)
Albumin: 3.8 g/dL (ref 3.8–4.8)
Alkaline Phosphatase: 100 IU/L (ref 44–121)
BUN/Creatinine Ratio: 15 (ref 9–23)
BUN: 10 mg/dL (ref 6–24)
Bilirubin Total: <0.2 mg/dL (ref 0.0–1.2)
CO2: 23 mmol/L (ref 20–29)
Calcium: 9 mg/dL (ref 8.7–10.2)
Chloride: 104 mmol/L (ref 96–106)
Creatinine, Ser: 0.68 mg/dL (ref 0.57–1.00)
Globulin, Total: 3.4 g/dL (ref 1.5–4.5)
Glucose: 99 mg/dL (ref 70–99)
Potassium: 3.9 mmol/L (ref 3.5–5.2)
Sodium: 143 mmol/L (ref 134–144)
Total Protein: 7.2 g/dL (ref 6.0–8.5)
eGFR: 112 mL/min/{1.73_m2} (ref >59)

## 2022-04-02 LAB — CBC
Hematocrit: 37.4 % (ref 34.0–46.6)
Hemoglobin: 12.2 g/dL (ref 11.1–15.9)
MCH: 27.1 pg (ref 26.6–33.0)
MCHC: 32.6 g/dL (ref 31.5–35.7)
MCV: 83 fL (ref 79–97)
Platelets: 367 10*3/uL (ref 150–450)
RBC: 4.5 x10E6/uL (ref 3.77–5.28)
RDW: 13.8 % (ref 11.7–15.4)
WBC: 9.2 10*3/uL (ref 3.4–10.8)

## 2022-04-02 LAB — LIPID PANEL
Chol/HDL Ratio: 4.4 ratio (ref 0.0–4.4)
Cholesterol, Total: 173 mg/dL (ref 100–199)
HDL: 39 mg/dL — ABNORMAL LOW (ref >39)
LDL Chol Calc (NIH): 121 mg/dL — ABNORMAL HIGH (ref 0–99)
Triglycerides: 67 mg/dL (ref 0–149)
VLDL Cholesterol Cal: 13 mg/dL (ref 5–40)

## 2022-04-02 LAB — HEMOGLOBIN A1C
Est. average glucose Bld gHb Est-mCnc: 114 mg/dL
Hgb A1c MFr Bld: 5.6 % (ref 4.8–5.6)

## 2022-04-02 NOTE — Addendum Note (Signed)
Addended by: Coral Spikes on: 04/02/2022 11:46 AM   Modules accepted: Orders

## 2022-04-02 NOTE — Assessment & Plan Note (Signed)
Unsure of control.  Continue Lipitor.  Awaiting labs.

## 2022-04-02 NOTE — Progress Notes (Addendum)
Subjective:  Patient ID: Miranda Wade, female    DOB: 01-16-1981  Age: 41 y.o. MRN: [id number]  CC: Chief Complaint  Patient presents with   Establish Care    Pt has pressure on rectum at times; has a nausea feeling in stomach also. Seeing GI on 04/10/22. Moles on neck that patient would like taken care of.      HPI:  41 year old female with hypertension, LVH, hyperlipidemia, morbid obesity presents to establish care.  Issues/concerns for discussed below.  Patient reports that she has skin tags around her neck.  She has previously had them removed and they have now returned.  She would like to discuss treatment today.  Patient reports ongoing intermittent nausea.  She states that sometimes it is related to food and sometimes not.  She reports associated diaphoresis.  No vomiting.  No significant abdominal pain.  Patient has had some recent rectal pain and has an upcoming appointment with GI.  No reports of constipation or diarrhea.  No rectal bleeding.  No recent labs are available.  She has been getting her labs and medications through the health department.  Patient is currently on atorvastatin 40 mg regarding her hyperlipidemia.  No reported side effects or intolerance.  Blood pressure is mildly elevated here today.  She is on losartan 50 mg daily and HCTZ 12.5 mg daily.  Patient also reports that she has had some gasping for air at night.  I suspect that she is granting sleep apnea.  She has never had this evaluated.  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[employer], Nurse, learning disability  Tobacco Use   Smoking status: Former    Packs/day: 0.50    Types: Cigarettes   Smokeless tobacco: Never  Vaping Use   Vaping Use: Never used  Substance and Sexual Activity   Alcohol use: No   Drug use: No   Sexual activity: Yes    Birth control/protection: Implant  Other Topics Concern   Not on file  Social History Narrative   Lives daughter [name] is in college, home some weekends   Currently separated from husband   Social Determinants of Health   Financial Resource Strain: Low Risk  (03/26/2022)   Overall Financial Resource Strain (CARDIA)    Difficulty of Paying Living Expenses: Not very hard  Food Insecurity: No Food Insecurity (03/26/2022)   Hunger Vital Sign    Worried About Running Out of Food in the Last Year: Never true    Ran Out of Food in the Last Year: Never true  Transportation Needs: No Transportation Needs (03/26/2022)   PRAPARE - Hydrologist (Medical): No    Lack of Transportation (Non-Medical): No  Physical Activity: Insufficiently Active (03/26/2022)   Exercise Vital Sign    Days of Exercise per Week: 3 days    Minutes of Exercise per Session: 30 min  Stress: No Stress Concern Present (03/26/2022)   [place] -  Occupational Stress Questionnaire    Feeling of Stress : Not at all  Social Connections: Moderately Integrated (03/26/2022)   Social Connection and Isolation Panel [NHANES]    Frequency of Communication with Friends and Family: More than three times a week    Frequency of Social Gatherings with Friends and Family: More than three times a week    Attends Religious Services: 1 to 4 times per year    Active Member of [employer] or Organizations: No    Attends Music therapist: Never    Marital Status: Married    Review of Systems Per HPI  Objective:  BP (!) 146/90   Pulse 80   Temp 98.3 F (36.8 C)   Ht 5' 4.5"  (1.638 m)   Wt 237 lb 12.8 oz (107.9 kg)   LMP 02/20/2022 (Approximate)   SpO2 99%   BMI 40.19 kg/m      04/01/2022    2:23 PM 03/26/2022   10:29 AM 03/07/2022   10:20 PM  BP/Weight  Systolic BP 509 326 712  Diastolic BP 90 86 84  Wt. (Lbs) 237.8 233   BMI 40.19 kg/m2 38.77 kg/m2     Physical Exam Constitutional:      Appearance: Normal appearance. She is obese.  HENT:     Head: Normocephalic and atraumatic.     Nose: Nose normal.     Mouth/Throat:     Pharynx: Oropharynx is clear.  Eyes:     General:        Right eye: No discharge.        Left eye: No discharge.     Conjunctiva/sclera: Conjunctivae normal.  Neck:     Comments: Cervical small skin tag noted around the neck. Cardiovascular:     Rate and Rhythm: Normal rate and regular rhythm.     Heart sounds: No murmur heard. Pulmonary:     Effort: Pulmonary effort is normal.     Breath sounds: Normal breath sounds. No wheezing, rhonchi or rales.  Abdominal:     General: There is no distension.     Palpations: Abdomen is soft.     Tenderness: There is no abdominal tenderness.  Neurological:     Mental Status: She is alert.  Psychiatric:        Mood and Affect: Mood normal.        Behavior: Behavior normal.     Lab Results  Component Value Date   WBC 9.2 04/01/2022   HGB 12.2 04/01/2022   HCT 37.4 04/01/2022   PLT 367 04/01/2022   GLUCOSE 99 04/01/2022   CHOL 173 04/01/2022   TRIG 67 04/01/2022   HDL 39 (L) 04/01/2022   LDLCALC 121 (H) 04/01/2022   ALT 13 04/01/2022   AST 10 04/01/2022   NA 143 04/01/2022   K 3.9 04/01/2022   CL 104 04/01/2022   CREATININE 0.68 04/01/2022   BUN 10 04/01/2022   CO2 23 04/01/2022   TSH 1.31 04/30/2016   HGBA1C 5.6 04/01/2022     Assessment & Plan:   Problem List Items Addressed This Visit       Cardiovascular and Mediastinum   Essential hypertension, benign - Primary    BP mildly elevated today.  Continue current treatment with losartan and HCTZ.  We will  continue to follow closely.      Relevant Orders   CMP14+EGFR (Completed)     Other   Apnea    Arranging for sleep study.  Relevant Orders   Ambulatory referral to Sleep Studies   Diaphoresis    Patient has experienced nausea and associated diaphoresis.  EKG was obtained today and was independently interpreted by me.  Interpretation: Normal sinus rhythm with a rate of 64.  Normal axis.  Normal intervals.  T wave inversion noted in V2.  This is unchanged from her prior EKG.  Patient has an upcoming GI appointment.  Placing on Protonix.  Awaiting labs.      Relevant Orders   EKG 12-Lead (Completed)   Hyperlipidemia    Unsure of control.  Continue Lipitor.  Awaiting labs.      Relevant Orders   Lipid panel (Completed)   Morbid obesity ([place])   Relevant Orders   Hemoglobin A1c (Completed)   Other Visit Diagnoses     History of anemia       Relevant Orders   CBC (Completed)   Cutaneous skin tags       Relevant Orders   Ambulatory referral to Dermatology       Meds ordered this encounter  Medications   pantoprazole (PROTONIX) 40 MG tablet    Sig: Take 1 tablet (40 mg total) by mouth daily.    Dispense:  30 tablet    Refill:  [place]

## 2022-04-02 NOTE — Assessment & Plan Note (Signed)
Arranging for sleep study.

## 2022-04-02 NOTE — Assessment & Plan Note (Signed)
Patient has experienced nausea and associated diaphoresis.  EKG was obtained today and was independently interpreted by me.  Interpretation: Normal sinus rhythm with a rate of 64.  Normal axis.  Normal intervals.  T wave inversion noted in V2.  This is unchanged from her prior EKG.  Patient has an upcoming GI appointment.  Placing on Protonix.  Awaiting labs.

## 2022-04-02 NOTE — Assessment & Plan Note (Signed)
BP mildly elevated today.  Continue current treatment with losartan and HCTZ.  We will continue to follow closely.

## 2022-04-03 MED ORDER — ATORVASTATIN CALCIUM 80 MG PO TABS: 80.0000 mg | ORAL_TABLET | Freq: Every day | ORAL | 0 refills | Status: DC

## 2022-04-03 NOTE — Addendum Note (Signed)
Addended by: Dairl Ponder on: 04/03/2022 09:06 AM   Modules accepted: Orders

## 2022-04-10 ENCOUNTER — Encounter: Payer: Self-pay | Admitting: Internal Medicine

## 2022-04-10 ENCOUNTER — Telehealth: Payer: Self-pay

## 2022-04-10 ENCOUNTER — Ambulatory Visit (INDEPENDENT_AMBULATORY_CARE_PROVIDER_SITE_OTHER): Payer: Self-pay | Admitting: Internal Medicine

## 2022-04-10 ENCOUNTER — Telehealth: Payer: Self-pay | Admitting: *Deleted

## 2022-04-10 VITALS — BP 148/101 | HR 76 | Temp 98.4°F | Ht 64.5 in | Wt 238.7 lb

## 2022-04-10 DIAGNOSIS — R1013 Epigastric pain: Secondary | ICD-10-CM

## 2022-04-10 DIAGNOSIS — K6289 Other specified diseases of anus and rectum: Secondary | ICD-10-CM

## 2022-04-10 DIAGNOSIS — G8929 Other chronic pain: Secondary | ICD-10-CM

## 2022-04-10 DIAGNOSIS — R61 Generalized hyperhidrosis: Secondary | ICD-10-CM

## 2022-04-10 DIAGNOSIS — R110 Nausea: Secondary | ICD-10-CM

## 2022-04-10 MED ORDER — PEG 3350-KCL-NA BICARB-NACL 420 G PO SOLR: ORAL | 0 refills | Status: DC

## 2022-04-10 NOTE — Telephone Encounter (Signed)
Called pt. Scheduled for TCS/EGD with Dr. Abbey Chatters, ASA 3 on 7/10 at 2pm. Aware will mail instructions/pre-op appt. Rx sent to pharmacy

## 2022-04-10 NOTE — H&P (View-Only) (Signed)
Primary Care Physician:  Coral Spikes, DO Primary Gastroenterologist:  Dr. Abbey Chatters  Chief Complaint  Patient presents with   Rectal pressure    Patient referred for rectal pressure and itching. Went to ED in May for rectal pain. States this is off and on and currently not having any pain.     HPI:   Miranda Wade is a 41 y.o. female who presents to clinic today by referral from her PCP Dr. Lacinda Wade for evaluation.  She has multiple GI complaints for me today.  She states for months she has had issues with epigastric discomfort.  States after she eats she feels as though food "just sits" in her epigastric region.  Has had a few episodes of regurgitation.  Some intermittent nausea.  No true heartburn.  Was recently started on pantoprazole a week ago and states this is helping some.  No chronic NSAID use.  No melena hematochezia.  No previous upper endoscopy.  Also with rectal discomfort.  This is intermittent in nature, sometimes severe.  It got so bad it prompted a ER visit 03/07/2022.  DRE unable to be performed due to significant pain.  Rectal exam did mention excoriation in the gluteal cleft.  Has been using Anusol which is helped some.  Also on a stool softener.  No rectal bleeding.  Does note significant itching as well.  No family history of colorectal malignancy.  No previous colonoscopy  Past Medical History:  Diagnosis Date   Anemia    Essential hypertension    Hyperlipidemia    Seasonal allergies     Past Surgical History:  Procedure Laterality Date   MASS EXCISION Right 08/04/2017   Procedure: EXCISION OF RIGHT NASAL  MASS;  Surgeon: Leta Baptist, MD;  Location: [place];  Service: ENT;  Laterality: Right;  LOCAL   WRIST SURGERY      Current Outpatient Medications  Medication Sig Dispense Refill   atorvastatin (LIPITOR) 80 MG tablet Take 1 tablet (80 mg total) by mouth daily. 90 tablet 0   etonogestrel (NEXPLANON) 68 MG IMPL implant 1 each by Subdermal route  once.     hydrochlorothiazide (HYDRODIURIL) 12.5 MG tablet Take 1 tablet (12.5 mg total) by mouth daily. 90 tablet 1   ibuprofen (ADVIL) 600 MG tablet ibuprofen 600 mg tablet  TAKE 1 TABLET BY MOUTH EVERY 6 TO 8 HOURS AS DIRECTED AS NEEDED     losartan (COZAAR) 50 MG tablet Take 1 tablet (50 mg total) by mouth daily. 90 tablet 1   pantoprazole (PROTONIX) 40 MG tablet Take 1 tablet (40 mg total) by mouth daily. 30 tablet 3   No current facility-administered medications for this visit.    Allergies as of 04/10/2022   (No Known Allergies)    Family History  Problem Relation Age of Onset   Diabetes Mother    Hypertension Mother    Arthritis Mother    Hyperlipidemia Mother    Stroke Mother    Hypertension Father    Heart disease Father 25       Valve replacement   Hyperlipidemia Father    Asthma Daughter    Hypertension Brother    Stroke Maternal Grandmother    Allergies Daughter     Social History   Socioeconomic History   Marital status: Married    Spouse name: [name]   Number of children: 2   Years of education: 14   Highest education level: Not on file  Occupational History  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[employer]  Tobacco Use   Smoking status: Former    Packs/day: 0.50    Types: Cigarettes    Passive exposure: Current   Smokeless tobacco: Never  Vaping Use   Vaping Use: Never used  Substance and Sexual Activity   Alcohol use: No   Drug use: No   Sexual activity: Yes    Birth control/protection: Implant  Other Topics Concern   Not on file  Social History Narrative   Lives daughter [name] is in college, home some weekends   Currently separated from husband   Social Determinants of Health   Financial Resource Strain: Low Risk  (03/26/2022)   Overall Financial Resource Strain (CARDIA)    Difficulty of Paying Living Expenses: Not very hard  Food Insecurity: No Food Insecurity (03/26/2022)   Hunger Vital Sign    Worried About Running Out of  Food in the Last Year: Never true    [place] in the Last Year: Never true  Transportation Needs: No Transportation Needs (03/26/2022)   PRAPARE - Hydrologist (Medical): No    Lack of Transportation (Non-Medical): No  Physical Activity: Insufficiently Active (03/26/2022)   Exercise Vital Sign    Days of Exercise per Week: 3 days    Minutes of Exercise per Session: 30 min  Stress: No Stress Concern Present (03/26/2022)   [place]    Feeling of Stress : Not at all  Social Connections: Moderately Integrated (03/26/2022)   Social Connection and Isolation Panel [NHANES]    Frequency of Communication with Friends and Family: More than three times a week    Frequency of Social Gatherings with Friends and Family: More than three times a week    Attends Religious Services: 1 to 4 times per year    Active Member of [employer] or Organizations: No    Attends Archivist Meetings: Never    Marital Status: Married  Human resources officer Violence: Not At Risk (03/26/2022)   Humiliation, Afraid, Rape, and Kick questionnaire    Fear of Current or Ex-Partner: No    Emotionally Abused: No    Physically Abused: No    Sexually Abused: No    Subjective: Review of Systems  Constitutional:  Negative for chills and fever.  HENT:  Negative for congestion and hearing loss.   Eyes:  Negative for blurred vision and double vision.  Respiratory:  Negative for cough and shortness of breath.   Cardiovascular:  Negative for chest pain and palpitations.  Gastrointestinal:  Positive for abdominal pain and nausea. Negative for blood in stool, constipation, diarrhea, heartburn, melena and vomiting.       Rectal pain  Genitourinary:  Negative for dysuria and urgency.  Musculoskeletal:  Negative for joint pain and myalgias.  Skin:  Negative for itching and rash.  Neurological:  Negative for dizziness and headaches.   Psychiatric/Behavioral:  Negative for depression. The patient is not nervous/anxious.       Objective: BP (!) 148/101 (BP Location: Left Arm, Patient Position: Sitting, Cuff Size: Large)   Pulse 76   Temp 98.4 F (36.9 C) (Oral)   Ht 5' 4.5" (1.638 m)   Wt 238 lb 11.2 oz (108.3 kg)   BMI 40.34 kg/m  Physical Exam Constitutional:      Appearance: Normal appearance.  HENT:     Head: Normocephalic and atraumatic.  Eyes:  Extraocular Movements: Extraocular movements intact.     Conjunctiva/sclera: Conjunctivae normal.  Cardiovascular:     Rate and Rhythm: Normal rate and regular rhythm.  Pulmonary:     Effort: Pulmonary effort is normal.     Breath sounds: Normal breath sounds.  Abdominal:     General: Bowel sounds are normal.     Palpations: Abdomen is soft.  Musculoskeletal:        General: No swelling. Normal range of motion.     Cervical back: Normal range of motion and neck supple.  Skin:    General: Skin is warm and dry.     Coloration: Skin is not jaundiced.  Neurological:     General: No focal deficit present.     Mental Status: She is alert and oriented to person, place, and time.  Psychiatric:        Mood and Affect: Mood normal.        Behavior: Behavior normal.   Rectal exam: No obvious external hemorrhoids or fistula or fissure.  No excoriation in the perianal area.  DRE normal.   Assessment/plan:  1.  Epigastric pain-chronic, feels as though food gets stuck in her substernal region.  Unclear if true esophageal dysphagia or not. Will schedule for EGD to evaluate for peptic ulcer disease, esophagitis, gastritis, H. Pylori, duodenitis, or other. Will also evaluate for esophageal stricture, Schatzki's ring, esophageal web or other.  Continue on pantoprazole daily for now.  2.  Rectal pain/discomfort-rectal exam and DRE today WNL.  Given chronicity of symptoms, will perform colonoscopy at same time as EGD to further evaluate.  The risks including  infection, bleed, or perforation as well as benefits, limitations, alternatives and imponderables have been reviewed with the patient. Potential for esophageal dilation, biopsy, etc. have also been reviewed.  Questions have been answered. All parties agreeable.  3.  Nausea-EGD as above  Thank you Dr. Lacinda Wade for the kind referral.    04/10/2022 9:02 AM   Disclaimer: This note was dictated with voice recognition software. Similar sounding words can inadvertently be transcribed and may not be corrected upon review.

## 2022-04-10 NOTE — Progress Notes (Signed)
Primary Care Physician:  Coral Spikes, DO Primary Gastroenterologist:  Dr. Abbey Chatters  Chief Complaint  Patient presents with   Rectal pressure    Patient referred for rectal pressure and itching. Went to ED in May for rectal pain. States this is off and on and currently not having any pain.     HPI:   Miranda Wade is a 41 y.o. female who presents to clinic today by referral from her PCP Dr. Lacinda Axon for evaluation.  She has multiple GI complaints for me today.  She states for months she has had issues with epigastric discomfort.  States after she eats she feels as though food "just sits" in her epigastric region.  Has had a few episodes of regurgitation.  Some intermittent nausea.  No true heartburn.  Was recently started on pantoprazole a week ago and states this is helping some.  No chronic NSAID use.  No melena hematochezia.  No previous upper endoscopy.  Also with rectal discomfort.  This is intermittent in nature, sometimes severe.  It got so bad it prompted a ER visit 03/07/2022.  DRE unable to be performed due to significant pain.  Rectal exam did mention excoriation in the gluteal cleft.  Has been using Anusol which is helped some.  Also on a stool softener.  No rectal bleeding.  Does note significant itching as well.  No family history of colorectal malignancy.  No previous colonoscopy  Past Medical History:  Diagnosis Date   Anemia    Essential hypertension    Hyperlipidemia    Seasonal allergies     Past Surgical History:  Procedure Laterality Date   MASS EXCISION Right 08/04/2017   Procedure: EXCISION OF RIGHT NASAL  MASS;  Surgeon: Leta Baptist, MD;  Location: [place];  Service: ENT;  Laterality: Right;  LOCAL   WRIST SURGERY      Current Outpatient Medications  Medication Sig Dispense Refill   atorvastatin (LIPITOR) 80 MG tablet Take 1 tablet (80 mg total) by mouth daily. 90 tablet 0   etonogestrel (NEXPLANON) 68 MG IMPL implant 1 each by Subdermal route  once.     hydrochlorothiazide (HYDRODIURIL) 12.5 MG tablet Take 1 tablet (12.5 mg total) by mouth daily. 90 tablet 1   ibuprofen (ADVIL) 600 MG tablet ibuprofen 600 mg tablet  TAKE 1 TABLET BY MOUTH EVERY 6 TO 8 HOURS AS DIRECTED AS NEEDED     losartan (COZAAR) 50 MG tablet Take 1 tablet (50 mg total) by mouth daily. 90 tablet 1   pantoprazole (PROTONIX) 40 MG tablet Take 1 tablet (40 mg total) by mouth daily. 30 tablet 3   No current facility-administered medications for this visit.    Allergies as of 04/10/2022   (No Known Allergies)    Family History  Problem Relation Age of Onset   Diabetes Mother    Hypertension Mother    Arthritis Mother    Hyperlipidemia Mother    Stroke Mother    Hypertension Father    Heart disease Father 35       Valve replacement   Hyperlipidemia Father    Asthma Daughter    Hypertension Brother    Stroke Maternal Grandmother    Allergies Daughter     Social History   Socioeconomic History   Marital status: Married    Spouse name: [name]   Number of children: 2   Years of education: 14   Highest education level: Not on file  Occupational History  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[employer]  Tobacco Use   Smoking status: Former    Packs/day: 0.50    Types: Cigarettes    Passive exposure: Current   Smokeless tobacco: Never  Vaping Use   Vaping Use: Never used  Substance and Sexual Activity   Alcohol use: No   Drug use: No   Sexual activity: Yes    Birth control/protection: Implant  Other Topics Concern   Not on file  Social History Narrative   Lives daughter [name] is in college, home some weekends   Currently separated from husband   Social Determinants of Health   Financial Resource Strain: Low Risk  (03/26/2022)   Overall Financial Resource Strain (CARDIA)    Difficulty of Paying Living Expenses: Not very hard  Food Insecurity: No Food Insecurity (03/26/2022)   Hunger Vital Sign    Worried About Running Out of  Food in the Last Year: Never true    [place] in the Last Year: Never true  Transportation Needs: No Transportation Needs (03/26/2022)   PRAPARE - Hydrologist (Medical): No    Lack of Transportation (Non-Medical): No  Physical Activity: Insufficiently Active (03/26/2022)   Exercise Vital Sign    Days of Exercise per Week: 3 days    Minutes of Exercise per Session: 30 min  Stress: No Stress Concern Present (03/26/2022)   [place]    Feeling of Stress : Not at all  Social Connections: Moderately Integrated (03/26/2022)   Social Connection and Isolation Panel [NHANES]    Frequency of Communication with Friends and Family: More than three times a week    Frequency of Social Gatherings with Friends and Family: More than three times a week    Attends Religious Services: 1 to 4 times per year    Active Member of [employer] or Organizations: No    Attends Archivist Meetings: Never    Marital Status: Married  Human resources officer Violence: Not At Risk (03/26/2022)   Humiliation, Afraid, Rape, and Kick questionnaire    Fear of Current or Ex-Partner: No    Emotionally Abused: No    Physically Abused: No    Sexually Abused: No    Subjective: Review of Systems  Constitutional:  Negative for chills and fever.  HENT:  Negative for congestion and hearing loss.   Eyes:  Negative for blurred vision and double vision.  Respiratory:  Negative for cough and shortness of breath.   Cardiovascular:  Negative for chest pain and palpitations.  Gastrointestinal:  Positive for abdominal pain and nausea. Negative for blood in stool, constipation, diarrhea, heartburn, melena and vomiting.       Rectal pain  Genitourinary:  Negative for dysuria and urgency.  Musculoskeletal:  Negative for joint pain and myalgias.  Skin:  Negative for itching and rash.  Neurological:  Negative for dizziness and headaches.   Psychiatric/Behavioral:  Negative for depression. The patient is not nervous/anxious.       Objective: BP (!) 148/101 (BP Location: Left Arm, Patient Position: Sitting, Cuff Size: Large)   Pulse 76   Temp 98.4 F (36.9 C) (Oral)   Ht 5' 4.5" (1.638 m)   Wt 238 lb 11.2 oz (108.3 kg)   BMI 40.34 kg/m  Physical Exam Constitutional:      Appearance: Normal appearance.  HENT:     Head: Normocephalic and atraumatic.  Eyes:  Extraocular Movements: Extraocular movements intact.     Conjunctiva/sclera: Conjunctivae normal.  Cardiovascular:     Rate and Rhythm: Normal rate and regular rhythm.  Pulmonary:     Effort: Pulmonary effort is normal.     Breath sounds: Normal breath sounds.  Abdominal:     General: Bowel sounds are normal.     Palpations: Abdomen is soft.  Musculoskeletal:        General: No swelling. Normal range of motion.     Cervical back: Normal range of motion and neck supple.  Skin:    General: Skin is warm and dry.     Coloration: Skin is not jaundiced.  Neurological:     General: No focal deficit present.     Mental Status: She is alert and oriented to person, place, and time.  Psychiatric:        Mood and Affect: Mood normal.        Behavior: Behavior normal.   Rectal exam: No obvious external hemorrhoids or fistula or fissure.  No excoriation in the perianal area.  DRE normal.   Assessment/plan:  1.  Epigastric pain-chronic, feels as though food gets stuck in her substernal region.  Unclear if true esophageal dysphagia or not. Will schedule for EGD to evaluate for peptic ulcer disease, esophagitis, gastritis, H. Pylori, duodenitis, or other. Will also evaluate for esophageal stricture, Schatzki's ring, esophageal web or other.  Continue on pantoprazole daily for now.  2.  Rectal pain/discomfort-rectal exam and DRE today WNL.  Given chronicity of symptoms, will perform colonoscopy at same time as EGD to further evaluate.  The risks including  infection, bleed, or perforation as well as benefits, limitations, alternatives and imponderables have been reviewed with the patient. Potential for esophageal dilation, biopsy, etc. have also been reviewed.  Questions have been answered. All parties agreeable.  3.  Nausea-EGD as above  Thank you Dr. Lacinda Axon for the kind referral.    04/10/2022 9:02 AM   Disclaimer: This note was dictated with voice recognition software. Similar sounding words can inadvertently be transcribed and may not be corrected upon review.

## 2022-04-10 NOTE — Patient Instructions (Signed)
We will schedule you for upper endoscopy to further evaluate your epigastric discomfort and nausea.  Continue on pantoprazole daily for now.  At the same time we will perform colonoscopy to evaluate your chronic rectal discomfort.  Continue on stool softener.  Be sure to drink at least 4 to 6 glasses of water daily.  It was very nice meeting you today.  Dr. Abbey Chatters  At [facility] we value your feedback. You may receive a survey about your visit today. Please share your experience as we strive to create trusting relationships with our patients to provide genuine, compassionate, quality care.  We appreciate your understanding and patience as we review any laboratory studies, imaging, and other diagnostic tests that are ordered as we care for you. Our office policy is 5 business days for review of these results, and any emergent or urgent results are addressed in a timely manner for your best interest. If you do not hear from our office in 1 week, please contact us.   We also encourage the use of MyChart, which contains your medical information for your review as well. If you are not enrolled in this feature, an access code is on this after visit summary for your convenience. Thank you for allowing [place] to be involved in your care.  It was great to see you today!  I hope you have a great rest of your Spring!    Elon Alas. Abbey Chatters, D.O. Gastroenterology and Hepatology [facility]

## 2022-04-10 NOTE — Telephone Encounter (Signed)
Called [employer] and was advised no PA is required for procedures. Call ref# [id number]

## 2022-04-11 NOTE — Telephone Encounter (Signed)
GOOD TO GO

## 2022-04-24 ENCOUNTER — Other Ambulatory Visit: Payer: Self-pay | Admitting: Obstetrics & Gynecology

## 2022-04-24 ENCOUNTER — Encounter: Payer: Self-pay | Admitting: Obstetrics & Gynecology

## 2022-04-25 NOTE — Patient Instructions (Signed)
Miranda Wade  04/25/2022     '@[email or web address]'$ @   Your procedure is scheduled on  04/29/2022.   Report to [facility] at  1200  P.M.   Call this number if you have problems the morning of surgery:  [phone]   Remember:  Follow the diet and prep instructions given to you by the office.    Take these medicines the morning of surgery with A SIP OF WATER                                           Protonix.    Do not wear jewelry, make-up or nail polish.  Do not wear lotions, powders, or perfumes, or deodorant.  Do not shave 48 hours prior to surgery.  Men may shave face and neck.  Do not bring valuables to the hospital.  [facility] is not responsible for any belongings or valuables.  Contacts, dentures or bridgework may not be worn into surgery.  Leave your suitcase in the car.  After surgery it may be brought to your room.  For patients admitted to the hospital, discharge time will be determined by your treatment team.  Patients discharged the day of surgery will not be allowed to drive home and must have someone with them for 24 hours.    Special instructions:   DO NOT smoke tobacco or vape for 24 hours before your procedure.  Please read over the following fact sheets that you were given. Anesthesia Post-op Instructions and Care and Recovery After Surgery      Upper Endoscopy, Adult, Care After This sheet gives you information about how to care for yourself after your procedure. Your health care provider may also give you more specific instructions. If you have problems or questions, contact your health care provider. What can I expect after the procedure? After the procedure, it is common to have: A sore throat. Mild stomach pain or discomfort. Bloating. Nausea. Follow these instructions at home:  Follow instructions from your health care provider about what to eat or drink after your procedure. Return to your normal activities as told by your  health care provider. Ask your health care provider what activities are safe for you. Take over-the-counter and prescription medicines only as told by your health care provider. If you were given a sedative during the procedure, it can affect you for several hours. Do not drive or operate machinery until your health care provider says that it is safe. Keep all follow-up visits as told by your health care provider. This is important. Contact a health care provider if you have: A sore throat that lasts longer than one day. Trouble swallowing. Get help right away if: You vomit blood or your vomit looks like coffee grounds. You have: A fever. Bloody, black, or tarry stools. A severe sore throat or you cannot swallow. Difficulty breathing. Severe pain in your chest or abdomen. Summary After the procedure, it is common to have a sore throat, mild stomach discomfort, bloating, and nausea. If you were given a sedative during the procedure, it can affect you for several hours. Do not drive or operate machinery until your health care provider says that it is safe. Follow instructions from your health care provider about what to eat or drink after your procedure. Return to your normal activities as told by  your health care provider. This information is not intended to replace advice given to you by your health care provider. Make sure you discuss any questions you have with your health care provider. Document Revised: 08/13/2019 Document Reviewed: 03/09/2018 Elsevier Patient Education  [place]. Colonoscopy, Adult, Care After The following information offers guidance on how to care for yourself after your procedure. Your health care provider may also give you more specific instructions. If you have problems or questions, contact your health care provider. What can I expect after the procedure? After the procedure, it is common to have: A small amount of blood in your stool for 24 hours  after the procedure. Some gas. Mild cramping or bloating of your abdomen. Follow these instructions at home: Eating and drinking  Drink enough fluid to keep your urine pale yellow. Follow instructions from your health care provider about eating or drinking restrictions. Resume your normal diet as told by your health care provider. Avoid heavy or fried foods that are hard to digest. Activity Rest as told by your health care provider. Avoid sitting for a long time without moving. Get up to take short walks every 1-2 hours. This is important to improve blood flow and breathing. Ask for help if you feel weak or unsteady. Return to your normal activities as told by your health care provider. Ask your health care provider what activities are safe for you. Managing cramping and bloating  Try walking around when you have cramps or feel bloated. If directed, apply heat to your abdomen as told by your health care provider. Use the heat source that your health care provider recommends, such as a moist heat pack or a heating pad. Place a towel between your skin and the heat source. Leave the heat on for 20-30 minutes. Remove the heat if your skin turns bright red. This is especially important if you are unable to feel pain, heat, or cold. You have a greater risk of getting burned. General instructions If you were given a sedative during the procedure, it can affect you for several hours. Do not drive or operate machinery until your health care provider says that it is safe. For the first 24 hours after the procedure: Do not sign important documents. Do not drink alcohol. Do your regular daily activities at a slower pace than normal. Eat soft foods that are easy to digest. Take over-the-counter and prescription medicines only as told by your health care provider. Keep all follow-up visits. This is important. Contact a health care provider if: You have blood in your stool 2-3 days after the  procedure. Get help right away if: You have more than a small spotting of blood in your stool. You have large blood clots in your stool. You have swelling of your abdomen. You have nausea or vomiting. You have a fever. You have increasing pain in your abdomen that is not relieved with medicine. These symptoms may be an emergency. Get help right away. Call 911. Do not wait to see if the symptoms will go away. Do not drive yourself to the hospital. Summary After the procedure, it is common to have a small amount of blood in your stool. You may also have mild cramping and bloating of your abdomen. If you were given a sedative during the procedure, it can affect you for several hours. Do not drive or operate machinery until your health care provider says that it is safe. Get help right away if you have a lot  of blood in your stool, nausea or vomiting, a fever, or increased pain in your abdomen. This information is not intended to replace advice given to you by your health care provider. Make sure you discuss any questions you have with your health care provider. Document Revised: 05/30/2021 Document Reviewed: 05/30/2021 Elsevier Patient Education  [place] After This sheet gives you information about how to care for yourself after your procedure. Your health care provider may also give you more specific instructions. If you have problems or questions, contact your health care provider. What can I expect after the procedure? After the procedure, it is common to have: Tiredness. Forgetfulness about what happened after the procedure. Impaired judgment for important decisions. Nausea or vomiting. Some difficulty with balance. Follow these instructions at home: For the time period you were told by your health care provider:     Rest as needed. Do not participate in activities where you could fall or become injured. Do not drive or use machinery. Do  not drink alcohol. Do not take sleeping pills or medicines that cause drowsiness. Do not make important decisions or sign legal documents. Do not take care of children on your own. Eating and drinking Follow the diet that is recommended by your health care provider. Drink enough fluid to keep your urine pale yellow. If you vomit: Drink water, juice, or soup when you can drink without vomiting. Make sure you have little or no nausea before eating solid foods. General instructions Have a responsible adult stay with you for the time you are told. It is important to have someone help care for you until you are awake and alert. Take over-the-counter and prescription medicines only as told by your health care provider. If you have sleep apnea, surgery and certain medicines can increase your risk for breathing problems. Follow instructions from your health care provider about wearing your sleep device: Anytime you are sleeping, including during daytime naps. While taking prescription pain medicines, sleeping medicines, or medicines that make you drowsy. Avoid smoking. Keep all follow-up visits as told by your health care provider. This is important. Contact a health care provider if: You keep feeling nauseous or you keep vomiting. You feel light-headed. You are still sleepy or having trouble with balance after 24 hours. You develop a rash. You have a fever. You have redness or swelling around the IV site. Get help right away if: You have trouble breathing. You have new-onset confusion at home. Summary For several hours after your procedure, you may feel tired. You may also be forgetful and have poor judgment. Have a responsible adult stay with you for the time you are told. It is important to have someone help care for you until you are awake and alert. Rest as told. Do not drive or operate machinery. Do not drink alcohol or take sleeping pills. Get help right away if you have trouble  breathing, or if you suddenly become confused. This information is not intended to replace advice given to you by your health care provider. Make sure you discuss any questions you have with your health care provider. Document Revised: 09/11/2021 Document Reviewed: 09/09/2019 Elsevier Patient Education  [place].

## 2022-04-25 NOTE — Pre-Procedure Instructions (Signed)
Messaged Dr Elonda Husky and he is okay with using the CBC and COMP panel patient had drawn on 04/01/2022.

## 2022-04-25 NOTE — Patient Instructions (Signed)
Miranda Wade  04/25/2022     '@PREFPERIOPPHARMACY'$ @   Your procedure is scheduled on  05/01/2022.   Report to [facility] at  [place].[facility].   Call this number if you have problems the morning of surgery:  [phone]   Remember:  Do not eat after midnight.    You may drink clear liquids until 0640 am on 05/01/2022.       At 0640 AM drink your carb drink. You can have nothing else to drink after this.    Take these medicines the morning of surgery with A SIP OF WATER                                         protonix.     Do not wear jewelry, make-up or nail polish.  Do not wear lotions, powders, or perfumes, or deodorant.  Do not shave 48 hours prior to surgery.  Men may shave face and neck.  Do not bring valuables to the hospital.  [facility] is not responsible for any belongings or valuables.  Contacts, dentures or bridgework may not be worn into surgery.  Leave your suitcase in the car.  After surgery it may be brought to your room.  For patients admitted to the hospital, discharge time will be determined by your treatment team.  Patients discharged the day of surgery will not be allowed to drive home and must have someone with them for 24 hours.    Special instructions:   DO NOT smoke tobacco or vape for 24 hours before your procedure.  Please read over the following fact sheets that you were given. Coughing and Deep Breathing, Surgical Site Infection Prevention, Anesthesia Post-op Instructions, and Care and Recovery After Surgery      Salpingectomy, Care After The following information offers guidance on how to care for yourself after your procedure. Your health care provider may also give you more specific instructions. If you have problems or questions, contact your health care provider. What can I expect after the procedure? After the procedure, it is common to have: Pain in your abdomen. Light vaginal bleeding (spotting) for a few  days. Tiredness. Your recovery time will depend on which method was used for your surgery. Follow these instructions at home: Medicines Take over-the-counter and prescription medicines only as told by your health care provider. Ask your health care provider if the medicine prescribed to you: Requires you to avoid driving or using machinery. Can cause constipation. You may need to take actions to prevent or treat constipation, such as: Drink enough fluid to keep your urine pale yellow. Take over-the-counter or prescription medicines. Eat foods that are high in fiber, such as beans, whole grains, and fresh fruits and vegetables. Limit foods that are high in fat and processed sugars, such as fried or sweet foods. Incision care  Follow instructions from your health care provider about how to take care of your incision or incisions. Make sure you: Wash your hands with soap and water for at least 20 seconds before and after you change your bandage (dressing). If soap and water are not available, use hand sanitizer. Change or remove your dressing as told by your health care provider. Leave stitches (sutures), skin glue, staples, or adhesive strips in place. These skin closures may need to stay in place for 2 weeks  or longer. If adhesive strip edges start to loosen and curl up, you may trim the loose edges. Do not remove adhesive strips completely unless your health care provider tells you to do that. Keep your dressing clean and dry. Check your incision area every day for signs of infection. Check for: Redness, swelling, or pain that gets worse. Fluid or blood. Warmth. Pus or a bad smell. Activity Rest as told by your health care provider. Avoid sitting for a long time without moving. Get up to take short walks every 1-2 hours. This is important to improve blood flow and breathing. Ask for help if you feel weak or unsteady. Return to your normal activities as told by your health care provider.  Ask your health care provider what activities are safe for you. Do not drive until your health care provider says that it is safe. Do not lift anything that is heavier than 10 lb (4.5 kg), or the limit that you are told, until your health care provider says that it is safe. This may last for 2-6 weeks depending on your surgery. Do not douche, use tampons, or have sex until your health care provider approves. General instructions Do not use any products that contain nicotine or tobacco. These products include cigarettes, chewing tobacco, and vaping devices, such as e-cigarettes. These can delay healing after surgery. If you need help quitting, ask your health care provider. Wear compression stockings as told by your health care provider. These stockings help to prevent blood clots and reduce swelling in your legs. Do not take baths, swim, or use a hot tub until your health care provider approves. You may take showers. Keep all follow-up visits. This is important. Contact a health care provider if: You have pain when you urinate. You have redness, swelling, or more pain around an incision or an incision feels warm to the touch. You have pus, fluid, blood, or a bad smell coming from an incision or an incision starts to open. You have a fever. You have abdominal pain that gets worse or does not get better with medicine. You have a rash. You feel light-headed, have nausea and vomiting, or both. Get help right away if: You have pain in your chest or leg. You develop shortness of breath. You faint. You have increased or heavy vaginal bleeding, such as soaking a sanitary napkin in an hour. These symptoms may represent a serious problem that is an emergency. Do not wait to see if the symptoms will go away. Get medical help right away. Call your local emergency services (911 in the [place] Do not drive yourself to the hospital. Summary After the procedure, it is common to feel tired, have pain in your  abdomen, and have light vaginal bleeding for a few days. Follow instructions from your health care provider about how to take care of your incision or incisions. Return to your normal activities as told by your health care provider. Ask your health care provider what activities are safe for you. Do not douche, use tampons, or have sex until your health care provider approves. Keep all follow-up visits. This is important. This information is not intended to replace advice given to you by your health care provider. Make sure you discuss any questions you have with your health care provider. Document Revised: 08/29/2020 Document Reviewed: 08/29/2020 Elsevier Patient Education  [place] Anesthesia, Adult, Care After This sheet gives you information about how to care for yourself after your procedure. Your health  care provider may also give you more specific instructions. If you have problems or questions, contact your health care provider. What can I expect after the procedure? After the procedure, the following side effects are common: Pain or discomfort at the IV site. Nausea. Vomiting. Sore throat. Trouble concentrating. Feeling cold or chills. Feeling weak or tired. Sleepiness and fatigue. Soreness and body aches. These side effects can affect parts of the body that were not involved in surgery. Follow these instructions at home: For the time period you were told by your health care provider:  Rest. Do not participate in activities where you could fall or become injured. Do not drive or use machinery. Do not drink alcohol. Do not take sleeping pills or medicines that cause drowsiness. Do not make important decisions or sign legal documents. Do not take care of children on your own. Eating and drinking Follow any instructions from your health care provider about eating or drinking restrictions. When you feel hungry, start by eating small amounts of foods that are  soft and easy to digest (bland), such as toast. Gradually return to your regular diet. Drink enough fluid to keep your urine pale yellow. If you vomit, rehydrate by drinking water, juice, or clear broth. General instructions If you have sleep apnea, surgery and certain medicines can increase your risk for breathing problems. Follow instructions from your health care provider about wearing your sleep device: Anytime you are sleeping, including during daytime naps. While taking prescription pain medicines, sleeping medicines, or medicines that make you drowsy. Have a responsible adult stay with you for the time you are told. It is important to have someone help care for you until you are awake and alert. Return to your normal activities as told by your health care provider. Ask your health care provider what activities are safe for you. Take over-the-counter and prescription medicines only as told by your health care provider. If you smoke, do not smoke without supervision. Keep all follow-up visits as told by your health care provider. This is important. Contact a health care provider if: You have nausea or vomiting that does not get better with medicine. You cannot eat or drink without vomiting. You have pain that does not get better with medicine. You are unable to pass urine. You develop a skin rash. You have a fever. You have redness around your IV site that gets worse. Get help right away if: You have difficulty breathing. You have chest pain. You have blood in your urine or stool, or you vomit blood. Summary After the procedure, it is common to have a sore throat or nausea. It is also common to feel tired. Have a responsible adult stay with you for the time you are told. It is important to have someone help care for you until you are awake and alert. When you feel hungry, start by eating small amounts of foods that are soft and easy to digest (bland), such as toast. Gradually return  to your regular diet. Drink enough fluid to keep your urine pale yellow. Return to your normal activities as told by your health care provider. Ask your health care provider what activities are safe for you. This information is not intended to replace advice given to you by your health care provider. Make sure you discuss any questions you have with your health care provider. Document Revised: 06/22/2020 Document Reviewed: 01/20/2020 Elsevier Patient Education  [place]. How to Use Chlorhexidine for Bathing Chlorhexidine gluconate (CHG) is a  germ-killing (antiseptic) solution that is used to clean the skin. It can get rid of the bacteria that normally live on the skin and can keep them away for about 24 hours. To clean your skin with CHG, you may be given: A CHG solution to use in the shower or as part of a sponge bath. A prepackaged cloth that contains CHG. Cleaning your skin with CHG may help lower the risk for infection: While you are staying in the intensive care unit of the hospital. If you have a vascular access, such as a central line, to provide short-term or long-term access to your veins. If you have a catheter to drain urine from your bladder. If you are on a ventilator. A ventilator is a machine that helps you breathe by moving air in and out of your lungs. After surgery. What are the risks? Risks of using CHG include: A skin reaction. Hearing loss, if CHG gets in your ears and you have a perforated eardrum. Eye injury, if CHG gets in your eyes and is not rinsed out. The CHG product catching fire. Make sure that you avoid smoking and flames after applying CHG to your skin. Do not use CHG: If you have a chlorhexidine allergy or have previously reacted to chlorhexidine. On babies younger than 57 months of age. How to use CHG solution Use CHG only as told by your health care provider, and follow the instructions on the label. Use the full amount of CHG as directed.  Usually, this is one bottle. During a shower Follow these steps when using CHG solution during a shower (unless your health care provider gives you different instructions): Start the shower. Use your normal soap and shampoo to wash your face and hair. Turn off the shower or move out of the shower stream. Pour the CHG onto a clean washcloth. Do not use any type of brush or rough-edged sponge. Starting at your neck, lather your body down to your toes. Make sure you follow these instructions: If you will be having surgery, pay special attention to the part of your body where you will be having surgery. Scrub this area for at least 1 minute. Do not use CHG on your head or face. If the solution gets into your ears or eyes, rinse them well with water. Avoid your genital area. Avoid any areas of skin that have broken skin, cuts, or scrapes. Scrub your back and under your arms. Make sure to wash skin folds. Let the lather sit on your skin for 1-2 minutes or as long as told by your health care provider. Thoroughly rinse your entire body in the shower. Make sure that all body creases and crevices are rinsed well. Dry off with a clean towel. Do not put any substances on your body afterward--such as powder, lotion, or perfume--unless you are told to do so by your health care provider. Only use lotions that are recommended by the manufacturer. Put on clean clothes or pajamas. If it is the night before your surgery, sleep in clean sheets.  During a sponge bath Follow these steps when using CHG solution during a sponge bath (unless your health care provider gives you different instructions): Use your normal soap and shampoo to wash your face and hair. Pour the CHG onto a clean washcloth. Starting at your neck, lather your body down to your toes. Make sure you follow these instructions: If you will be having surgery, pay special attention to the part of your body where you  will be having surgery. Scrub this  area for at least 1 minute. Do not use CHG on your head or face. If the solution gets into your ears or eyes, rinse them well with water. Avoid your genital area. Avoid any areas of skin that have broken skin, cuts, or scrapes. Scrub your back and under your arms. Make sure to wash skin folds. Let the lather sit on your skin for 1-2 minutes or as long as told by your health care provider. Using a different clean, wet washcloth, thoroughly rinse your entire body. Make sure that all body creases and crevices are rinsed well. Dry off with a clean towel. Do not put any substances on your body afterward--such as powder, lotion, or perfume--unless you are told to do so by your health care provider. Only use lotions that are recommended by the manufacturer. Put on clean clothes or pajamas. If it is the night before your surgery, sleep in clean sheets. How to use CHG prepackaged cloths Only use CHG cloths as told by your health care provider, and follow the instructions on the label. Use the CHG cloth on clean, dry skin. Do not use the CHG cloth on your head or face unless your health care provider tells you to. When washing with the CHG cloth: Avoid your genital area. Avoid any areas of skin that have broken skin, cuts, or scrapes. Before surgery Follow these steps when using a CHG cloth to clean before surgery (unless your health care provider gives you different instructions): Using the CHG cloth, vigorously scrub the part of your body where you will be having surgery. Scrub using a back-and-forth motion for 3 minutes. The area on your body should be completely wet with CHG when you are done scrubbing. Do not rinse. Discard the cloth and let the area air-dry. Do not put any substances on the area afterward, such as powder, lotion, or perfume. Put on clean clothes or pajamas. If it is the night before your surgery, sleep in clean sheets.  For general bathing Follow these steps when using CHG  cloths for general bathing (unless your health care provider gives you different instructions). Use a separate CHG cloth for each area of your body. Make sure you wash between any folds of skin and between your fingers and toes. Wash your body in the following order, switching to a new cloth after each step: The front of your neck, shoulders, and chest. Both of your arms, under your arms, and your hands. Your stomach and groin area, avoiding the genitals. Your right leg and foot. Your left leg and foot. The back of your neck, your back, and your buttocks. Do not rinse. Discard the cloth and let the area air-dry. Do not put any substances on your body afterward--such as powder, lotion, or perfume--unless you are told to do so by your health care provider. Only use lotions that are recommended by the manufacturer. Put on clean clothes or pajamas. Contact a health care provider if: Your skin gets irritated after scrubbing. You have questions about using your solution or cloth. You swallow any chlorhexidine. Call your local poison control center (1-(229)005-1054 in the [place] Get help right away if: Your eyes itch badly, or they become very red or swollen. Your skin itches badly and is red or swollen. Your hearing changes. You have trouble seeing. You have swelling or tingling in your mouth or throat. You have trouble breathing. These symptoms may represent a serious problem that is an emergency.  Do not wait to see if the symptoms will go away. Get medical help right away. Call your local emergency services (911 in the [place] Do not drive yourself to the hospital. Summary Chlorhexidine gluconate (CHG) is a germ-killing (antiseptic) solution that is used to clean the skin. Cleaning your skin with CHG may help to lower your risk for infection. You may be given CHG to use for bathing. It may be in a bottle or in a prepackaged cloth to use on your skin. Carefully follow your health care provider's  instructions and the instructions on the product label. Do not use CHG if you have a chlorhexidine allergy. Contact your health care provider if your skin gets irritated after scrubbing. This information is not intended to replace advice given to you by your health care provider. Make sure you discuss any questions you have with your health care provider. Document Revised: 12/18/2020 Document Reviewed: 12/18/2020 Elsevier Patient Education  [place].

## 2022-04-26 ENCOUNTER — Encounter (HOSPITAL_COMMUNITY): Payer: Self-pay

## 2022-04-26 ENCOUNTER — Encounter (HOSPITAL_COMMUNITY)
Admission: RE | Admit: 2022-04-26 | Discharge: 2022-04-26 | Disposition: A | Discharge status: Home / Self Care | Payer: No Typology Code available for payment source | Source: Ambulatory Visit | Attending: Obstetrics & Gynecology | Admitting: Obstetrics & Gynecology

## 2022-04-26 ENCOUNTER — Encounter (HOSPITAL_COMMUNITY)
Admission: RE | Admit: 2022-04-26 | Discharge: 2022-04-26 | Disposition: A | Discharge status: Home / Self Care | Payer: No Typology Code available for payment source | Source: Ambulatory Visit | Attending: Internal Medicine | Admitting: Internal Medicine

## 2022-04-26 DIAGNOSIS — Z01818 Encounter for other preprocedural examination: Secondary | ICD-10-CM | POA: Insufficient documentation

## 2022-04-26 LAB — URINALYSIS, ROUTINE W REFLEX MICROSCOPIC
Bilirubin Urine: NEGATIVE
Glucose, UA: NEGATIVE mg/dL
Hgb urine dipstick: NEGATIVE
Ketones, ur: NEGATIVE mg/dL
Nitrite: NEGATIVE
Protein, ur: NEGATIVE mg/dL
Specific Gravity, Urine: 1.02 (ref 1.005–1.030)
pH: 6 (ref 5.0–8.0)

## 2022-04-26 LAB — RAPID HIV SCREEN (HIV 1/2 AB+AG)
HIV 1/2 Antibodies: NONREACTIVE
HIV-1 P24 Antigen - HIV24: NONREACTIVE

## 2022-04-26 LAB — POCT PREGNANCY, URINE: Preg Test, Ur: NEGATIVE

## 2022-04-29 ENCOUNTER — Encounter: Payer: Self-pay | Admitting: Internal Medicine

## 2022-04-29 ENCOUNTER — Other Ambulatory Visit (HOSPITAL_COMMUNITY): Payer: Medicaid Other

## 2022-04-29 ENCOUNTER — Ambulatory Visit (HOSPITAL_COMMUNITY)
Admission: RE | Admit: 2022-04-29 | Discharge: 2022-04-29 | Disposition: A | Discharge status: Home / Self Care | Payer: No Typology Code available for payment source | Attending: Internal Medicine | Admitting: Internal Medicine

## 2022-04-29 ENCOUNTER — Ambulatory Visit (HOSPITAL_COMMUNITY): Payer: No Typology Code available for payment source | Admitting: Anesthesiology

## 2022-04-29 ENCOUNTER — Encounter (HOSPITAL_COMMUNITY)
Admission: RE | Disposition: A | Discharge status: Home / Self Care | Payer: Self-pay | Source: Home / Self Care | Attending: Internal Medicine

## 2022-04-29 ENCOUNTER — Ambulatory Visit (HOSPITAL_BASED_OUTPATIENT_CLINIC_OR_DEPARTMENT_OTHER): Payer: No Typology Code available for payment source | Admitting: Anesthesiology

## 2022-04-29 ENCOUNTER — Encounter (HOSPITAL_COMMUNITY): Payer: Self-pay

## 2022-04-29 ENCOUNTER — Other Ambulatory Visit: Payer: Self-pay

## 2022-04-29 DIAGNOSIS — K2100 Gastro-esophageal reflux disease with esophagitis, without bleeding: Secondary | ICD-10-CM | POA: Diagnosis not present

## 2022-04-29 DIAGNOSIS — K449 Diaphragmatic hernia without obstruction or gangrene: Secondary | ICD-10-CM

## 2022-04-29 DIAGNOSIS — Z87891 Personal history of nicotine dependence: Secondary | ICD-10-CM | POA: Insufficient documentation

## 2022-04-29 DIAGNOSIS — I10 Essential (primary) hypertension: Secondary | ICD-10-CM | POA: Diagnosis not present

## 2022-04-29 DIAGNOSIS — K2970 Gastritis, unspecified, without bleeding: Secondary | ICD-10-CM

## 2022-04-29 DIAGNOSIS — K317 Polyp of stomach and duodenum: Secondary | ICD-10-CM

## 2022-04-29 DIAGNOSIS — Z6841 Body Mass Index (BMI) 40.0 and over, adult: Secondary | ICD-10-CM | POA: Diagnosis not present

## 2022-04-29 DIAGNOSIS — I517 Cardiomegaly: Secondary | ICD-10-CM

## 2022-04-29 DIAGNOSIS — R1013 Epigastric pain: Secondary | ICD-10-CM

## 2022-04-29 DIAGNOSIS — E785 Hyperlipidemia, unspecified: Secondary | ICD-10-CM

## 2022-04-29 DIAGNOSIS — K6289 Other specified diseases of anus and rectum: Secondary | ICD-10-CM | POA: Diagnosis not present

## 2022-04-29 DIAGNOSIS — K648 Other hemorrhoids: Secondary | ICD-10-CM | POA: Insufficient documentation

## 2022-04-29 DIAGNOSIS — K625 Hemorrhage of anus and rectum: Secondary | ICD-10-CM | POA: Diagnosis not present

## 2022-04-29 DIAGNOSIS — E669 Obesity, unspecified: Secondary | ICD-10-CM | POA: Diagnosis not present

## 2022-04-29 DIAGNOSIS — R0681 Apnea, not elsewhere classified: Secondary | ICD-10-CM

## 2022-04-29 DIAGNOSIS — R61 Generalized hyperhidrosis: Secondary | ICD-10-CM

## 2022-04-29 HISTORY — PX: BIOPSY: SHX5522

## 2022-04-29 HISTORY — PX: COLONOSCOPY WITH PROPOFOL: SHX5780

## 2022-04-29 HISTORY — PX: POLYPECTOMY: SHX5525

## 2022-04-29 HISTORY — PX: ESOPHAGOGASTRODUODENOSCOPY (EGD) WITH PROPOFOL: SHX5813

## 2022-04-29 SURGERY — COLONOSCOPY WITH PROPOFOL
Anesthesia: General

## 2022-04-29 MED ORDER — LIDOCAINE 2% (20 MG/ML) 5 ML SYRINGE
INTRAMUSCULAR | Status: DC | PRN
  Administered 2022-04-29: 50 mg via INTRAVENOUS

## 2022-04-29 MED ORDER — PROPOFOL 10 MG/ML IV BOLUS
INTRAVENOUS | Status: DC | PRN
  Administered 2022-04-29: 100 mg via INTRAVENOUS
  Administered 2022-04-29: 30 mg via INTRAVENOUS
  Administered 2022-04-29: 100 mg via INTRAVENOUS
  Administered 2022-04-29 (×2): 30 mg via INTRAVENOUS
  Administered 2022-04-29: 70 mg via INTRAVENOUS
  Administered 2022-04-29: 60 mg via INTRAVENOUS

## 2022-04-29 MED ORDER — PROPOFOL 500 MG/50ML IV EMUL
INTRAVENOUS | Status: DC | PRN
  Administered 2022-04-29: 200 ug/kg/min via INTRAVENOUS

## 2022-04-29 MED ORDER — PANTOPRAZOLE SODIUM 40 MG PO TBEC: 40.0000 mg | DELAYED_RELEASE_TABLET | Freq: Two times a day (BID) | ORAL | 5 refills | Status: DC

## 2022-04-29 MED ORDER — LACTATED RINGERS IV SOLN: INTRAVENOUS | Status: DC

## 2022-04-29 NOTE — Anesthesia Postprocedure Evaluation (Signed)
Anesthesia Post Note  Patient: Miranda Wade  Procedure(s) Performed: COLONOSCOPY WITH PROPOFOL ESOPHAGOGASTRODUODENOSCOPY (EGD) WITH PROPOFOL BIOPSY POLYPECTOMY  Patient location during evaluation: PACU Anesthesia Type: General Level of consciousness: awake and alert Pain management: pain level controlled Vital Signs Assessment: post-procedure vital signs reviewed and stable Respiratory status: spontaneous breathing, nonlabored ventilation, respiratory function stable and patient connected to nasal cannula oxygen Cardiovascular status: blood pressure returned to baseline and stable Postop Assessment: no apparent nausea or vomiting Anesthetic complications: no   There were no known notable events for this encounter.   Last Vitals:  Vitals:   04/29/22 1212 04/29/22 1309  BP: (!) 143/82 123/69  Pulse: 72 88  Resp: (!) 24 20  Temp: 36.8 C 36.6 C  SpO2: 100% 97%    Last Pain:  Vitals:   04/29/22 1309  TempSrc: Oral  PainSc: 0-No pain                 Trixie Rude

## 2022-04-29 NOTE — Op Note (Signed)
[facility] Patient Name: Miranda Wade Procedure Date: 04/29/2022 12:29 PM MRN: [id number] Date of Birth: 12-21-1980 Attending MD: Elon Alas. Abbey Chatters DO CSN: [id number] Age: 41 Admit Type: Outpatient Procedure:                Colonoscopy Indications:              Rectal pain Providers:                Elon Alas. Abbey Chatters, DO, Janeece Riggers, RN, Everardo Pacific Referring MD:              Medicines:                See the Anesthesia note for documentation of the                            administered medications Complications:            No immediate complications. Estimated Blood Loss:     Estimated blood loss: none. Procedure:                Pre-Anesthesia Assessment:                           - The anesthesia plan was to use monitored                            anesthesia care (MAC).                           After obtaining informed consent, the colonoscope                            was passed under direct vision. Throughout the                            procedure, the patient's blood pressure, pulse, and                            oxygen saturations were monitored continuously. The                            [id number]-HQ190L ([id number]) scope was introduced through                            the anus and advanced to the the cecum, identified                            by appendiceal orifice and ileocecal valve. The                            colonoscopy was performed without difficulty. The                            patient tolerated the procedure well. The quality  of the bowel preparation was evaluated using the                            BBPS [facility] Bowel Preparation Scale) with scores                            of: Right Colon = 3, Transverse Colon = 3 and Left                            Colon = 3 (entire mucosa seen well with no residual                            staining, small fragments of stool or opaque                             liquid). The total BBPS score equals 9. Scope In: 12:54:15 PM Scope Out: 1:04:47 PM Scope Withdrawal Time: 0 hours 7 minutes 0 seconds  Total Procedure Duration: 0 hours 10 minutes 32 seconds  Findings:      The perianal and digital rectal examinations were normal.      Non-bleeding internal hemorrhoids were found during retroflexion.      The exam was otherwise without abnormality. Impression:               - Non-bleeding internal hemorrhoids.                           - The examination was otherwise normal.                           - No specimens collected. Moderate Sedation:      Per Anesthesia Care Recommendation:           - Patient has a contact number available for                            emergencies. The signs and symptoms of potential                            delayed complications were discussed with the                            patient. Return to normal activities tomorrow.                            Written discharge instructions were provided to the                            patient.                           - Resume previous diet.                           - Continue present medications.                           -  Repeat colonoscopy in 10 years for screening                            purposes.                           - Return to GI clinic in 4 months. Procedure Code(s):        --- Professional ---                           [phone], Colonoscopy, flexible; diagnostic, including                            collection of specimen(s) by brushing or washing,                            when performed (separate procedure) Diagnosis Code(s):        --- Professional ---                           K64.8, Other hemorrhoids                           K62.89, Other specified diseases of anus and rectum CPT copyright 2019 American Medical Association. All rights reserved. The codes documented in this report are preliminary and upon coder review may  be revised  to meet current compliance requirements. Elon Alas. Abbey Chatters, DO [place] Abbey Chatters, DO 04/29/2022 1:07:35 PM This report has been signed electronically. Number of Addenda: 0

## 2022-04-29 NOTE — Anesthesia Preprocedure Evaluation (Signed)
Anesthesia Evaluation    Reviewed: Allergy & Precautions, Patient's Chart, lab work & pertinent test results  Airway Mallampati: II  TM Distance: >3 FB Neck ROM: Full    Dental no notable dental hx.    Pulmonary neg pulmonary ROS, former smoker,    Pulmonary exam normal        Cardiovascular Exercise Tolerance: Good hypertension, negative cardio ROS Normal cardiovascular exam     Neuro/Psych negative neurological ROS     GI/Hepatic negative GI ROS, Neg liver ROS,   Endo/Other  negative endocrine ROS  Renal/GU negative Renal ROS     Musculoskeletal negative musculoskeletal ROS (+)   Abdominal (+) + obese,   Peds  Hematology  (+) Blood dyscrasia, anemia ,   Anesthesia Other Findings   Reproductive/Obstetrics                             Anesthesia Physical Anesthesia Plan  ASA: 2  Anesthesia Plan: General   Post-op Pain Management:    Induction: Intravenous  PONV Risk Score and Plan: 2 and TIVA  Airway Management Planned: Nasal Cannula  Additional Equipment:   Intra-op Plan:   Post-operative Plan:   Informed Consent: I have reviewed the patients History and Physical, chart, labs and discussed the procedure including the risks, benefits and alternatives for the proposed anesthesia with the patient or authorized representative who has indicated his/her understanding and acceptance.       Plan Discussed with: CRNA  Anesthesia Plan Comments:         Anesthesia Quick Evaluation

## 2022-04-29 NOTE — Interval H&P Note (Signed)
History and Physical Interval Note:  04/29/2022 12:07 PM  Miranda Wade  has presented today for surgery, with the diagnosis of EPIGASTRIC PAIN, NAUSEA, RECTAL DISCOMFORT, DIAPHORESIS.  The various methods of treatment have been discussed with the patient and family. After consideration of risks, benefits and other options for treatment, the patient has consented to  Procedure(s) with comments: COLONOSCOPY WITH PROPOFOL (N/A) - 2:00pm ESOPHAGOGASTRODUODENOSCOPY (EGD) WITH PROPOFOL (N/A) as a surgical intervention.  The patient's history has been reviewed, patient examined, no change in status, stable for surgery.  I have reviewed the patient's chart and labs.  Questions were answered to the patient's satisfaction.     Eloise Harman

## 2022-04-29 NOTE — Op Note (Signed)
[facility] Patient Name: Miranda Wade Procedure Date: 04/29/2022 12:28 PM MRN: [id number] Date of Birth: 06-May-1981 Attending MD: Elon Alas. Abbey Chatters DO CSN: [id number] Age: 41 Admit Type: Outpatient Procedure:                Upper GI endoscopy Indications:              Epigastric abdominal pain, Heartburn Providers:                Elon Alas. Abbey Chatters, DO, Janeece Riggers, RN, Everardo Pacific Referring MD:              Medicines:                See the Anesthesia note for documentation of the                            administered medications Complications:            No immediate complications. Estimated Blood Loss:     Estimated blood loss was minimal. Procedure:                Pre-Anesthesia Assessment:                           - The anesthesia plan was to use monitored                            anesthesia care (MAC).                           After obtaining informed consent, the endoscope was                            passed under direct vision. Throughout the                            procedure, the patient's blood pressure, pulse, and                            oxygen saturations were monitored continuously. The                            GIF-H190 ([id number]) scope was introduced through the                            mouth, and advanced to the second part of duodenum.                            The upper GI endoscopy was accomplished without                            difficulty. The patient tolerated the procedure                            well. Scope In: 12:37:21 PM  Scope Out: 12:49:20 PM Total Procedure Duration: 0 hours 11 minutes 59 seconds  Findings:      A 2 cm hiatal hernia was present.      LA Grade A (one or more mucosal breaks less than 5 mm, not extending       between tops of 2 mucosal folds) esophagitis with no bleeding was found       at the gastroesophageal junction.      Patchy mild inflammation characterized by erythema  was found in the       gastric body. Biopsies were taken with a cold forceps for Helicobacter       pylori testing.      A single 5 mm sessile polyp with no bleeding was found in the second       portion of the duodenum. The polyp was removed with a hot snare.       Resection and retrieval were complete.      The ampulla was normal. Impression:               - 2 cm hiatal hernia.                           - LA Grade A reflux esophagitis with no bleeding.                           - Gastritis. Biopsied.                           - A single duodenal polyp. Resected and retrieved.                           - Normal ampulla. Moderate Sedation:      Per Anesthesia Care Recommendation:           - Patient has a contact number available for                            emergencies. The signs and symptoms of potential                            delayed complications were discussed with the                            patient. Return to normal activities tomorrow.                            Written discharge instructions were provided to the                            patient.                           - Resume previous diet.                           - Continue present medications.                           - Await pathology results.                           -  Use Protonix (pantoprazole) 40 mg PO BID for 12                            weeks.                           - Return to GI clinic in 4 months. Procedure Code(s):        --- Professional ---                           [phone], Esophagogastroduodenoscopy, flexible,                            transoral; with removal of tumor(s), polyp(s), or                            other lesion(s) by snare technique                           43239, 14, Esophagogastroduodenoscopy, flexible,                            transoral; with biopsy, single or multiple Diagnosis Code(s):        --- Professional ---                           K44.9, Diaphragmatic hernia  without obstruction or                            gangrene                           K21.00, Gastro-esophageal reflux disease with                            esophagitis, without bleeding                           K29.70, Gastritis, unspecified, without bleeding                           K31.7, Polyp of stomach and duodenum                           R10.13, Epigastric pain                           R12, Heartburn CPT copyright 2019 [employer]. All rights reserved. The codes documented in this report are preliminary and upon coder review may  be revised to meet current compliance requirements. Elon Alas. Abbey Chatters, DO [place] Abbey Chatters, DO 04/29/2022 12:52:39 PM This report has been signed electronically. Number of Addenda: 0

## 2022-04-29 NOTE — Discharge Instructions (Signed)
EGD Discharge instructions Please read the instructions outlined below and refer to this sheet in the next few weeks. These discharge instructions provide you with general information on caring for yourself after you leave the hospital. Your doctor may also give you specific instructions. While your treatment has been planned according to the most current medical practices available, unavoidable complications occasionally occur. If you have any problems or questions after discharge, please call your doctor. ACTIVITY You may resume your regular activity but move at a slower pace for the next 24 hours.  Take frequent rest periods for the next 24 hours.  Walking will help expel (get rid of) the air and reduce the bloated feeling in your abdomen.  No driving for 24 hours (because of the anesthesia (medicine) used during the test).  You may shower.  Do not sign any important legal documents or operate any machinery for 24 hours (because of the anesthesia used during the test).  NUTRITION Drink plenty of fluids.  You may resume your normal diet.  Begin with a light meal and progress to your normal diet.  Avoid alcoholic beverages for 24 hours or as instructed by your caregiver.  MEDICATIONS You may resume your normal medications unless your caregiver tells you otherwise.  WHAT YOU CAN EXPECT TODAY You may experience abdominal discomfort such as a feeling of fullness or "gas" pains.  FOLLOW-UP Your doctor will discuss the results of your test with you.  SEEK IMMEDIATE MEDICAL ATTENTION IF ANY OF THE FOLLOWING OCCUR: Excessive nausea (feeling sick to your stomach) and/or vomiting.  Severe abdominal pain and distention (swelling).  Trouble swallowing.  Temperature over 101 F (37.8 C).  Rectal bleeding or vomiting of blood.    Colonoscopy Discharge Instructions  Read the instructions outlined below and refer to this sheet in the next few weeks. These discharge instructions provide you with  general information on caring for yourself after you leave the hospital. Your doctor may also give you specific instructions. While your treatment has been planned according to the most current medical practices available, unavoidable complications occasionally occur.   ACTIVITY You may resume your regular activity, but move at a slower pace for the next 24 hours.  Take frequent rest periods for the next 24 hours.  Walking will help get rid of the air and reduce the bloated feeling in your belly (abdomen).  No driving for 24 hours (because of the medicine (anesthesia) used during the test).   Do not sign any important legal documents or operate any machinery for 24 hours (because of the anesthesia used during the test).  NUTRITION Drink plenty of fluids.  You may resume your normal diet as instructed by your doctor.  Begin with a light meal and progress to your normal diet. Heavy or fried foods are harder to digest and may make you feel sick to your stomach (nauseated).  Avoid alcoholic beverages for 24 hours or as instructed.  MEDICATIONS You may resume your normal medications unless your doctor tells you otherwise.  WHAT YOU CAN EXPECT TODAY Some feelings of bloating in the abdomen.  Passage of more gas than usual.  Spotting of blood in your stool or on the toilet paper.  IF YOU HAD POLYPS REMOVED DURING THE COLONOSCOPY: No aspirin products for 7 days or as instructed.  No alcohol for 7 days or as instructed.  Eat a soft diet for the next 24 hours.  FINDING OUT THE RESULTS OF YOUR TEST Not all test results are available  during your visit. If your test results are not back during the visit, make an appointment with your caregiver to find out the results. Do not assume everything is normal if you have not heard from your caregiver or the medical facility. It is important for you to follow up on all of your test results.  SEEK IMMEDIATE MEDICAL ATTENTION IF: You have more than a spotting of  blood in your stool.  Your belly is swollen (abdominal distention).  You are nauseated or vomiting.  You have a temperature over 101.  You have abdominal pain or discomfort that is severe or gets worse throughout the day.   Your EGD revealed mild amount inflammation in your stomach.  I took biopsies of this to rule out infection with a bacteria called H. pylori.  Await pathology results, my office will contact you.  He also had inflammation of your esophagus indicative of acid reflux and a small hiatal hernia.  I am going to increase your pantoprazole to 40 mg twice daily for the next 12 weeks.  Prescription sent to your pharmacy.  You had 1 polyp in your small bowel which I also removed.  Your colon looked very healthy.  I did not see any inflammation indicative of underlying inflammatory bowel disease such as Crohn's disease or ulcerative colitis.  I did not find any polyps or evidence of cancer.  You do have internal hemorrhoids.  Recommend repeat colonoscopy in 10 years for screening purposes.  Follow up with GI in 3-4 months.    I hope you have a great rest of your week!  Elon Alas. Abbey Chatters, D.O. Gastroenterology and Hepatology [facility]

## 2022-04-29 NOTE — Transfer of Care (Addendum)
Immediate Anesthesia Transfer of Care Note  Patient: Miranda Wade  Procedure(s) Performed: COLONOSCOPY WITH PROPOFOL ESOPHAGOGASTRODUODENOSCOPY (EGD) WITH PROPOFOL BIOPSY POLYPECTOMY  Patient Location: Short Stay  Anesthesia Type:General  Level of Consciousness: drowsy  Airway & Oxygen Therapy: Patient Spontanous Breathing  Post-op Assessment: Report given to RN and Post -op Vital signs reviewed and stable  Post vital signs: Reviewed and stable  Last Vitals:  Vitals Value Taken Time  BP    Temp    Pulse    Resp    SpO2      Last Pain:  Vitals:   04/29/22 1212  TempSrc: Oral  PainSc: 0-No pain      Patients Stated Pain Goal: 7 (78/97/84 7841)  Complications: No notable events documented.

## 2022-04-30 ENCOUNTER — Encounter (HOSPITAL_COMMUNITY)
Admission: RE | Admit: 2022-04-30 | Discharge: 2022-04-30 | Disposition: A | Discharge status: Home / Self Care | Payer: No Typology Code available for payment source | Source: Ambulatory Visit | Attending: Obstetrics & Gynecology | Admitting: Obstetrics & Gynecology

## 2022-04-30 LAB — SURGICAL PATHOLOGY

## 2022-05-01 ENCOUNTER — Ambulatory Visit (HOSPITAL_COMMUNITY): Payer: No Typology Code available for payment source

## 2022-05-01 ENCOUNTER — Encounter (HOSPITAL_COMMUNITY): Payer: Self-pay | Admitting: Obstetrics & Gynecology

## 2022-05-01 ENCOUNTER — Ambulatory Visit (HOSPITAL_BASED_OUTPATIENT_CLINIC_OR_DEPARTMENT_OTHER): Payer: No Typology Code available for payment source

## 2022-05-01 ENCOUNTER — Ambulatory Visit (HOSPITAL_COMMUNITY)
Admission: RE | Admit: 2022-05-01 | Discharge: 2022-05-01 | Disposition: A | Discharge status: Home / Self Care | Payer: No Typology Code available for payment source | Attending: Obstetrics & Gynecology | Admitting: Obstetrics & Gynecology

## 2022-05-01 ENCOUNTER — Other Ambulatory Visit: Payer: Self-pay

## 2022-05-01 ENCOUNTER — Encounter (HOSPITAL_COMMUNITY)
Admission: RE | Disposition: A | Discharge status: Home / Self Care | Payer: Self-pay | Source: Home / Self Care | Attending: Obstetrics & Gynecology

## 2022-05-01 DIAGNOSIS — E785 Hyperlipidemia, unspecified: Secondary | ICD-10-CM | POA: Diagnosis not present

## 2022-05-01 DIAGNOSIS — Z87891 Personal history of nicotine dependence: Secondary | ICD-10-CM | POA: Diagnosis not present

## 2022-05-01 DIAGNOSIS — Z302 Encounter for sterilization: Secondary | ICD-10-CM | POA: Insufficient documentation

## 2022-05-01 DIAGNOSIS — Z3046 Encounter for surveillance of implantable subdermal contraceptive: Secondary | ICD-10-CM | POA: Diagnosis not present

## 2022-05-01 DIAGNOSIS — E669 Obesity, unspecified: Secondary | ICD-10-CM | POA: Diagnosis not present

## 2022-05-01 DIAGNOSIS — Z4003 Encounter for prophylactic removal of fallopian tube(s): Secondary | ICD-10-CM | POA: Diagnosis not present

## 2022-05-01 DIAGNOSIS — Z6841 Body Mass Index (BMI) 40.0 and over, adult: Secondary | ICD-10-CM | POA: Diagnosis not present

## 2022-05-01 DIAGNOSIS — I10 Essential (primary) hypertension: Secondary | ICD-10-CM | POA: Insufficient documentation

## 2022-05-01 HISTORY — PX: REMOVAL OF NON VAGINAL CONTRACEPTIVE DEVICE: SHX6219

## 2022-05-01 HISTORY — PX: LAPAROSCOPIC BILATERAL SALPINGECTOMY: SHX5889

## 2022-05-01 SURGERY — SALPINGECTOMY, BILATERAL, LAPAROSCOPIC
Anesthesia: General | Site: Arm Upper

## 2022-05-01 MED ORDER — POVIDONE-IODINE 10 % EX SWAB: 2.0000 | Freq: Once | CUTANEOUS | Status: DC

## 2022-05-01 MED ORDER — FENTANYL CITRATE PF 50 MCG/ML IJ SOSY: 25.0000 ug | PREFILLED_SYRINGE | INTRAMUSCULAR | Status: DC | PRN

## 2022-05-01 MED ORDER — FENTANYL CITRATE (PF) 250 MCG/5ML IJ SOLN
INTRAMUSCULAR | Status: AC
  Filled 2022-05-01: qty 5

## 2022-05-01 MED ORDER — CEFAZOLIN SODIUM-DEXTROSE 2-4 GM/100ML-% IV SOLN
2.0000 g | INTRAVENOUS | Status: AC
  Administered 2022-05-01: 2 g via INTRAVENOUS
  Filled 2022-05-01: qty 100

## 2022-05-01 MED ORDER — LIDOCAINE HCL (CARDIAC) PF 100 MG/5ML IV SOSY
PREFILLED_SYRINGE | INTRAVENOUS | Status: DC | PRN
  Administered 2022-05-01: 60 mg via INTRATRACHEAL

## 2022-05-01 MED ORDER — LIDOCAINE HCL (PF) 2 % IJ SOLN
INTRAMUSCULAR | Status: AC
  Filled 2022-05-01: qty 5

## 2022-05-01 MED ORDER — BUPIVACAINE LIPOSOME 1.3 % IJ SUSP
20.0000 mL | Freq: Once | INTRAMUSCULAR | Status: DC
  Filled 2022-05-01: qty 20

## 2022-05-01 MED ORDER — ONDANSETRON HCL 4 MG/2ML IJ SOLN
INTRAMUSCULAR | Status: DC | PRN
  Administered 2022-05-01: 4 mg via INTRAVENOUS

## 2022-05-01 MED ORDER — PROPOFOL 10 MG/ML IV BOLUS
INTRAVENOUS | Status: AC
  Filled 2022-05-01: qty 20

## 2022-05-01 MED ORDER — DEXAMETHASONE SODIUM PHOSPHATE 10 MG/ML IJ SOLN
INTRAMUSCULAR | Status: DC | PRN
  Administered 2022-05-01: 10 mg via INTRAVENOUS

## 2022-05-01 MED ORDER — LACTATED RINGERS IV SOLN: INTRAVENOUS | Status: DC

## 2022-05-01 MED ORDER — SUGAMMADEX SODIUM 200 MG/2ML IV SOLN
INTRAVENOUS | Status: DC | PRN
  Administered 2022-05-01: 250 mg via INTRAVENOUS

## 2022-05-01 MED ORDER — SUGAMMADEX SODIUM 500 MG/5ML IV SOLN
INTRAVENOUS | Status: AC
  Filled 2022-05-01: qty 5

## 2022-05-01 MED ORDER — ONDANSETRON 8 MG PO TBDP: 8.0000 mg | ORAL_TABLET | Freq: Three times a day (TID) | ORAL | 0 refills | Status: DC | PRN

## 2022-05-01 MED ORDER — KETOROLAC TROMETHAMINE 30 MG/ML IJ SOLN
30.0000 mg | Freq: Once | INTRAMUSCULAR | Status: AC
  Administered 2022-05-01: 30 mg via INTRAVENOUS
  Filled 2022-05-01: qty 1

## 2022-05-01 MED ORDER — CHLORHEXIDINE GLUCONATE 0.12 % MT SOLN
15.0000 mL | Freq: Once | OROMUCOSAL | Status: AC
  Administered 2022-05-01: 15 mL via OROMUCOSAL

## 2022-05-01 MED ORDER — ORAL CARE MOUTH RINSE: 15.0000 mL | Freq: Once | OROMUCOSAL | Status: AC

## 2022-05-01 MED ORDER — GLYCOPYRROLATE PF 0.2 MG/ML IJ SOSY
PREFILLED_SYRINGE | INTRAMUSCULAR | Status: AC
  Filled 2022-05-01: qty 1

## 2022-05-01 MED ORDER — ROCURONIUM BROMIDE 10 MG/ML (PF) SYRINGE
PREFILLED_SYRINGE | INTRAVENOUS | Status: AC
  Filled 2022-05-01: qty 10

## 2022-05-01 MED ORDER — BUPIVACAINE LIPOSOME 1.3 % IJ SUSP
INTRAMUSCULAR | Status: DC | PRN
  Administered 2022-05-01: 20 mL

## 2022-05-01 MED ORDER — DEXAMETHASONE SODIUM PHOSPHATE 10 MG/ML IJ SOLN
INTRAMUSCULAR | Status: AC
  Filled 2022-05-01: qty 1

## 2022-05-01 MED ORDER — HYDROCODONE-ACETAMINOPHEN 5-325 MG PO TABS: 1.0000 | ORAL_TABLET | Freq: Four times a day (QID) | ORAL | 0 refills | Status: DC | PRN

## 2022-05-01 MED ORDER — GLYCOPYRROLATE 0.2 MG/ML IJ SOLN
INTRAMUSCULAR | Status: DC | PRN
  Administered 2022-05-01: .1 mg via INTRAVENOUS

## 2022-05-01 MED ORDER — FENTANYL CITRATE (PF) 100 MCG/2ML IJ SOLN
INTRAMUSCULAR | Status: DC | PRN
  Administered 2022-05-01: 100 ug via INTRAVENOUS
  Administered 2022-05-01: 50 ug via INTRAVENOUS
  Administered 2022-05-01: 100 ug via INTRAVENOUS

## 2022-05-01 MED ORDER — SODIUM CHLORIDE 0.9 % IR SOLN
Status: DC | PRN
  Administered 2022-05-01: 1000 mL

## 2022-05-01 MED ORDER — BUPIVACAINE LIPOSOME 1.3 % IJ SUSP
INTRAMUSCULAR | Status: AC
  Filled 2022-05-01: qty 20

## 2022-05-01 MED ORDER — PROPOFOL 10 MG/ML IV BOLUS
INTRAVENOUS | Status: DC | PRN
  Administered 2022-05-01: 200 mg via INTRAVENOUS

## 2022-05-01 MED ORDER — HYDROCODONE-ACETAMINOPHEN 7.5-325 MG PO TABS: 1.0000 | ORAL_TABLET | Freq: Once | ORAL | Status: DC | PRN

## 2022-05-01 MED ORDER — ROCURONIUM BROMIDE 10 MG/ML (PF) SYRINGE
PREFILLED_SYRINGE | INTRAVENOUS | Status: DC | PRN
  Administered 2022-05-01: 50 mg via INTRAVENOUS

## 2022-05-01 MED ORDER — ONDANSETRON HCL 4 MG/2ML IJ SOLN: 4.0000 mg | Freq: Once | INTRAMUSCULAR | Status: DC | PRN

## 2022-05-01 MED ORDER — MIDAZOLAM HCL 2 MG/2ML IJ SOLN
INTRAMUSCULAR | Status: AC
  Filled 2022-05-01: qty 2

## 2022-05-01 MED ORDER — MIDAZOLAM HCL 2 MG/2ML IJ SOLN
INTRAMUSCULAR | Status: DC | PRN
  Administered 2022-05-01: 2 mg via INTRAVENOUS

## 2022-05-01 MED ORDER — CEFAZOLIN SODIUM-DEXTROSE 2-4 GM/100ML-% IV SOLN
INTRAVENOUS | Status: AC
  Filled 2022-05-01: qty 100

## 2022-05-01 MED ORDER — ONDANSETRON HCL 4 MG/2ML IJ SOLN
INTRAMUSCULAR | Status: AC
  Filled 2022-05-01: qty 2

## 2022-05-01 MED ORDER — KETOROLAC TROMETHAMINE 10 MG PO TABS: 10.0000 mg | ORAL_TABLET | Freq: Three times a day (TID) | ORAL | 0 refills | Status: DC | PRN

## 2022-05-01 SURGICAL SUPPLY — 42 items
ADH SKN CLS APL DERMABOND .7 (GAUZE/BANDAGES/DRESSINGS) ×4
BAG HAMPER (MISCELLANEOUS) ×3 IMPLANT
BLADE SURG SZ11 CARB STEEL (BLADE) ×3 IMPLANT
CLOTH BEACON ORANGE TIMEOUT ST (SAFETY) ×3 IMPLANT
COVER LIGHT HANDLE STERIS (MISCELLANEOUS) ×6 IMPLANT
DERMABOND ADVANCED (GAUZE/BANDAGES/DRESSINGS) ×2
DERMABOND ADVANCED .7 DNX12 (GAUZE/BANDAGES/DRESSINGS) IMPLANT
DRSG TEGADERM 2-3/8X2-3/4 SM (GAUZE/BANDAGES/DRESSINGS) ×1 IMPLANT
ELECT REM PT RETURN 9FT ADLT (ELECTROSURGICAL) ×3
ELECTRODE REM PT RTRN 9FT ADLT (ELECTROSURGICAL) ×2 IMPLANT
GAUZE 4X4 16PLY ~~LOC~~+RFID DBL (SPONGE) ×6 IMPLANT
GLOVE BIO SURGEON STRL SZ7 (GLOVE) ×1 IMPLANT
GLOVE BIOGEL PI IND STRL 7.0 (GLOVE) ×4 IMPLANT
GLOVE BIOGEL PI IND STRL 8 (GLOVE) ×2 IMPLANT
GLOVE BIOGEL PI INDICATOR 7.0 (GLOVE) ×2
GLOVE BIOGEL PI INDICATOR 8 (GLOVE) ×1
GLOVE ECLIPSE 8.0 STRL XLNG CF (GLOVE) ×6 IMPLANT
GOWN STRL REUS W/TWL LRG LVL3 (GOWN DISPOSABLE) ×3 IMPLANT
GOWN STRL REUS W/TWL XL LVL3 (GOWN DISPOSABLE) ×3 IMPLANT
INST SET LAPROSCOPIC GYN AP (KITS) ×3 IMPLANT
KIT TURNOVER CYSTO (KITS) ×3 IMPLANT
NDL HYPO 21X1.5 SAFETY (NEEDLE) ×2 IMPLANT
NDL INSUFFLATION 14GA 120MM (NEEDLE) ×2 IMPLANT
NEEDLE HYPO 21X1.5 SAFETY (NEEDLE) ×3 IMPLANT
NEEDLE INSUFFLATION 14GA 120MM (NEEDLE) ×3 IMPLANT
PACK PERI GYN (CUSTOM PROCEDURE TRAY) ×3 IMPLANT
PAD ARMBOARD 7.5X6 YLW CONV (MISCELLANEOUS) ×3 IMPLANT
SET BASIN LINEN APH (SET/KITS/TRAYS/PACK) ×3 IMPLANT
SET TUBE SMOKE EVAC HIGH FLOW (TUBING) ×3 IMPLANT
SHEARS HARMONIC ACE PLUS 36CM (ENDOMECHANICALS) ×3 IMPLANT
SLEEVE Z-THREAD 5X100MM (TROCAR) ×3 IMPLANT
SOL ANTI FOG 6CC (MISCELLANEOUS) ×2 IMPLANT
SOLUTION ANTI FOG 6CC (MISCELLANEOUS) ×1
SPONGE GAUZE 2X2 8PLY STRL LF (GAUZE/BANDAGES/DRESSINGS) ×1 IMPLANT
SUT ETHILON 3 0 PS 1 (SUTURE) ×1 IMPLANT
SUT VICRYL 0 UR6 27IN ABS (SUTURE) ×3 IMPLANT
SUT VICRYL AB 3-0 FS1 BRD 27IN (SUTURE) ×3 IMPLANT
SYR 10ML LL (SYRINGE) ×3 IMPLANT
SYR 20ML LL LF (SYRINGE) ×5 IMPLANT
TROCAR Z-THRD FIOS HNDL 11X100 (TROCAR) ×3 IMPLANT
TROCAR Z-THREAD FIOS 5X100MM (TROCAR) ×3 IMPLANT
WARMER LAPAROSCOPE (MISCELLANEOUS) ×3 IMPLANT

## 2022-05-01 NOTE — Transfer of Care (Signed)
Immediate Anesthesia Transfer of Care Note  Patient: Miranda Wade  Procedure(s) Performed: LAPAROSCOPIC BILATERAL SALPINGECTOMY (Abdomen) REMOVAL OF NON VAGINAL CONTRACEPTIVE DEVICE (Arm Upper)  Patient Location: PACU  Anesthesia Type:General  Level of Consciousness: awake, alert  and oriented  Airway & Oxygen Therapy: Patient connected to nasal cannula oxygen  Post-op Assessment: Report given to RN and Post -op Vital signs reviewed and stable  Post vital signs: Reviewed and stable  Last Vitals:  Vitals Value Taken Time  BP 150/92 05/01/22 1127  Temp 36.9 C 05/01/22 1127  Pulse 78 05/01/22 1129  Resp 20 05/01/22 1129  SpO2 100 % 05/01/22 1129  Vitals shown include unvalidated device data.  Last Pain:  Vitals:   05/01/22 0856  TempSrc: Oral  PainSc:       Patients Stated Pain Goal: 7 (08/16/24 3664)  Complications: No notable events documented.

## 2022-05-01 NOTE — Op Note (Signed)
Preoperative Diagnosis:  1.  Multiparous female desires permanent sterilization                                          2.  Elects to have bilateral salpingectomy for ovarian cancer prophylaxis                                          3.  Wants nexplanon removed  Postoperative Diagnosis:  Same as above  Procedure:  Laparoscopic Bilateral Salpingectomy for the purpose of permanent sterilization, removal of nexplanon  Surgeon:  Jacelyn Grip MD  Anaesthesia: general  Findings:  Patient had normal pelvic anatomy and no intraperitoneal abnormalities.  Description of Operation:  Patient was taken to the OR and placed into supine position where she underwent general anaesthesia.   She was placed in the dorsal lithotomy position and prepped and draped in the usual sterile fashion.   An incision was made in the umbilicus and dissection taken down to the rectus fascia. A Veres needle was used to insufflate the periotneal cavity. An 11 mm non bladed video laparoscope trocar was then placed under direct visualization without difficulty.   The above noted findings were observed.   Two additional 5 mm non bladed trocars were placed in the right and left lower quadrants under direct visualization without difficulty.   The Harmonic scalpel was employed and a salpingectomy of both the right and left tubes was performed.   The tubes were removed from the peritoneal cavity and sent to pathology.   There was good hemostasis bilaterally.   The fascia, peritoneum and subcutaneous tissue were closed using 0 vicryl.   All 3 skin incisions were closed using 3-0 vicryl in a subcuticular fashion.  Exparel 266 mg 20 cc was injected in the 3 incisional/trocar sites.   The nexplanon device was in the left arm It was located Smal lincision made and removed without difficulty Suture required for hemostasis  The patient was awakened from anaesthesia and taken to the PACU with all counts being correct x 3.    The patient received  2 gram of ancef andToradol 30 mg IV preoperatively.  Florian Buff 05/01/2022 11:32 AM

## 2022-05-01 NOTE — Anesthesia Procedure Notes (Signed)
Procedure Name: Intubation Date/Time: 05/01/2022 10:22 AM  Performed by: Karna Dupes, CRNAPre-anesthesia Checklist: Patient identified, Emergency Drugs available, Suction available and Patient being monitored Patient Re-evaluated:Patient Re-evaluated prior to induction Oxygen Delivery Method: Circle system utilized Preoxygenation: Pre-oxygenation with 100% oxygen Induction Type: IV induction Ventilation: Mask ventilation without difficulty Laryngoscope Size: Mac and 3 Grade View: Grade II Tube type: Oral Tube size: 7.0 mm Number of attempts: 1 Airway Equipment and Method: Stylet Placement Confirmation: ETT inserted through vocal cords under direct vision, positive ETCO2 and breath sounds checked- equal and bilateral Secured at: 22 cm Tube secured with: Tape Dental Injury: Teeth and Oropharynx as per pre-operative assessment

## 2022-05-01 NOTE — H&P (Signed)
Preoperative History and Physical  Miranda Wade is a 41 y.o. [id number] with Patient's last menstrual period was 04/15/2022. admitted for a laparoscopic bilateral salpingectomy, removal of Nexplanon.  Has experienced DUB on nexplanon  PMH:    Past Medical History:  Diagnosis Date   Anemia    Essential hypertension    Hyperlipidemia    Seasonal allergies     PSH:     Past Surgical History:  Procedure Laterality Date   MASS EXCISION Right 08/04/2017   Procedure: EXCISION OF RIGHT NASAL  MASS;  Surgeon: Miranda Baptist, MD;  Location: [place];  Service: ENT;  Laterality: Right;  LOCAL   WRIST SURGERY      POb/GynH:      OB History     Gravida  3   Para  2   Term  2   Preterm      AB  1   Living  2      SAB      IAB      Ectopic      Multiple      Live Births  1           SH:   Social History   Tobacco Use   Smoking status: Former    Packs/day: 0.50    Types: Cigarettes    Passive exposure: Current   Smokeless tobacco: Never  Vaping Use   Vaping Use: Never used  Substance Use Topics   Alcohol use: No   Drug use: No    FH:    Family History  Problem Relation Age of Onset   Diabetes Mother    Hypertension Mother    Arthritis Mother    Hyperlipidemia Mother    Stroke Mother    Hypertension Father    Heart disease Father 39       Valve replacement   Hyperlipidemia Father    Asthma Daughter    Hypertension Brother    Stroke Maternal Grandmother    Allergies Daughter      Allergies: No Known Allergies  Medications:       Current Facility-Administered Medications:    bupivacaine liposome (EXPAREL) 1.3 % injection 266 mg, 20 mL, Infiltration, Once, Miranda Wade, Miranda Clause, MD   ceFAZolin (ANCEF) 2-4 GM/100ML-% IVPB, , , ,    ceFAZolin (ANCEF) IVPB 2g/100 mL premix, 2 g, Intravenous, On Call to OR, Miranda Buff, MD   povidone-iodine 10 % swab 2 Application, 2 Application, Topical, Once, Miranda Buff, MD  Review of Systems:    Review of Systems  Constitutional: Negative for fever, chills, weight loss, malaise/fatigue and diaphoresis.  HENT: Negative for hearing loss, ear pain, nosebleeds, congestion, sore throat, neck pain, tinnitus and ear discharge.   Eyes: Negative for blurred vision, double vision, photophobia, pain, discharge and redness.  Respiratory: Negative for cough, hemoptysis, sputum production, shortness of breath, wheezing and stridor.   Cardiovascular: Negative for chest pain, palpitations, orthopnea, claudication, leg swelling and PND.  Gastrointestinal: Positive for abdominal pain. Negative for heartburn, nausea, vomiting, diarrhea, constipation, blood in stool and melena.  Genitourinary: Negative for dysuria, urgency, frequency, hematuria and flank pain.  Musculoskeletal: Negative for myalgias, back pain, joint pain and falls.  Skin: Negative for itching and rash.  Neurological: Negative for dizziness, tingling, tremors, sensory change, speech change, focal weakness, seizures, loss of consciousness, weakness and headaches.  Endo/Heme/Allergies: Negative for environmental allergies and polydipsia. Does not bruise/bleed easily.  Psychiatric/Behavioral: Negative for depression, suicidal ideas,  hallucinations, memory loss and substance abuse. The patient is not nervous/anxious and does not have insomnia.      PHYSICAL EXAM:  Blood pressure (!) 151/92, pulse 66, temperature 98.2 F (36.8 C), temperature source Oral, resp. rate 15, last menstrual period 04/15/2022, SpO2 100 %.    Vitals reviewed. Constitutional: She is oriented to person, place, and time. She appears well-developed and well-nourished.  HENT:  Head: Normocephalic and atraumatic.  Right Ear: External ear normal.  Left Ear: External ear normal.  Nose: Nose normal.  Mouth/Throat: Oropharynx is clear and moist.  Eyes: Conjunctivae and EOM are normal. Pupils are equal, round, and reactive to light. Right eye exhibits no discharge.  Left eye exhibits no discharge. No scleral icterus.  Neck: Normal range of motion. Neck supple. No tracheal deviation present. No thyromegaly present.  Cardiovascular: Normal rate, regular rhythm, normal heart sounds and intact distal pulses.  Exam reveals no gallop and no friction rub.   No murmur heard. Respiratory: Effort normal and breath sounds normal. No respiratory distress. She has no wheezes. She has no rales. She exhibits no tenderness.  GI: Soft. Bowel sounds are normal. She exhibits no distension and no mass. There is tenderness. There is no rebound and no guarding.  Genitourinary:       Vulva is normal without lesions Vagina is pink moist without discharge Cervix normal in appearance and pap is normal Uterus is normal size, contour, position, consistency, mobility, non-tender Adnexa is negative with normal sized ovaries by sonogram  Musculoskeletal: Normal range of motion. She exhibits no edema and no tenderness.  Neurological: She is alert and oriented to person, place, and time. She has normal reflexes. She displays normal reflexes. No cranial nerve deficit. She exhibits normal muscle tone. Coordination normal.  Skin: Skin is warm and dry. No rash noted. No erythema. No pallor.  Psychiatric: She has a normal mood and affect. Her behavior is normal. Judgment and thought content normal.    Labs: Results for orders placed or performed during the hospital encounter of 04/29/22 (from the past 336 hour(s))  Surgical pathology   Collection Time: 04/29/22 12:38 PM  Result Value Ref Range   SURGICAL PATHOLOGY      SURGICAL PATHOLOGY CASE: [id number] PATIENT: Miranda Wade Surgical Pathology Report     Clinical History: epigastric pain, nausea, rectal discomfort, diaphoresis     FINAL MICROSCOPIC DIAGNOSIS:  A. STOMACH, BIOPSY: Fragments of gastric mucosa with minimal vascular ectasia. H. pylori, intestinal metaplasia, atrophy and dysplasia are  not identified.  B. DUODENUM, POLYPECTOMY: Polypoid nodular hyperplasia of Brunner's glands. Moderate vascular ectasia in the overlying duodenal mucosa. Negative for adenomatous change and malignancy.   GROSS DESCRIPTION:  A.  Received in formalin are tan, soft tissue fragments that are submitted in toto. Number: 3 size: 0.1 to 0.3 cm blocks: 1  B.  Received in formalin is a 0.7 x 0.5 x 0.3 cm pink-tan nodular soft tissue fragment.  The specimen is entirely submitted in 1 block. (KW, 04/29/2022)    Final Diagnosis performed by Unknown Jim, MD.   Electronically signed 04/30/2022 Technical component performed at [facility], [place] [address], [place], [place] [address].  Professional component performed at [employer]. [facility], [place] [address], [place], [place] [address].  Immunohistochemistry Technical component (if applicable) was performed at [facility]. [address] [place], [place], [place] [address].   IMMUNOHISTOCHEMISTRY DISCLAIMER (if applicable): Some of these immunohistochemical stains may have been developed and  the performance characteristics determine by [facility]. Some may not have been cleared or approved by the U.S. Food and Drug Administration. The FDA has determined that such clearance or approval is not necessary. This test is used for clinical purposes. It should not be regarded as investigational or for research. This laboratory is certified under the [place] (CLIA-88) as qualified to perform high complexity clinical laboratory testing.  The controls stai ned appropriately.   Results for orders placed or performed during the hospital encounter of 04/26/22 (from the past 336 hour(s))  Pregnancy, urine POC   Collection Time: 04/26/22  1:20 PM  Result Value Ref Range   Preg Test, Ur NEGATIVE NEGATIVE  Results for orders placed or performed during the  hospital encounter of 04/26/22 (from the past 336 hour(s))  Rapid HIV screen (HIV 1/2 Ab+Ag)   Collection Time: 04/26/22  1:22 PM  Result Value Ref Range   HIV-1 P24 Antigen - HIV24 NON REACTIVE NON REACTIVE   HIV 1/2 Antibodies NON REACTIVE NON REACTIVE   Interpretation (HIV Ag Ab)      A non reactive test result means that HIV 1 or HIV 2 antibodies and HIV 1 p24 antigen were not detected in the specimen.  Urinalysis, Routine w reflex microscopic Urine, Clean Catch   Collection Time: 04/26/22  1:22 PM  Result Value Ref Range   Color, Urine YELLOW YELLOW   APPearance HAZY (A) CLEAR   Specific Gravity, Urine 1.020 1.005 - 1.030   pH 6.0 5.0 - 8.0   Glucose, UA NEGATIVE NEGATIVE mg/dL   Hgb urine dipstick NEGATIVE NEGATIVE   Bilirubin Urine NEGATIVE NEGATIVE   Ketones, ur NEGATIVE NEGATIVE mg/dL   Protein, ur NEGATIVE NEGATIVE mg/dL   Nitrite NEGATIVE NEGATIVE   Leukocytes,Ua TRACE (A) NEGATIVE   WBC, UA 0-5 0 - 5 WBC/hpf   Bacteria, UA RARE (A) NONE SEEN   Squamous Epithelial / LPF 11-20 0 - 5   Mucus PRESENT     EKG: Orders placed or performed in visit on 04/01/22   EKG 12-Lead    Imaging Studies: No results found.    Assessment: Multiparous female desires permanent sterilzation Desires nexplanon removal  Plan: Laparoscopic bilateral salpingectomy for sterilization with removal of nexplanon  Miranda Wade 05/01/2022 9:51 AM

## 2022-05-01 NOTE — Anesthesia Preprocedure Evaluation (Signed)
Anesthesia Evaluation  Patient identified by MRN, date of birth, ID band Patient awake    Reviewed: Allergy & Precautions, NPO status , Patient's Chart, lab work & pertinent test results  Airway Mallampati: II  TM Distance: >3 FB Neck ROM: Full    Dental no notable dental hx.    Pulmonary neg pulmonary ROS, former smoker,    Pulmonary exam normal        Cardiovascular Exercise Tolerance: Good hypertension, negative cardio ROS Normal cardiovascular exam     Neuro/Psych negative neurological ROS     GI/Hepatic negative GI ROS, Neg liver ROS,   Endo/Other  negative endocrine ROS  Renal/GU negative Renal ROS     Musculoskeletal negative musculoskeletal ROS (+)   Abdominal (+) + obese,   Peds  Hematology negative hematology ROS (+) anemia ,   Anesthesia Other Findings   Reproductive/Obstetrics                             Anesthesia Physical  Anesthesia Plan  ASA: 2  Anesthesia Plan: General   Post-op Pain Management:    Induction: Intravenous  PONV Risk Score and Plan: 2  Airway Management Planned: Oral ETT  Additional Equipment:   Intra-op Plan:   Post-operative Plan: Extubation in OR  Informed Consent: I have reviewed the patients History and Physical, chart, labs and discussed the procedure including the risks, benefits and alternatives for the proposed anesthesia with the patient or authorized representative who has indicated his/her understanding and acceptance.     Dental advisory given  Plan Discussed with: CRNA  Anesthesia Plan Comments:         Anesthesia Quick Evaluation

## 2022-05-01 NOTE — Anesthesia Postprocedure Evaluation (Signed)
Anesthesia Post Note  Patient: Miranda Wade  Procedure(s) Performed: LAPAROSCOPIC BILATERAL SALPINGECTOMY (Bilateral: Abdomen) REMOVAL OF NON VAGINAL CONTRACEPTIVE DEVICE (Arm Upper)  Patient location during evaluation: PACU Anesthesia Type: General Level of consciousness: awake and alert Pain management: pain level controlled Vital Signs Assessment: post-procedure vital signs reviewed and stable Respiratory status: spontaneous breathing, nonlabored ventilation, respiratory function stable and patient connected to nasal cannula oxygen Cardiovascular status: blood pressure returned to baseline and stable Postop Assessment: no apparent nausea or vomiting Anesthetic complications: no   There were no known notable events for this encounter.   Last Vitals:  Vitals:   05/01/22 1145 05/01/22 1215  BP: (!) 156/90 (!) 160/88  Pulse: 64 68  Resp: (!) 23 20  Temp:    SpO2: 100% 100%    Last Pain:  Vitals:   05/01/22 1215  TempSrc:   PainSc: 3                  Trixie Rude

## 2022-05-02 ENCOUNTER — Encounter (HOSPITAL_COMMUNITY): Payer: Self-pay | Admitting: Obstetrics & Gynecology

## 2022-05-02 LAB — SURGICAL PATHOLOGY

## 2022-05-03 ENCOUNTER — Encounter (HOSPITAL_COMMUNITY): Payer: Self-pay | Admitting: Internal Medicine

## 2022-05-03 ENCOUNTER — Encounter: Payer: Medicaid Other | Admitting: Obstetrics & Gynecology

## 2022-05-09 ENCOUNTER — Encounter: Payer: Self-pay | Admitting: Obstetrics & Gynecology

## 2022-05-09 ENCOUNTER — Ambulatory Visit (INDEPENDENT_AMBULATORY_CARE_PROVIDER_SITE_OTHER): Payer: No Typology Code available for payment source | Admitting: Obstetrics & Gynecology

## 2022-05-09 VITALS — BP 143/96 | HR 69 | Ht 65.0 in | Wt 236.4 lb

## 2022-05-09 DIAGNOSIS — Z4889 Encounter for other specified surgical aftercare: Secondary | ICD-10-CM

## 2022-05-09 DIAGNOSIS — Z4802 Encounter for removal of sutures: Secondary | ICD-10-CM

## 2022-05-09 NOTE — Progress Notes (Signed)
    PostOp Visit Note  Miranda Wade is a 41 y.o. G25P2012 female who presents for a postoperative visit. She is 1 week postop following a laparoscopic bilateral salpingectomy completed on 7/12   Today she notes a bit of swelling around her belly button, but otherwise doing great. Denies fever or chills.  Tolerating gen diet.  +Flatus, Regular BMs.  Pain is well controlled.  Feeling ready to go back to work as she sits most of her day.  Overall doing well and reports no acute complaints   Review of Systems Pertinent items are noted in HPI.    Objective:  LMP 04/15/2022 Comment: Urine pregnancy negative on 04-26-2022   Physical Examination:  GENERAL ASSESSMENT: well developed and well nourished SKIN: normal color, no lesions CHEST: normal air exchange, respiratory effort normal with no retractions HEART: regular rate and rhythm ABDOMEN: soft, non-distended INCISION: C/D/I- healing appropriately EXTREMITY: left arm with 3 stitches. Incision C/D/I- healing appropriately PSYCH: mood appropriate, normal affect       STITCH REMOVAL Each stitch was grasped and cut with scissors.  The stitches were removed in their entirety without complications.  Steris placed.  Pt tolerated without complications  Assessment:    Postop incision check Suture removal   Plan:   -meeting milestones appropriately -reviewed postop activity -reviewed care of incisions -f/u prn or October for annual  Janyth Pupa, DO Attending [place], [facility] for [employer], [place]

## 2022-05-22 ENCOUNTER — Other Ambulatory Visit (HOSPITAL_COMMUNITY): Payer: Self-pay | Admitting: Family Medicine

## 2022-05-22 DIAGNOSIS — Z1231 Encounter for screening mammogram for malignant neoplasm of breast: Secondary | ICD-10-CM

## 2022-05-31 ENCOUNTER — Institutional Professional Consult (permissible substitution): Payer: No Typology Code available for payment source | Admitting: Pulmonary Disease

## 2022-07-04 ENCOUNTER — Ambulatory Visit (INDEPENDENT_AMBULATORY_CARE_PROVIDER_SITE_OTHER): Payer: No Typology Code available for payment source | Admitting: Family Medicine

## 2022-07-04 VITALS — BP 149/94 | HR 74 | Temp 97.9°F | Ht 65.0 in | Wt 235.0 lb

## 2022-07-04 DIAGNOSIS — R590 Localized enlarged lymph nodes: Secondary | ICD-10-CM

## 2022-07-04 DIAGNOSIS — I10 Essential (primary) hypertension: Secondary | ICD-10-CM | POA: Diagnosis not present

## 2022-07-04 DIAGNOSIS — Z23 Encounter for immunization: Secondary | ICD-10-CM | POA: Diagnosis not present

## 2022-07-04 MED ORDER — HYDROCHLOROTHIAZIDE 25 MG PO TABS: 12.5000 mg | ORAL_TABLET | Freq: Every day | ORAL | 3 refills | Status: DC

## 2022-07-04 MED ORDER — LOSARTAN POTASSIUM 50 MG PO TABS: 50.0000 mg | ORAL_TABLET | Freq: Every day | ORAL | 3 refills | Status: DC

## 2022-07-04 NOTE — Patient Instructions (Signed)
Monitor your BP at home. Goal (minimum) <140/90.  Follow up in 6 months.  If the area continues to persist, please let me know.

## 2022-07-05 DIAGNOSIS — R590 Localized enlarged lymph nodes: Secondary | ICD-10-CM | POA: Insufficient documentation

## 2022-07-05 LAB — CBC WITH DIFFERENTIAL/PLATELET
Basophils Absolute: 0.1 10*3/uL (ref 0.0–0.2)
Basos: 1 %
EOS (ABSOLUTE): 0.4 10*3/uL (ref 0.0–0.4)
Eos: 4 %
Hematocrit: 36.6 % (ref 34.0–46.6)
Hemoglobin: 11.7 g/dL (ref 11.1–15.9)
Immature Grans (Abs): 0 10*3/uL (ref 0.0–0.1)
Immature Granulocytes: 0 %
Lymphocytes Absolute: 2 10*3/uL (ref 0.7–3.1)
Lymphs: 22 %
MCH: 27.1 pg (ref 26.6–33.0)
MCHC: 32 g/dL (ref 31.5–35.7)
MCV: 85 fL (ref 79–97)
Monocytes Absolute: 0.9 10*3/uL (ref 0.1–0.9)
Monocytes: 10 %
Neutrophils Absolute: 5.9 10*3/uL (ref 1.4–7.0)
Neutrophils: 63 %
Platelets: 357 10*3/uL (ref 150–450)
RBC: 4.32 x10E6/uL (ref 3.77–5.28)
RDW: 14 % (ref 11.7–15.4)
WBC: 9.3 10*3/uL (ref 3.4–10.8)

## 2022-07-05 NOTE — Assessment & Plan Note (Signed)
Good control per home readings.  Patient will continue to monitor closely at home.  She will notify me if her blood pressure remains persistently elevated greater than 140/90.  Continue current dosing of hydrochlorothiazide and losartan.  Refilled today.

## 2022-07-05 NOTE — Progress Notes (Signed)
Subjective:  Patient ID: Miranda Wade, female    DOB: July 17, 1981  Age: 41 y.o. MRN: [id number]  CC: Chief Complaint  Patient presents with   3 month follow up     HTN    HPI:  41 year old female presents for follow-up regarding hypertension.  BP is elevated today.  Patient states that her blood pressure has been well controlled at home.  She is compliant with HCTZ and losartan.  Patient reports that she has recently noticed a lump around her right ear.  She states that it is tender.  Denies any recent illness.  No fever.  She is quite nervous and anxious about this.  She also has a lump/bump on the posterior aspect of her neck which she has had for many years.  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   Diaphoresis 04/02/2022   Apnea 04/02/2022   LVH (left ventricular hypertrophy) 07/21/2017   Essential hypertension, benign 04/30/2016   Morbid obesity ([place]) 04/30/2016   Hyperlipidemia 04/30/2016    Social Hx   Social History   Socioeconomic History   Marital status: Married    Spouse name: [name]   Number of children: 2   Years of education: 14   Highest education level: Not on file  Occupational History   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[employer], Nurse, learning disability  Tobacco Use   Smoking status: Former    Packs/day: 0.50    Types: Cigarettes    Passive exposure: Current   Smokeless tobacco: Never  Vaping Use   Vaping Use: Never used  Substance and Sexual Activity   Alcohol use: No   Drug use: No   Sexual activity: Yes    Birth control/protection: Implant  Other Topics Concern   Not on file  Social History Narrative   Lives daughter [name] is in college, home some weekends   Currently separated from husband   Social Determinants of Health   Financial Resource Strain: Low Risk  (03/26/2022)   Overall Financial Resource Strain (CARDIA)    Difficulty of Paying Living Expenses: Not very hard  Food  Insecurity: No Food Insecurity (03/26/2022)   Hunger Vital Sign    Worried About [place] in the Last Year: Never true    Ran Out of Food in the Last Year: Never true  Transportation Needs: No Transportation Needs (03/26/2022)   PRAPARE - Hydrologist (Medical): No    Lack of Transportation (Non-Medical): No  Physical Activity: Insufficiently Active (03/26/2022)   Exercise Vital Sign    Days of Exercise per Week: 3 days    Minutes of Exercise per Session: 30 min  Stress: No Stress Concern Present (03/26/2022)   [place]    Feeling of Stress : Not at all  Social Connections: Moderately Integrated (03/26/2022)   Social Connection and Isolation Panel [NHANES]    Frequency of Communication with Friends and Family: More than three times a week    Frequency of Social Gatherings with Friends and Family: More than three times a week    Attends Religious Services: 1 to 4 times per year    Active Member of [employer] or Organizations: No    Attends Archivist Meetings: Never    Marital Status: Married    Review of Systems Per HPI  Objective:  BP (!) 149/94   Pulse 74  Temp 97.9 F (36.6 C)   Ht '5\' 5"'$  (1.651 m)   Wt 235 lb (106.6 kg)   SpO2 100%   BMI 39.11 kg/m      07/04/2022    2:16 PM 07/04/2022    1:47 PM 05/09/2022    8:37 AM  BP/Weight  Systolic BP 580 998 338  Diastolic BP 94 94 96  Wt. (Lbs)  235 236.4  BMI  39.11 kg/m2 39.34 kg/m2    Physical Exam Vitals and nursing note reviewed.  Constitutional:      General: She is not in acute distress.    Appearance: Normal appearance.  HENT:     Head: Normocephalic and atraumatic.      Comments: Small, palpable lymph node. Tender to palpation. Eyes:     General:        Right eye: No discharge.        Left eye: No discharge.     Conjunctiva/sclera: Conjunctivae normal.  Cardiovascular:     Rate and Rhythm: Normal  rate and regular rhythm.  Pulmonary:     Effort: Pulmonary effort is normal.     Breath sounds: Normal breath sounds.  Neurological:     Mental Status: She is alert.  Psychiatric:        Mood and Affect: Mood normal.        Behavior: Behavior normal.     Lab Results  Component Value Date   WBC 9.3 07/04/2022   HGB 11.7 07/04/2022   HCT 36.6 07/04/2022   PLT 357 07/04/2022   GLUCOSE 99 04/01/2022   CHOL 173 04/01/2022   TRIG 67 04/01/2022   HDL 39 (L) 04/01/2022   LDLCALC 121 (H) 04/01/2022   ALT 13 04/01/2022   AST 10 04/01/2022   NA 143 04/01/2022   K 3.9 04/01/2022   CL 104 04/01/2022   CREATININE 0.68 04/01/2022   BUN 10 04/01/2022   CO2 23 04/01/2022   TSH 1.31 04/30/2016   HGBA1C 5.6 04/01/2022     Assessment & Plan:   Problem List Items Addressed This Visit       Cardiovascular and Mediastinum   Essential hypertension, benign - Primary    Good control per home readings.  Patient will continue to monitor closely at home.  She will notify me if her blood pressure remains persistently elevated greater than 140/90.  Continue current dosing of hydrochlorothiazide and losartan.  Refilled today.      Relevant Medications   hydrochlorothiazide (HYDRODIURIL) 25 MG tablet   losartan (COZAAR) 50 MG tablet     Immune and Lymphatic   Preauricular lymphadenopathy    Likely reactive/benign.  CBC was normal.  We will continue to monitor closely.  If continues to persist will pursue further work-up/intervention.      Relevant Orders   CBC with Differential (Completed)   Other Visit Diagnoses     Immunization due       Relevant Orders   Flu Vaccine QUAD 31moIM (Fluarix, Fluzone & Alfiuria Quad PF) (Completed)      Meds ordered this encounter  Medications   hydrochlorothiazide (HYDRODIURIL) 25 MG tablet    Sig: Take 0.5 tablets (12.5 mg total) by mouth daily.    Dispense:  45 tablet    Refill:  3   losartan (COZAAR) 50 MG tablet    Sig: Take 1 tablet (50 mg  total) by mouth daily.    Dispense:  90 tablet    Refill:  3  Follow-up:  Return in about 6 months (around 01/02/2023).  [place]

## 2022-07-05 NOTE — Assessment & Plan Note (Signed)
Likely reactive/benign.  CBC was normal.  We will continue to monitor closely.  If continues to persist will pursue further work-up/intervention.

## 2022-07-25 ENCOUNTER — Other Ambulatory Visit: Payer: Self-pay | Admitting: Family Medicine

## 2022-08-01 ENCOUNTER — Ambulatory Visit (HOSPITAL_COMMUNITY)
Admission: RE | Admit: 2022-08-01 | Discharge: 2022-08-01 | Disposition: A | Discharge status: Home / Self Care | Payer: No Typology Code available for payment source | Source: Ambulatory Visit | Attending: Family Medicine | Admitting: Family Medicine

## 2022-08-01 DIAGNOSIS — Z1231 Encounter for screening mammogram for malignant neoplasm of breast: Secondary | ICD-10-CM

## 2022-08-05 ENCOUNTER — Ambulatory Visit: Payer: No Typology Code available for payment source | Admitting: Gastroenterology

## 2022-08-14 ENCOUNTER — Ambulatory Visit: Payer: No Typology Code available for payment source | Admitting: Obstetrics & Gynecology

## 2022-08-26 ENCOUNTER — Other Ambulatory Visit: Payer: Self-pay | Admitting: Family Medicine

## 2022-09-03 ENCOUNTER — Ambulatory Visit (INDEPENDENT_AMBULATORY_CARE_PROVIDER_SITE_OTHER): Payer: No Typology Code available for payment source | Admitting: Obstetrics & Gynecology

## 2022-09-03 ENCOUNTER — Other Ambulatory Visit (HOSPITAL_COMMUNITY)
Admission: RE | Admit: 2022-09-03 | Discharge: 2022-09-03 | Disposition: A | Discharge status: Home / Self Care | Payer: No Typology Code available for payment source | Source: Ambulatory Visit | Attending: Obstetrics & Gynecology | Admitting: Obstetrics & Gynecology

## 2022-09-03 ENCOUNTER — Encounter: Payer: Self-pay | Admitting: Obstetrics & Gynecology

## 2022-09-03 VITALS — BP 136/84 | HR 82 | Ht 65.0 in | Wt 225.4 lb

## 2022-09-03 DIAGNOSIS — Z01419 Encounter for gynecological examination (general) (routine) without abnormal findings: Secondary | ICD-10-CM | POA: Diagnosis not present

## 2022-09-03 DIAGNOSIS — N898 Other specified noninflammatory disorders of vagina: Secondary | ICD-10-CM | POA: Insufficient documentation

## 2022-09-03 DIAGNOSIS — Z113 Encounter for screening for infections with a predominantly sexual mode of transmission: Secondary | ICD-10-CM | POA: Diagnosis present

## 2022-09-03 NOTE — Progress Notes (Signed)
WELL-WOMAN EXAMINATION Patient name: Miranda Wade MRN [id number]  Date of birth: 02-01-1981 Chief Complaint:   Routine Prenatal Visit (Family Planning; has a creamy like discharge after period; started after tubal)  History of Present Illness:   Miranda Wade is a 41 y.o. [phone]  female being seen today for a routine well-woman exam.   Menses are regular every month- about 7 days of bleeding followed by 4-5 days of a discharge.  The  discharge is like "brown goo" Denies odor or itching.  Denies pelvic pain.  No other acute complaints.   Patient's last menstrual period was 08/12/2022 (approximate). Denies issues with her menses The current method of family planning is tubal ligation.    Last pap 06/2021.  Last mammogram: 07/2022. Last colonoscopy: n/a     09/03/2022    2:12 PM 04/01/2022    2:23 PM 03/26/2022   10:23 AM 05/27/2018    1:29 PM 01/12/2018    7:59 AM  Depression screen PHQ 2/9  Decreased Interest 0 0 0 0   Down, Depressed, Hopeless 0 0 0 0 0  PHQ - 2 Score 0 0 0 0 0  Altered sleeping 0  0    Tired, decreased energy 0  0    Change in appetite 0  0    Feeling bad or failure about yourself  0  0    Trouble concentrating 0  0    Moving slowly or fidgety/restless 0  0    Suicidal thoughts 0  0    PHQ-9 Score 0  0        Review of Systems:   Pertinent items are noted in HPI Denies any headaches, blurred vision, fatigue, shortness of breath, chest pain, abdominal pain, bowel movements, urination, or intercourse unless otherwise stated above.  Pertinent History Reviewed:  Reviewed past medical,surgical, social and family history.  Reviewed problem list, medications and allergies. Physical Assessment:   Vitals:   09/03/22 1403  BP: 136/84  Pulse: 82  Weight: 225 lb 6.4 oz (102.2 kg)  Height: '5\' 5"'$  (1.651 m)  Body mass index is 37.51 kg/m.        Physical Examination:   General appearance - well appearing, and in no distress  Mental status - alert,  oriented to person, place, and time  Psych:  She has a normal mood and affect  Skin - warm and dry, normal color, no suspicious lesions noted  Chest - effort normal, all lung fields clear to auscultation bilaterally  Heart - normal rate and regular rhythm  Neck:  midline trachea, no thyromegaly or nodules  Breasts - breasts appear normal, no suspicious masses, no skin or nipple changes or  axillary nodes  Abdomen - soft, nontender, nondistended, no masses or organomegaly  Pelvic - VULVA: normal appearing vulva with no masses, tenderness or lesions  VAGINA: normal appearing vagina with normal color and discharge, no lesions  CERVIX: normal appearing cervix without discharge or lesions, no CMT  UTERUS: uterus is felt to be normal size, shape, consistency and nontender   ADNEXA: No adnexal masses or tenderness noted.  Extremities:  No swelling or varicosities noted  Chaperone:  pt declined      Assessment & Plan:  1) Well-Woman Exam Pap up to date, reviewed screening guidelines Mammogram up to date STI screening to be completed  2) Vaginal discharge -suspect this discharge is the end of her menses and her "new normal" s/p BTL -plan to r/o underlying infection  Meds: No orders of the defined types were placed in this encounter.   Follow-up: Return in about 1 year (around 09/04/2023) for Annual.   Janyth Pupa, DO Attending [place], [place] for [facility], [place]

## 2022-09-04 LAB — RPR: RPR Ser Ql: NONREACTIVE

## 2022-09-04 LAB — HIV ANTIBODY (ROUTINE TESTING W REFLEX): HIV Screen 4th Generation wRfx: NONREACTIVE

## 2022-09-05 LAB — CERVICOVAGINAL ANCILLARY ONLY
Bacterial Vaginitis (gardnerella): NEGATIVE
Candida Glabrata: NEGATIVE
Candida Vaginitis: NEGATIVE
Chlamydia: NEGATIVE
Comment: NEGATIVE
Comment: NEGATIVE
Comment: NEGATIVE
Comment: NEGATIVE
Comment: NEGATIVE
Comment: NORMAL
Neisseria Gonorrhea: NEGATIVE
Trichomonas: NEGATIVE

## 2022-10-08 ENCOUNTER — Encounter: Payer: Self-pay | Admitting: Emergency Medicine

## 2022-10-08 ENCOUNTER — Ambulatory Visit
Admission: EM | Admit: 2022-10-08 | Discharge: 2022-10-08 | Disposition: A | Discharge status: Home / Self Care | Payer: No Typology Code available for payment source | Attending: Nurse Practitioner | Admitting: Nurse Practitioner

## 2022-10-08 DIAGNOSIS — L299 Pruritus, unspecified: Secondary | ICD-10-CM | POA: Diagnosis not present

## 2022-10-08 MED ORDER — FLUTICASONE PROPIONATE 50 MCG/ACT NA SUSP: 2.0000 | Freq: Every day | NASAL | 0 refills | Status: AC

## 2022-10-08 NOTE — Discharge Instructions (Signed)
Patient declined AVS 

## 2022-10-08 NOTE — ED Triage Notes (Signed)
Right ear pain itchy and burning since Saturday.  States it feels clogged up at times.

## 2022-10-08 NOTE — ED Provider Notes (Signed)
[facility]    CSN: [id number] Arrival date & time: 10/08/22  5329      History   Chief Complaint No chief complaint on file.   HPI Miranda Wade is a 41 y.o. female.   The history is provided by the patient.   The patient presents for complaints of itching in the right ear and the right ear feeling clogged over the last several days.  Patient denies ear drainage, fever, cough, nasal congestion, runny nose, or ear drainage.  Patient denies history of cerumen impaction.  Patient reports that she attempted to use peroxide in the ear, which helped temporarily.  She states that the itching becomes more frequent when she is eating.  Past Medical History:  Diagnosis Date   Anemia    Essential hypertension    Hyperlipidemia    Seasonal allergies     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   Diaphoresis 04/02/2022   Apnea 04/02/2022   LVH (left ventricular hypertrophy) 07/21/2017   Essential hypertension, benign 04/30/2016   Morbid obesity ([place]) 04/30/2016   Hyperlipidemia 04/30/2016    Past Surgical History:  Procedure Laterality Date   BIOPSY  04/29/2022   Procedure: BIOPSY;  Surgeon: Eloise Harman, DO;  [facility];  Service: Endoscopy;;   COLONOSCOPY WITH PROPOFOL N/A 04/29/2022   Procedure: COLONOSCOPY WITH PROPOFOL;  Surgeon: Eloise Harman, DO;  [facility];  Service: Endoscopy;  Laterality: N/A;  2:00pm   ESOPHAGOGASTRODUODENOSCOPY (EGD) WITH PROPOFOL N/A 04/29/2022   Procedure: ESOPHAGOGASTRODUODENOSCOPY (EGD) WITH PROPOFOL;  Surgeon: Eloise Harman, DO;  [facility];  Service: Endoscopy;  Laterality: N/A;   LAPAROSCOPIC BILATERAL SALPINGECTOMY Bilateral 05/01/2022   Procedure: LAPAROSCOPIC BILATERAL SALPINGECTOMY;  Surgeon: Florian Buff, MD;  Location: [facility];  Service: Gynecology;  Laterality: Bilateral;   MASS EXCISION Right 08/04/2017   Procedure:  EXCISION OF RIGHT NASAL  MASS;  Surgeon: Leta Baptist, MD;  Location: [facility];  Service: ENT;  Laterality: Right;  LOCAL   POLYPECTOMY  04/29/2022   Procedure: POLYPECTOMY;  Surgeon: Eloise Harman, DO;  [facility];  Service: Endoscopy;;   REMOVAL OF NON VAGINAL CONTRACEPTIVE DEVICE N/A 05/01/2022   Procedure: REMOVAL OF NON VAGINAL CONTRACEPTIVE DEVICE;  Surgeon: Florian Buff, MD;  Location[facility];  Service: Gynecology;  Laterality: N/A;   WRIST SURGERY      OB History     Gravida  3   Para  2   Term  2   Preterm      AB  1   Living  2      SAB      IAB      Ectopic      Multiple      Live Births  1            Home Medications    Prior to Admission medications   Medication Sig Start Date End Date Taking? Authorizing Provider  fluticasone (FLONASE) 50 MCG/ACT nasal spray Place 2 sprays into both nostrils daily. 10/08/22  Yes Sydney Azure-Warren, Alda Lea, NP  Ascorbic Acid (VITAMIN C PO) Take 1 tablet by mouth daily.    [provider]  atorvastatin (LIPITOR) 80 MG tablet Take 1 tablet by mouth once daily 07/25/22   Thersa Salt G, DO  Coenzyme Q10 (CO Q-10 PO) Take 1 capsule by mouth daily.    [provider]  ferrous sulfate 325 (65 FE) MG tablet Take 325 mg by mouth daily with breakfast.    [provider]  hydrochlorothiazide (HYDRODIURIL) 25 MG tablet Take 0.5 tablets (12.5 mg total) by mouth daily. 07/04/22   Coral Spikes, DO  ibuprofen (ADVIL) 600 MG tablet Take 600 mg by mouth every 6 (six) hours as needed for moderate pain.    [provider]  losartan (COZAAR) 50 MG tablet Take 1 tablet (50 mg total) by mouth daily. 07/04/22   Coral Spikes, DO  Omega-3 Fatty Acids (FISH OIL PO) Take 1 capsule by mouth daily.    [provider]  pantoprazole (PROTONIX) 40 MG tablet Take 1 tablet by mouth once daily 08/27/22   Coral Spikes, DO    Family History Family History  Problem Relation Age of  Onset   Diabetes Mother    Hypertension Mother    Arthritis Mother    Hyperlipidemia Mother    Stroke Mother    Hypertension Father    Heart disease Father 14       Valve replacement   Hyperlipidemia Father    Asthma Daughter    Hypertension Brother    Stroke Maternal Grandmother    Allergies Daughter     Social History Social History   Tobacco Use   Smoking status: Former    Packs/day: 0.50    Types: Cigarettes    Passive exposure: Current   Smokeless tobacco: Never  Vaping Use   Vaping Use: Never used  Substance Use Topics   Alcohol use: No   Drug use: No     Allergies   Patient has no known allergies.   Review of Systems Review of Systems Per HPI  Physical Exam Triage Vital Signs ED Triage Vitals  Enc Vitals Group     BP 10/08/22 0858 (!) 152/97     Pulse Rate 10/08/22 0858 70     Resp 10/08/22 0858 18     Temp 10/08/22 0858 98.3 F (36.8 C)     Temp Source 10/08/22 0858 Oral     SpO2 10/08/22 0858 99 %     Weight --      Height --      Head Circumference --      Peak Flow --      Pain Score 10/08/22 0859 6     Pain Loc --      Pain Edu? --      Excl. in [place]? --    No data found.  Updated Vital Signs BP (!) 152/97 (BP Location: Right Arm)   Pulse 70   Temp 98.3 F (36.8 C) (Oral)   Resp 18   LMP 10/02/2022 (Exact Date)   SpO2 99%   Visual Acuity Right Eye Distance:   Left Eye Distance:   Bilateral Distance:    Right Eye Near:   Left Eye Near:    Bilateral Near:     Physical Exam Vitals and nursing note reviewed.  Constitutional:      General: She is not in acute distress.    Appearance: Normal appearance.  HENT:     Head: Normocephalic.     Right Ear: Tympanic membrane, ear canal and external ear normal.     Left Ear: Tympanic membrane, ear canal and external ear normal.     Nose: Nose normal.     Mouth/Throat:     Mouth: Mucous membranes are moist.  Eyes:     Extraocular Movements: Extraocular movements  intact.      Conjunctiva/sclera: Conjunctivae normal.     Pupils: Pupils are equal, round, and reactive to light.  Cardiovascular:     Rate and Rhythm: Normal rate and regular rhythm.     Pulses: Normal pulses.     Heart sounds: Normal heart sounds.  Pulmonary:     Effort: Pulmonary effort is normal.     Breath sounds: Normal breath sounds. No wheezing.  Abdominal:     General: Bowel sounds are normal.     Palpations: Abdomen is soft.  Musculoskeletal:     Cervical back: Normal range of motion.  Skin:    General: Skin is warm and dry.  Neurological:     General: No focal deficit present.     Mental Status: She is alert and oriented to person, place, and time.  Psychiatric:        Mood and Affect: Mood normal.        Behavior: Behavior normal.      UC Treatments / Results  Labs (all labs ordered are listed, but only abnormal results are displayed) Labs Reviewed - No data to display  EKG   Radiology No results found.  Procedures Procedures (including critical care time)  Medications Ordered in UC Medications - No data to display  Initial Impression / Assessment and Plan / UC Course  I have reviewed the triage vital signs and the nursing notes.  Pertinent labs & imaging results that were available during my care of the patient were reviewed by me and considered in my medical decision making (see chart for details).  The patient is well-appearing, she is in no acute distress, vital signs are stable.  Symptoms most likely related to allergic rhinitis.  There is no bulging, erythema, or drainage noted from the right tympanic membrane.  Recommended over-the-counter Benadryl 25 mg at bedtime to help with itching.  Will send prescription for fluticasone 50 mcg nasal spray if symptoms worsen.  Supportive care recommendations were provided to the patient to include avoiding sticking anything inside of the ear, recommending use of tea tree oil if itching worsens, and continuing to use  peroxide as needed for ear irrigation or wax buildup.  Patient verbalizes understanding.  All questions were answered.  Patient is stable for discharge.   Final Clinical Impressions(s) / UC Diagnoses   Final diagnoses:  Itching of ear     Discharge Instructions      Patient declined AVS.     ED Prescriptions     Medication Sig Dispense Auth. Provider   fluticasone (FLONASE) 50 MCG/ACT nasal spray Place 2 sprays into both nostrils daily. 16 g Marbin Olshefski-Warren, Alda Lea, NP      PDMP not reviewed this encounter.   Tish Men, NP 10/08/22 [phone]

## 2022-10-19 ENCOUNTER — Other Ambulatory Visit: Payer: Self-pay | Admitting: Family Medicine

## 2022-10-25 DIAGNOSIS — H5213 Myopia, bilateral: Secondary | ICD-10-CM | POA: Diagnosis not present

## 2022-11-15 DIAGNOSIS — M542 Cervicalgia: Secondary | ICD-10-CM | POA: Diagnosis not present

## 2022-12-31 DIAGNOSIS — H5213 Myopia, bilateral: Secondary | ICD-10-CM | POA: Diagnosis not present

## 2022-12-31 DIAGNOSIS — H52223 Regular astigmatism, bilateral: Secondary | ICD-10-CM | POA: Diagnosis not present

## 2023-01-02 ENCOUNTER — Encounter: Payer: Self-pay | Admitting: Family Medicine

## 2023-01-02 ENCOUNTER — Ambulatory Visit (INDEPENDENT_AMBULATORY_CARE_PROVIDER_SITE_OTHER): Payer: No Typology Code available for payment source | Admitting: Family Medicine

## 2023-01-02 DIAGNOSIS — E782 Mixed hyperlipidemia: Secondary | ICD-10-CM

## 2023-01-02 DIAGNOSIS — Z130 Encounter for screening for diseases of the blood and blood-forming organs and certain disorders involving the immune mechanism: Secondary | ICD-10-CM

## 2023-01-02 DIAGNOSIS — I10 Essential (primary) hypertension: Secondary | ICD-10-CM | POA: Diagnosis not present

## 2023-01-02 DIAGNOSIS — K219 Gastro-esophageal reflux disease without esophagitis: Secondary | ICD-10-CM

## 2023-01-02 MED ORDER — ATORVASTATIN CALCIUM 80 MG PO TABS: 80.0000 mg | ORAL_TABLET | Freq: Every day | ORAL | 5 refills | Status: DC

## 2023-01-02 MED ORDER — HYDROCHLOROTHIAZIDE 25 MG PO TABS: 12.5000 mg | ORAL_TABLET | Freq: Every day | ORAL | 3 refills | Status: DC

## 2023-01-02 MED ORDER — PANTOPRAZOLE SODIUM 40 MG PO TBEC: 40.0000 mg | DELAYED_RELEASE_TABLET | Freq: Every day | ORAL | 5 refills | Status: DC

## 2023-01-02 MED ORDER — LOSARTAN POTASSIUM 100 MG PO TABS: 100.0000 mg | ORAL_TABLET | Freq: Every day | ORAL | 5 refills | Status: DC

## 2023-01-02 NOTE — Assessment & Plan Note (Signed)
Continue Lipitor.  Lipid panel ordered.

## 2023-01-02 NOTE — Assessment & Plan Note (Signed)
Stable on Protonix. Refilled today. 

## 2023-01-02 NOTE — Patient Instructions (Signed)
Labs in 2 weeks.  I increased the Losartan.  Follow up in 6 months.

## 2023-01-02 NOTE — Assessment & Plan Note (Signed)
Not at goal.  Increasing losartan.  Labs ordered.  These are to be obtained in 2 weeks.

## 2023-01-02 NOTE — Progress Notes (Signed)
Subjective:  Patient ID: Miranda Wade, female    DOB: [birth year]  Age: 42 y.o. MRN: [id number]  CC: Chief Complaint  Patient presents with   Hypertension    HPI:  42 year old female with hypertension, LVH, hyperlipidemia, obesity presents for follow-up.  Patient's blood pressure is elevated here.  She states that the diastolic number has been elevated at home.  She is currently on HCTZ 12.5 mg daily and losartan 50 mg daily.  Will discussed dose increase of losartan.  Needs updated lipid panel to assess control with Lipitor.  She is tolerating.  GERD stable on Protonix.  Patient states that she is feeling well.  No chest pain or shortness of breath.  She has no complaints or concerns at this time.  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[employer], Nurse, learning disability  Tobacco Use   Smoking status: Former    Packs/day: .5    Types: Cigarettes    Passive exposure: Current   Smokeless tobacco: Never  Vaping Use   Vaping Use: Never used  Substance and Sexual Activity   Alcohol use: No   Drug use: No   Sexual activity: Yes    Birth control/protection: Surgical    Comment: tubal  Other Topics Concern   Not on file  Social History Narrative   Lives daughter [name] is in college, home some weekends   Currently separated from husband   Social Determinants of Health   Financial Resource Strain: Low Risk  (09/03/2022)   Overall Financial Resource Strain (CARDIA)    Difficulty of Paying Living Expenses: Not  very hard  Food Insecurity: No Food Insecurity (09/03/2022)   Hunger Vital Sign    Worried About Running Out of Food in the Last Year: Never true    Ran Out of Food in the Last Year: Never true  Transportation Needs: No Transportation Needs (09/03/2022)   PRAPARE - Hydrologist (Medical): No    Lack of Transportation (Non-Medical): No  Physical Activity: Insufficiently Active (09/03/2022)   Exercise Vital Sign    Days of Exercise per Week: 1 day    Minutes of Exercise per Session: 20 min  Stress: No Stress Concern Present (09/03/2022)   [place]    Feeling of Stress : Not at all  Social Connections: Moderately Integrated (09/03/2022)   Social Connection and Isolation Panel [NHANES]    Frequency of Communication with Friends and Family: More than three times a week    Frequency of Social Gatherings with Friends and Family: Twice a week    Attends Religious Services: 1 to 4 times per year    Active Member of [employer] or Organizations: No    Attends Archivist Meetings: Never    Marital Status: Married    Review of Systems Per HPI  Objective:  BP (!) 144/93   Pulse 77   Temp 97.6 F (36.4 C)   Ht  $'5\' 5"'K$  (1.651 m)   Wt 235 lb (106.6 kg)   LMP 12/18/2022 (Approximate)   SpO2 99%   BMI 39.11 kg/m      01/02/2023    1:57 PM 01/02/2023    1:52 PM 10/08/2022    8:58 AM  BP/Weight  Systolic BP 123456 [id number]  Diastolic BP 93 96 97  Wt. (Lbs)  235   BMI  39.11 kg/m2     Physical Exam Vitals and nursing note reviewed.  Constitutional:      General: She is not in acute distress.    Appearance: Normal appearance.  HENT:     Head: Normocephalic and atraumatic.  Eyes:     General:        Right eye: No discharge.        Left eye: No discharge.     Conjunctiva/sclera: Conjunctivae normal.  Cardiovascular:     Rate and Rhythm: Normal rate and regular rhythm.  Pulmonary:      Effort: Pulmonary effort is normal.     Breath sounds: Normal breath sounds. No wheezing, rhonchi or rales.  Neurological:     Mental Status: She is alert.  Psychiatric:        Mood and Affect: Mood normal.        Behavior: Behavior normal.     Lab Results  Component Value Date   WBC 9.3 07/04/2022   HGB 11.7 07/04/2022   HCT 36.6 07/04/2022   PLT 357 07/04/2022   GLUCOSE 99 04/01/2022   CHOL 173 04/01/2022   TRIG 67 04/01/2022   HDL 39 (L) 04/01/2022   LDLCALC 121 (H) 04/01/2022   ALT 13 04/01/2022   AST 10 04/01/2022   NA 143 04/01/2022   K 3.9 04/01/2022   CL 104 04/01/2022   CREATININE 0.68 04/01/2022   BUN 10 04/01/2022   CO2 23 04/01/2022   TSH 1.31 04/30/2016   HGBA1C 5.6 04/01/2022     Assessment & Plan:   Problem List Items Addressed This Visit       Cardiovascular and Mediastinum   Essential hypertension, benign    Not at goal.  Increasing losartan.  Labs ordered.  These are to be obtained in 2 weeks.      Relevant Medications   atorvastatin (LIPITOR) 80 MG tablet   hydrochlorothiazide (HYDRODIURIL) 25 MG tablet   losartan (COZAAR) 100 MG tablet   Other Relevant Orders   CMP14+EGFR     Digestive   GERD (gastroesophageal reflux disease)    Stable on Protonix.  Refilled today.      Relevant Medications   pantoprazole (PROTONIX) 40 MG tablet     Other   Hyperlipidemia    Continue Lipitor.  Lipid panel ordered.      Relevant Medications   atorvastatin (LIPITOR) 80 MG tablet   hydrochlorothiazide (HYDRODIURIL) 25 MG tablet   losartan (COZAAR) 100 MG tablet   Other Relevant Orders   Lipid panel   Other Visit Diagnoses     Screening for deficiency anemia       Relevant Orders   CBC       Meds ordered this encounter  Medications   atorvastatin (LIPITOR) 80 MG tablet    Sig: Take 1 tablet (80 mg total) by mouth daily.    Dispense:  30 tablet    Refill:  5   hydrochlorothiazide (HYDRODIURIL) 25 MG tablet    Sig: Take 0.5 tablets  (12.5 mg total) by mouth daily.    Dispense:  45 tablet    Refill:  3   losartan (COZAAR) 100 MG tablet    Sig: Take 1 tablet (100 mg total) by mouth daily.    Dispense:  30 tablet    Refill:  5   pantoprazole (PROTONIX) 40 MG tablet    Sig: Take 1 tablet (40 mg total) by mouth daily.    Dispense:  30 tablet    Refill:  5    Follow-up:  Return in about 6 months (around 07/05/2023).  [place]

## 2023-01-15 LAB — CMP14+EGFR
ALT: 11 IU/L (ref 0–32)
AST: 11 IU/L (ref 0–40)
Albumin/Globulin Ratio: 1.1 — ABNORMAL LOW (ref 1.2–2.2)
Albumin: 4 g/dL (ref 3.9–4.9)
Alkaline Phosphatase: 117 IU/L (ref 44–121)
BUN/Creatinine Ratio: 13 (ref 9–23)
BUN: 10 mg/dL (ref 6–24)
Bilirubin Total: 0.3 mg/dL (ref 0.0–1.2)
CO2: 24 mmol/L (ref 20–29)
Calcium: 9.5 mg/dL (ref 8.7–10.2)
Chloride: 100 mmol/L (ref 96–106)
Creatinine, Ser: 0.77 mg/dL (ref 0.57–1.00)
Globulin, Total: 3.5 g/dL (ref 1.5–4.5)
Glucose: 108 mg/dL — ABNORMAL HIGH (ref 70–99)
Potassium: 3.7 mmol/L (ref 3.5–5.2)
Sodium: 139 mmol/L (ref 134–144)
Total Protein: 7.5 g/dL (ref 6.0–8.5)
eGFR: 99 mL/min/{1.73_m2} (ref >59)

## 2023-01-15 LAB — LIPID PANEL
Chol/HDL Ratio: 4.5 ratio — ABNORMAL HIGH (ref 0.0–4.4)
Cholesterol, Total: 180 mg/dL (ref 100–199)
HDL: 40 mg/dL (ref >39)
LDL Chol Calc (NIH): 129 mg/dL — ABNORMAL HIGH (ref 0–99)
Triglycerides: 56 mg/dL (ref 0–149)
VLDL Cholesterol Cal: 11 mg/dL (ref 5–40)

## 2023-01-15 LAB — CBC
Hematocrit: 39.4 % (ref 34.0–46.6)
Hemoglobin: 12.5 g/dL (ref 11.1–15.9)
MCH: 26.9 pg (ref 26.6–33.0)
MCHC: 31.7 g/dL (ref 31.5–35.7)
MCV: 85 fL (ref 79–97)
Platelets: 377 10*3/uL (ref 150–450)
RBC: 4.65 x10E6/uL (ref 3.77–5.28)
RDW: 13.6 % (ref 11.7–15.4)
WBC: 6.8 10*3/uL (ref 3.4–10.8)

## 2023-02-04 DIAGNOSIS — M1711 Unilateral primary osteoarthritis, right knee: Secondary | ICD-10-CM | POA: Diagnosis not present

## 2023-03-04 DIAGNOSIS — M1711 Unilateral primary osteoarthritis, right knee: Secondary | ICD-10-CM | POA: Diagnosis not present

## 2023-04-09 ENCOUNTER — Encounter: Payer: Self-pay | Admitting: Emergency Medicine

## 2023-04-09 ENCOUNTER — Ambulatory Visit
Admission: EM | Admit: 2023-04-09 | Discharge: 2023-04-09 | Disposition: A | Discharge status: Home / Self Care | Payer: No Typology Code available for payment source | Attending: Family Medicine | Admitting: Family Medicine

## 2023-04-09 ENCOUNTER — Other Ambulatory Visit: Payer: Self-pay

## 2023-04-09 DIAGNOSIS — L304 Erythema intertrigo: Secondary | ICD-10-CM | POA: Diagnosis not present

## 2023-04-09 DIAGNOSIS — I10 Essential (primary) hypertension: Secondary | ICD-10-CM

## 2023-04-09 MED ORDER — KETOCONAZOLE 2 % EX CREA: TOPICAL_CREAM | CUTANEOUS | 0 refills | Status: DC

## 2023-04-09 MED ORDER — FLUCONAZOLE 100 MG PO TABS: ORAL_TABLET | ORAL | 0 refills | Status: DC

## 2023-04-09 NOTE — Discharge Instructions (Addendum)
Your blood pressure was noted to be elevated during your visit today. If you are currently taking medication for high blood pressure, please ensure you are taking this as directed. If you do not have a history of high blood pressure and your blood pressure remains persistently elevated, you may need to begin taking a medication at some point. You may return here within the next few days to recheck if unable to see your primary care provider or if you do not have a one.  BP (!) 189/97 (BP Location: Right Arm)   Pulse 66   Temp 98.4 F (36.9 C) (Oral)   Resp 20   LMP 03/30/2023 (Approximate)   SpO2 100%   BP Readings from Last 3 Encounters:  04/09/23 (!) 189/97  01/02/23 (!) 144/93  10/08/22 (!) 152/97

## 2023-04-09 NOTE — ED Triage Notes (Signed)
Pt reports burning sensation/redness underneath bilateral breasts and between buttocks x1 week.

## 2023-04-09 NOTE — ED Provider Notes (Signed)
[facility]   [id number] 04/09/23 Arrival Time: 0803  ASSESSMENT & PLAN:  1. Intertrigo   2. Elevated blood pressure reading in office with diagnosis of hypertension     Meds ordered this encounter  Medications   fluconazole (DIFLUCAN) 100 MG tablet    Sig: Take 2 tablets on day # 1 then 1 tablet each additional day until gone.    Dispense:  7 tablet    Refill:  0   ketoconazole (NIZORAL) 2 % cream    Sig: Apply TOPICALLY once daily for 2 weeks    Dispense:  60 g    Refill:  0   No signs of bacterial infection.    Discharge Instructions      Your blood pressure was noted to be elevated during your visit today. If you are currently taking medication for high blood pressure, please ensure you are taking this as directed. If you do not have a history of high blood pressure and your blood pressure remains persistently elevated, you may need to begin taking a medication at some point. You may return here within the next few days to recheck if unable to see your primary care provider or if you do not have a one.  BP (!) 189/97 (BP Location: Right Arm)   Pulse 66   Temp 98.4 F (36.9 C) (Oral)   Resp 20   LMP 03/30/2023 (Approximate)   SpO2 100%   BP Readings from Last 3 Encounters:  04/09/23 (!) 189/97  01/02/23 (!) 144/93  10/08/22 (!) 152/97        Without symptoms related to BP. Rec PCP f/u.  Will follow up with PCP or here if worsening or failing to improve as anticipated. Reviewed expectations re: course of current medical issues. Questions answered. Outlined signs and symptoms indicating need for more acute intervention. Patient verbalized understanding. After Visit Summary given.   SUBJECTIVE:  Miranda Wade is a 42 y.o. female who presents with a skin complaint. Pt reports burning sensation/redness underneath bilateral breasts and between buttocks x1 week. Various OTC without relief. Denies fever.   Increased blood pressure noted today.  Reports that she is treated for HTN.  She reports no chest pain on exertion, no dyspnea on exertion, and no swelling of ankles.   OBJECTIVE: Vitals:   04/09/23 0811  BP: (!) 189/97  Pulse: 66  Resp: 20  Temp: 98.4 F (36.9 C)  TempSrc: Oral  SpO2: 100%    General appearance: alert; no distress HEENT: [place]; AT Neck: supple with FROM Lungs: unlabored Heart: regular Extremities: no edema; moves all extremities normally Skin: warm and dry; moist, red or red-brown, beefy, homogenous patches located under breasts (shows me a picture of her gluteal cleft with the same); some satellite lesions; RN chaperone present Psychological: alert and cooperative; normal mood and affect  No Known Allergies  Past Medical History:  Diagnosis Date   Anemia    Essential hypertension    Hyperlipidemia    Seasonal allergies    Social History   Socioeconomic History   Marital status: Married    Spouse name: Barbara Cower   Number of children: 2   Years of education: 14   Highest education level: Not on file  Occupational History   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  Tobacco Use   Smoking status: Former    Packs/day: .5    Types: Cigarettes    Passive exposure: Current   Smokeless tobacco: Never  Vaping Use  Vaping Use: Never used  Substance and Sexual Activity   Alcohol use: No   Drug use: No   Sexual activity: Yes    Birth control/protection: Surgical    Comment: tubal  Other Topics Concern   Not on file  Social History Narrative   Lives daughter Howell Rucks is in college, home some weekends   Currently separated from husband   Social Determinants of Health   Financial Resource Strain: Low Risk  (09/03/2022)   Overall Financial Resource Strain (CARDIA)    Difficulty of Paying Living Expenses: Not very hard  Food Insecurity: No Food Insecurity (09/03/2022)   Hunger Vital Sign    Worried About Running Out of Food in the Last Year: Never true    Ran Out of Food in  the Last Year: Never true  Transportation Needs: No Transportation Needs (09/03/2022)   PRAPARE - Administrator, Civil Service (Medical): No    Lack of Transportation (Non-Medical): No  Physical Activity: Insufficiently Active (09/03/2022)   Exercise Vital Sign    Days of Exercise per Week: 1 day    Minutes of Exercise per Session: 20 min  Stress: No Stress Concern Present (09/03/2022)   [employer] of Occupational Health - Occupational Stress Questionnaire    Feeling of Stress : Not at all  Social Connections: Moderately Integrated (09/03/2022)   Social Connection and Isolation Panel [NHANES]    Frequency of Communication with Friends and Family: More than three times a week    Frequency of Social Gatherings with Friends and Family: Twice a week    Attends Religious Services: 1 to 4 times per year    Active Member of [employer] or Organizations: No    Attends Banker Meetings: Never    Marital Status: Married  Catering manager Violence: Not At Risk (09/03/2022)   Humiliation, Afraid, Rape, and Kick questionnaire    Fear of Current or Ex-Partner: No    Emotionally Abused: No    Physically Abused: No    Sexually Abused: No   Family History  Problem Relation Age of Onset   Diabetes Mother    Hypertension Mother    Arthritis Mother    Hyperlipidemia Mother    Stroke Mother    Hypertension Father    Heart disease Father 24       Valve replacement   Hyperlipidemia Father    Asthma Daughter    Hypertension Brother    Stroke Maternal Grandmother    Allergies Daughter    Past Surgical History:  Procedure Laterality Date   BIOPSY  04/29/2022   Procedure: BIOPSY;  Surgeon: Lanelle Bal, DO;  [facility];  Service: Endoscopy;;   COLONOSCOPY WITH PROPOFOL N/A 04/29/2022   Procedure: COLONOSCOPY WITH PROPOFOL;  Surgeon: Lanelle Bal, DO;  [facility];  Service: Endoscopy;  Laterality: N/A;  2:00pm    ESOPHAGOGASTRODUODENOSCOPY (EGD) WITH PROPOFOL N/A 04/29/2022   Procedure: ESOPHAGOGASTRODUODENOSCOPY (EGD) WITH PROPOFOL;  Surgeon: Lanelle Bal, DO;  [facility];  Service: Endoscopy;  Laterality: N/A;   LAPAROSCOPIC BILATERAL SALPINGECTOMY Bilateral 05/01/2022   Procedure: LAPAROSCOPIC BILATERAL SALPINGECTOMY;  Surgeon: Lazaro Arms, MD;  Location: [facility];  Service: Gynecology;  Laterality: Bilateral;   MASS EXCISION Right 08/04/2017   Procedure: EXCISION OF RIGHT NASAL  MASS;  Surgeon: Newman Pies, MD;  Location:  [facility];  Service: ENT;  Laterality: Right;  LOCAL   POLYPECTOMY  04/29/2022  Procedure: POLYPECTOMY;  Surgeon: Lanelle Bal, DO;  [facility];  Service: Endoscopy;;   REMOVAL OF NON VAGINAL CONTRACEPTIVE DEVICE N/A 05/01/2022   Procedure: REMOVAL OF NON VAGINAL CONTRACEPTIVE DEVICE;  Surgeon: Lazaro Arms, MD;  Location: [facility];  Service: Gynecology;  Laterality: N/A;   WRIST SURGERY        Mardella Layman, MD 04/09/23 [phone]

## 2023-04-17 ENCOUNTER — Ambulatory Visit: Payer: No Typology Code available for payment source | Admitting: Obstetrics & Gynecology

## 2023-04-17 ENCOUNTER — Encounter: Payer: Self-pay | Admitting: Obstetrics & Gynecology

## 2023-04-17 VITALS — BP 160/105 | HR 71 | Ht 65.0 in

## 2023-04-17 DIAGNOSIS — R739 Hyperglycemia, unspecified: Secondary | ICD-10-CM

## 2023-04-17 DIAGNOSIS — I10 Essential (primary) hypertension: Secondary | ICD-10-CM | POA: Diagnosis not present

## 2023-04-17 DIAGNOSIS — B3731 Acute candidiasis of vulva and vagina: Secondary | ICD-10-CM | POA: Diagnosis not present

## 2023-04-17 MED ORDER — GERHARDT'S BUTT CREAM: TOPICAL_CREAM | Freq: Three times a day (TID) | CUTANEOUS | Status: AC

## 2023-04-17 MED ORDER — FLUCONAZOLE 100 MG PO TABS
100.0000 mg | ORAL_TABLET | Freq: Every day | ORAL | 0 refills | Status: DC
Start: 2023-04-17 — End: 2023-06-13

## 2023-04-17 MED ORDER — GERHARDT'S BUTT CREAM: TOPICAL_CREAM | Freq: Three times a day (TID) | CUTANEOUS | Status: DC

## 2023-04-17 NOTE — Progress Notes (Signed)
Chief Complaint  Patient presents with   rash around vaginal area    Urgent Care 2 weeks ago with treatment with ketoconazole cream and Diflucan. Once medication was finished, rash came back. Started in rectal area and now is moving more towards front      42 y.o. [id number] Patient's last menstrual period was 03/30/2023 (approximate). The current method of family planning is tubal ligation.  Outpatient Encounter Medications as of 04/17/2023  Medication Sig   Ascorbic Acid (VITAMIN C PO) Take 1 tablet by mouth daily.   atorvastatin (LIPITOR) 80 MG tablet Take 1 tablet (80 mg total) by mouth daily.   ferrous sulfate 325 (65 FE) MG tablet Take 325 mg by mouth daily with breakfast.   fluconazole (DIFLUCAN) 100 MG tablet Take 1 tablet (100 mg total) by mouth daily.   hydrochlorothiazide (HYDRODIURIL) 25 MG tablet Take 0.5 tablets (12.5 mg total) by mouth daily.   ibuprofen (ADVIL) 600 MG tablet Take 600 mg by mouth every 6 (six) hours as needed for moderate pain.   ketoconazole (NIZORAL) 2 % cream Apply TOPICALLY once daily for 2 weeks   losartan (COZAAR) 100 MG tablet Take 1 tablet (100 mg total) by mouth daily.   Omega-3 Fatty Acids (FISH OIL PO) Take 1 capsule by mouth daily.   pantoprazole (PROTONIX) 40 MG tablet Take 1 tablet (40 mg total) by mouth daily.   fluticasone (FLONASE) 50 MCG/ACT nasal spray Place 2 sprays into both nostrils daily. (Patient not taking: Reported on 04/17/2023)   [DISCONTINUED] fluconazole (DIFLUCAN) 100 MG tablet Take 2 tablets on day # 1 then 1 tablet each additional day until gone. (Patient not taking: Reported on 04/17/2023)   Facility-Administered Encounter Medications as of 04/17/2023  Medication   Gerhardt's butt cream   [DISCONTINUED] Gerhardt's butt cream    Subjective Pt with persistent vulvovaginitis and area under left breast Was treated with diflucan with improvement but returned Last A1C was 5.6 11 months ago Past Medical History:   Diagnosis Date   Anemia    Essential hypertension    Hyperlipidemia    Seasonal allergies     Past Surgical History:  Procedure Laterality Date   BIOPSY  04/29/2022   Procedure: BIOPSY;  Surgeon: Lanelle Bal, DO;  [facility];  Service: Endoscopy;;   COLONOSCOPY WITH PROPOFOL N/A 04/29/2022   Procedure: COLONOSCOPY WITH PROPOFOL;  Surgeon: Lanelle Bal, DO;  [facility];  Service: Endoscopy;  Laterality: N/A;  2:00pm   ESOPHAGOGASTRODUODENOSCOPY (EGD) WITH PROPOFOL N/A 04/29/2022   Procedure: ESOPHAGOGASTRODUODENOSCOPY (EGD) WITH PROPOFOL;  Surgeon: Lanelle Bal, DO;  [facility];  Service: Endoscopy;  Laterality: N/A;   LAPAROSCOPIC BILATERAL SALPINGECTOMY Bilateral 05/01/2022   Procedure: LAPAROSCOPIC BILATERAL SALPINGECTOMY;  Surgeon: Lazaro Arms, MD;  Location: [facility];  Service: Gynecology;  Laterality: Bilateral;   MASS EXCISION Right 08/04/2017   Procedure: EXCISION OF RIGHT NASAL  MASS;  Surgeon: Newman Pies, MD;  Location: [facility];  Service: ENT;  Laterality: Right;  LOCAL   POLYPECTOMY  04/29/2022   Procedure: POLYPECTOMY;  Surgeon: Lanelle Bal, DO;  [facility];  Service: Endoscopy;;   REMOVAL OF NON VAGINAL CONTRACEPTIVE DEVICE N/A 05/01/2022   Procedure: REMOVAL OF NON VAGINAL CONTRACEPTIVE DEVICE;  Surgeon: Lazaro Arms, MD;  Location: [facility];  Service: Gynecology;  Laterality: N/A;   WRIST SURGERY      OB History     Gravida  3   Para  2   Term  2   Preterm      AB  1   Living  2      SAB      IAB      Ectopic      Multiple      Live Births  1           No Known Allergies  Social History   Socioeconomic History   Marital status: Married    Spouse name: Barbara Cower   Number of children: 2   Years of education: 14   Highest education level: Not on file  Occupational History   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  Tobacco Use   Smoking status: Former     Packs/day: .5    Types: Cigarettes    Passive exposure: Current   Smokeless tobacco: Never  Vaping Use   Vaping Use: Never used  Substance and Sexual Activity   Alcohol use: No   Drug use: No   Sexual activity: Yes    Birth control/protection: Surgical    Comment: tubal  Other Topics Concern   Not on file  Social History Narrative   Lives daughter Howell Rucks is in college, home some weekends   Currently separated from husband   Social Determinants of Health   Financial Resource Strain: Low Risk  (09/03/2022)   Overall Financial Resource Strain (CARDIA)    Difficulty of Paying Living Expenses: Not very hard  Food Insecurity: No Food Insecurity (09/03/2022)   Hunger Vital Sign    Worried About Running Out of Food in the Last Year: Never true    Ran Out of Food in the Last Year: Never true  Transportation Needs: No Transportation Needs (09/03/2022)   PRAPARE - Administrator, Civil Service (Medical): No    Lack of Transportation (Non-Medical): No  Physical Activity: Insufficiently Active (09/03/2022)   Exercise Vital Sign    Days of Exercise per Week: 1 day    Minutes of Exercise per Session: 20 min  Stress: No Stress Concern Present (09/03/2022)   [employer] of Occupational Health - Occupational Stress Questionnaire    Feeling of Stress : Not at all  Social Connections: Moderately Integrated (09/03/2022)   Social Connection and Isolation Panel [NHANES]    Frequency of Communication with Friends and Family: More than three times a week    Frequency of Social Gatherings with Friends and Family: Twice a week    Attends Religious Services: 1 to 4 times per year    Active Member of [employer] or Organizations: No    Attends Engineer, structural: Never    Marital Status: Married    Family History  Problem Relation Age of Onset   Diabetes Mother    Hypertension Mother    Arthritis Mother    Hyperlipidemia Mother    Stroke Mother     Hypertension Father    Heart disease Father 60       Valve replacement   Hyperlipidemia Father    Asthma Daughter    Hypertension Brother    Stroke Maternal Grandmother    Allergies Daughter     Medications:       Current Outpatient Medications:    Ascorbic Acid (VITAMIN C PO), Take 1 tablet by mouth daily., Disp: , Rfl:    atorvastatin (LIPITOR) 80 MG tablet, Take 1 tablet (80 mg total) by mouth daily., Disp: 30 tablet,  Rfl: 5   ferrous sulfate 325 (65 FE) MG tablet, Take 325 mg by mouth daily with breakfast., Disp: , Rfl:    fluconazole (DIFLUCAN) 100 MG tablet, Take 1 tablet (100 mg total) by mouth daily., Disp: 14 tablet, Rfl: 0   hydrochlorothiazide (HYDRODIURIL) 25 MG tablet, Take 0.5 tablets (12.5 mg total) by mouth daily., Disp: 45 tablet, Rfl: 3   ibuprofen (ADVIL) 600 MG tablet, Take 600 mg by mouth every 6 (six) hours as needed for moderate pain., Disp: , Rfl:    ketoconazole (NIZORAL) 2 % cream, Apply TOPICALLY once daily for 2 weeks, Disp: 60 g, Rfl: 0   losartan (COZAAR) 100 MG tablet, Take 1 tablet (100 mg total) by mouth daily., Disp: 30 tablet, Rfl: 5   Omega-3 Fatty Acids (FISH OIL PO), Take 1 capsule by mouth daily., Disp: , Rfl:    pantoprazole (PROTONIX) 40 MG tablet, Take 1 tablet (40 mg total) by mouth daily., Disp: 30 tablet, Rfl: 5   fluticasone (FLONASE) 50 MCG/ACT nasal spray, Place 2 sprays into both nostrils daily. (Patient not taking: Reported on 04/17/2023), Disp: 16 g, Rfl: 0  Current Facility-Administered Medications:    Gerhardt's butt cream, , Topical, TID, Krystl Wickware, Amaryllis Dyke, MD  Objective Blood pressure (!) 160/105, pulse 71, height 5\' 5"  (1.651 m), last menstrual period 03/30/2023.  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 Candidal vulvovaginitis, resistant  B37.31 Hemoglobin A1C    2. Hyperglycemia  R73.9 Hemoglobin A1C    3. Essential hypertension, benign  I10    Going over to see Dr Adriana Simas now(they are in our building)        Medications prescribed during  this encounter: Meds ordered this encounter  Medications   fluconazole (DIFLUCAN) 100 MG tablet    Sig: Take 1 tablet (100 mg total) by mouth daily.    Dispense:  14 tablet    Refill:  0   DISCONTD: Gerhardt's butt cream   Gerhardt's butt cream    Compounded by [employer] or Scans Ordered during this encounter: Orders Placed This Encounter  Procedures   Hemoglobin A1C      Follow up No follow-ups on file.

## 2023-04-18 ENCOUNTER — Ambulatory Visit (INDEPENDENT_AMBULATORY_CARE_PROVIDER_SITE_OTHER): Payer: No Typology Code available for payment source | Admitting: Family Medicine

## 2023-04-18 ENCOUNTER — Encounter: Payer: Self-pay | Admitting: Family Medicine

## 2023-04-18 VITALS — BP 142/90 | HR 73 | Temp 98.1°F | Ht 65.0 in | Wt 227.0 lb

## 2023-04-18 DIAGNOSIS — I10 Essential (primary) hypertension: Secondary | ICD-10-CM

## 2023-04-18 LAB — HEMOGLOBIN A1C
Est. average glucose Bld gHb Est-mCnc: 117 mg/dL
Hgb A1c MFr Bld: 5.7 % — ABNORMAL HIGH (ref 4.8–5.6)

## 2023-04-18 MED ORDER — HYDROCHLOROTHIAZIDE 25 MG PO TABS: 25.0000 mg | ORAL_TABLET | Freq: Every day | ORAL | 3 refills | Status: DC

## 2023-04-18 NOTE — Patient Instructions (Signed)
Hydrochlorothiazide 25 mg daily.  Continue Losartan.  Follow up in 1 month.

## 2023-04-20 NOTE — Assessment & Plan Note (Signed)
Recent worsening/uncontrolled.  Increasing Hydrochlorothiazide to 25 mg daily. Continue Losartan.

## 2023-04-20 NOTE — Progress Notes (Signed)
Subjective:  Patient ID: Miranda Wade, female    DOB: 05-May-1981  Age: 42 y.o. MRN: [id number]  CC: Chief Complaint  Patient presents with   Hypertension    HPI:  42 year old female presents for follow up regarding hypertension.  BP has been elevated. Recently elevated at 160/105 and OB/GYN on 6/27.  No chest pain, SOB or other symptoms. Compliant with Losartan 100 mg and Hydrochlorothiazide 12.5 mg. Will discuss medication changes today.  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  Tobacco Use   Smoking status: Former    Packs/day: .5    Types: Cigarettes    Passive exposure: Current   Smokeless tobacco: Never  Vaping Use   Vaping Use: Never used  Substance and Sexual Activity   Alcohol use: No   Drug use: No   Sexual activity: Yes    Birth control/protection: Surgical    Comment: tubal  Other Topics Concern   Not on file  Social History Narrative   Lives daughter Howell Rucks is in college, home some weekends   Currently separated from husband   Social Determinants of Health   Financial Resource Strain: Low Risk  (09/03/2022)   Overall Financial Resource Strain (CARDIA)    Difficulty of Paying Living Expenses: Not very hard  Food Insecurity: No Food Insecurity (04/17/2023)   Hunger Vital Sign    Worried About Running Out of Food in the Last Year: Never true    Ran Out of Food in the Last Year: Never true  Transportation Needs: No Transportation Needs (04/17/2023)    PRAPARE - Administrator, Civil Service (Medical): No    Lack of Transportation (Non-Medical): No  Physical Activity: Insufficiently Active (04/17/2023)   Exercise Vital Sign    Days of Exercise per Week: 3 days    Minutes of Exercise per Session: 30 min  Stress: No Stress Concern Present (09/03/2022)   [employer] of Occupational Health - Occupational Stress Questionnaire    Feeling of Stress : Not at all  Social Connections: Moderately Integrated (04/17/2023)   Social Connection and Isolation Panel [NHANES]    Frequency of Communication with Friends and Family: More than three times a week    Frequency of Social Gatherings with Friends and Family: Once a week    Attends Religious Services: 1 to 4 times per year    Active Member of [employer] or Organizations: No    Attends Engineer, structural: Never    Marital Status: Married    Review of Systems Per HPI  Objective:  BP (!) 142/90   Pulse 73   Temp 98.1 F (36.7 C)   Ht 5\' 5"  (1.651 m)   Wt 227 lb (103 kg)   LMP 03/30/2023 (Approximate)   SpO2 100%   BMI 37.77 kg/m      04/18/2023    3:53 PM 04/17/2023    8:43 AM 04/17/2023    8:34 AM  BP/Weight  Systolic BP 142 160 159  Diastolic BP 90 105 104  Wt. (Lbs) 227    BMI 37.77 kg/m2      Physical Exam Vitals and nursing note reviewed.  Constitutional:      General: She is not in acute distress.    Appearance: Normal appearance.  HENT:     Head: Normocephalic and atraumatic.  Eyes:     General:        Right eye: No discharge.        Left eye: No discharge.     Conjunctiva/sclera: Conjunctivae normal.  Cardiovascular:     Rate and Rhythm: Normal rate and regular rhythm.  Pulmonary:     Effort: Pulmonary effort is normal.     Breath sounds: Normal breath sounds. No wheezing, rhonchi or rales.  Neurological:     Mental Status: She is alert.  Psychiatric:        Mood and Affect: Mood normal.        Behavior: Behavior normal.     Lab  Results  Component Value Date   WBC 6.8 01/14/2023   HGB 12.5 01/14/2023   HCT 39.4 01/14/2023   PLT 377 01/14/2023   GLUCOSE 108 (H) 01/14/2023   CHOL 180 01/14/2023   TRIG 56 01/14/2023   HDL 40 01/14/2023   LDLCALC 129 (H) 01/14/2023   ALT 11 01/14/2023   AST 11 01/14/2023   NA 139 01/14/2023   K 3.7 01/14/2023   CL 100 01/14/2023   CREATININE 0.77 01/14/2023   BUN 10 01/14/2023   CO2 24 01/14/2023   TSH 1.31 04/30/2016   HGBA1C 5.7 (H) 04/17/2023     Assessment & Plan:   Problem List Items Addressed This Visit       Cardiovascular and Mediastinum   Essential hypertension, benign - Primary    Recent worsening/uncontrolled.  Increasing Hydrochlorothiazide to 25 mg daily. Continue Losartan.      Relevant Medications   hydrochlorothiazide (HYDRODIURIL) 25 MG tablet    Meds ordered this encounter  Medications   hydrochlorothiazide (HYDRODIURIL) 25 MG tablet    Sig: Take 1 tablet (25 mg total) by mouth daily.    Dispense:  90 tablet    Refill:  3    Follow-up:  Return in about 1 month (around 05/18/2023).  Everlene Other DO [facility]

## 2023-04-22 ENCOUNTER — Encounter: Payer: Self-pay | Admitting: Obstetrics & Gynecology

## 2023-04-29 DIAGNOSIS — M1711 Unilateral primary osteoarthritis, right knee: Secondary | ICD-10-CM | POA: Diagnosis not present

## 2023-05-08 ENCOUNTER — Ambulatory Visit (INDEPENDENT_AMBULATORY_CARE_PROVIDER_SITE_OTHER): Payer: No Typology Code available for payment source | Admitting: Obstetrics & Gynecology

## 2023-05-08 ENCOUNTER — Encounter: Payer: Self-pay | Admitting: Obstetrics & Gynecology

## 2023-05-08 VITALS — BP 139/97 | HR 74 | Ht 65.0 in | Wt 231.0 lb

## 2023-05-08 DIAGNOSIS — B3731 Acute candidiasis of vulva and vagina: Secondary | ICD-10-CM | POA: Diagnosis not present

## 2023-05-08 NOTE — Progress Notes (Signed)
Follow up appointment for response: [facility]   Chief Complaint  Patient presents with   Follow-up    Blood pressure (!) 139/97, pulse 74, height 5\' 5"  (1.651 m), weight 231 lb (104.8 kg), last menstrual period 03/30/2023.  Pt states her symptoms have resolved  No further itching or burning  Exam  Normal with reoslution  MEDS ordered this encounter: No orders of the defined types were placed in this encounter.   Orders for this encounter: No orders of the defined types were placed in this encounter.   Impression + Management Plan   ICD-10-CM   1. Candidal vulvovaginitis, resistant: resolved  B37.31    continue fammy cream for prophylaxis      Follow Up: Return if symptoms worsen or fail to improve.     All questions were answered.  Past Medical History:  Diagnosis Date   Anemia    Essential hypertension    Hyperlipidemia    Seasonal allergies     Past Surgical History:  Procedure Laterality Date   BIOPSY  04/29/2022   Procedure: BIOPSY;  Surgeon: Lanelle Bal, DO;  [facility];  Service: Endoscopy;;   COLONOSCOPY WITH PROPOFOL N/A 04/29/2022   Procedure: COLONOSCOPY WITH PROPOFOL;  Surgeon: Lanelle Bal, DO;  [facility];  Service: Endoscopy;  Laterality: N/A;  2:00pm   ESOPHAGOGASTRODUODENOSCOPY (EGD) WITH PROPOFOL N/A 04/29/2022   Procedure: ESOPHAGOGASTRODUODENOSCOPY (EGD) WITH PROPOFOL;  Surgeon: Lanelle Bal, DO;  [facility];  Service: Endoscopy;  Laterality: N/A;   LAPAROSCOPIC BILATERAL SALPINGECTOMY Bilateral 05/01/2022   Procedure: LAPAROSCOPIC BILATERAL SALPINGECTOMY;  Surgeon: Lazaro Arms, MD;  Location: [facility];  Service: Gynecology;  Laterality: Bilateral;   MASS EXCISION Right 08/04/2017   Procedure: EXCISION OF RIGHT NASAL  MASS;  Surgeon: Newman Pies, MD;  Location: [facility];  Service: ENT;  Laterality: Right;  LOCAL   POLYPECTOMY  04/29/2022   Procedure: POLYPECTOMY;  Surgeon: Lanelle Bal, DO;  [facility];  Service: Endoscopy;;   REMOVAL OF NON VAGINAL CONTRACEPTIVE DEVICE N/A 05/01/2022   Procedure: REMOVAL OF NON VAGINAL CONTRACEPTIVE DEVICE;  Surgeon: Lazaro Arms, MD;  Location: [facility];  Service: Gynecology;  Laterality: N/A;   WRIST SURGERY      OB History     Gravida  3   Para  2   Term  2   Preterm      AB  1   Living  2      SAB      IAB      Ectopic      Multiple      Live Births  1           No Known Allergies  Social History   Socioeconomic History   Marital status: Married    Spouse name: Barbara Cower   Number of children: 2   Years of education: 14   Highest education level: GED or equivalent  Occupational History   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  Tobacco Use   Smoking status: Former    Current packs/day: 0.50    Types: Cigarettes    Passive exposure: Current   Smokeless tobacco: Never  Vaping Use   Vaping status: Never Used  Substance and Sexual Activity   Alcohol use: No   Drug use: No   Sexual activity: Yes    Birth control/protection: Surgical    Comment: tubal  Other Topics Concern  Not on file  Social History Narrative   Lives daughter Howell Rucks is in college, home some weekends   Currently separated from husband   Social Determinants of Health   Financial Resource Strain: Low Risk  (09/03/2022)   Overall Financial Resource Strain (CARDIA)    Difficulty of Paying Living Expenses: Not very hard  Food Insecurity: No Food Insecurity (04/17/2023)   Hunger Vital Sign    Worried About Running Out of Food in the Last Year: Never true    Ran Out of Food in the Last Year: Never true  Transportation Needs: No Transportation Needs (04/17/2023)   PRAPARE - Administrator, Civil Service (Medical): No    Lack of Transportation (Non-Medical): No  Physical Activity: Insufficiently Active (04/17/2023)   Exercise Vital Sign    Days of Exercise per Week: 3 days     Minutes of Exercise per Session: 30 min  Stress: No Stress Concern Present (09/03/2022)   [employer] of Occupational Health - Occupational Stress Questionnaire    Feeling of Stress : Not at all  Social Connections: Moderately Integrated (04/17/2023)   Social Connection and Isolation Panel [NHANES]    Frequency of Communication with Friends and Family: More than three times a week    Frequency of Social Gatherings with Friends and Family: Once a week    Attends Religious Services: 1 to 4 times per year    Active Member of [employer] or Organizations: No    Attends Banker Meetings: Never    Marital Status: Married    Family History  Problem Relation Age of Onset   Diabetes Mother    Hypertension Mother    Arthritis Mother    Hyperlipidemia Mother    Stroke Mother    Hypertension Father    Heart disease Father 65       Valve replacement   Hyperlipidemia Father    Asthma Daughter    Hypertension Brother    Stroke Maternal Grandmother    Allergies Daughter

## 2023-05-10 DIAGNOSIS — M25561 Pain in right knee: Secondary | ICD-10-CM | POA: Diagnosis not present

## 2023-05-18 ENCOUNTER — Encounter: Payer: Self-pay | Admitting: Obstetrics & Gynecology

## 2023-05-21 DIAGNOSIS — S83241A Other tear of medial meniscus, current injury, right knee, initial encounter: Secondary | ICD-10-CM | POA: Diagnosis not present

## 2023-05-22 ENCOUNTER — Ambulatory Visit: Payer: No Typology Code available for payment source | Admitting: Family Medicine

## 2023-05-23 ENCOUNTER — Ambulatory Visit (INDEPENDENT_AMBULATORY_CARE_PROVIDER_SITE_OTHER): Payer: No Typology Code available for payment source | Admitting: Family Medicine

## 2023-05-23 ENCOUNTER — Encounter: Payer: Self-pay | Admitting: Family Medicine

## 2023-05-23 VITALS — BP 132/84 | HR 76 | Temp 97.2°F | Ht 65.0 in | Wt 226.0 lb

## 2023-05-23 DIAGNOSIS — I10 Essential (primary) hypertension: Secondary | ICD-10-CM

## 2023-05-23 DIAGNOSIS — R197 Diarrhea, unspecified: Secondary | ICD-10-CM | POA: Diagnosis not present

## 2023-05-23 NOTE — Patient Instructions (Signed)
Continue your medication  Follow up in 6 months  

## 2023-05-25 DIAGNOSIS — R197 Diarrhea, unspecified: Secondary | ICD-10-CM | POA: Insufficient documentation

## 2023-05-25 NOTE — Assessment & Plan Note (Signed)
Improved.  Continue current medications  °

## 2023-05-25 NOTE — Assessment & Plan Note (Signed)
Supportive care. 

## 2023-05-25 NOTE — Progress Notes (Signed)
Subjective:  Patient ID: Miranda Wade, female    DOB: 1981-08-10  Age: 42 y.o. MRN: [id number]  CC: Chief Complaint  Patient presents with   Hypertension    Follow up reported readings have been 114/80's this past week / patient reported having loose stool all week but feeling better today    HPI:  42 year old female presents for follow up regarding HTN.  BP significantly improved. Doing well on hydrochlorothiazide and Losartan.  Recent diarrhea and associated fatigue. Diarrhea improving. No fever. No abdominal pain.  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  Tobacco Use   Smoking status: Former    Current packs/day: 0.50    Types: Cigarettes    Passive exposure: Current   Smokeless tobacco: Never  Vaping Use   Vaping status: Never Used  Substance and Sexual Activity   Alcohol use: No   Drug use: No   Sexual activity: Yes    Birth control/protection: Surgical    Comment: tubal  Other Topics Concern   Not on file  Social History Narrative   Lives daughter Howell Rucks is in college, home some weekends   Currently separated from husband   Social Determinants of Health   Financial Resource Strain: Low Risk  (09/03/2022)   Overall Financial Resource Strain (CARDIA)    Difficulty of Paying Living Expenses: Not very hard  Food Insecurity: No Food Insecurity (04/17/2023)   Hunger Vital Sign    Worried About Running Out of Food in the Last Year: Never  true    Ran Out of Food in the Last Year: Never true  Transportation Needs: No Transportation Needs (04/17/2023)   PRAPARE - Administrator, Civil Service (Medical): No    Lack of Transportation (Non-Medical): No  Physical Activity: Insufficiently Active (04/17/2023)   Exercise Vital Sign    Days of Exercise per Week: 3 days    Minutes of Exercise per Session: 30 min  Stress: No Stress Concern Present (09/03/2022)   [employer] of Occupational Health - Occupational Stress Questionnaire    Feeling of Stress : Not at all  Social Connections: Moderately Integrated (04/17/2023)   Social Connection and Isolation Panel [NHANES]    Frequency of Communication with Friends and Family: More than three times a week    Frequency of Social Gatherings with Friends and Family: Once a week    Attends Religious Services: 1 to 4 times per year    Active Member of [employer] or Organizations: No    Attends Engineer, structural: Never    Marital Status: Married    Review of Systems Per HPI  Objective:  BP 132/84   Pulse 76   Temp (!) 97.2 F (36.2 C)   Ht 5\' 5"  (1.651 m)   Wt 226 lb (102.5 kg)   SpO2 97%   BMI 37.61 kg/m      05/23/2023    9:27 AM 05/08/2023  3:58 PM 04/18/2023    3:53 PM  BP/Weight  Systolic BP 132 139 142  Diastolic BP 84 97 90  Wt. (Lbs) 226 231 227  BMI 37.61 kg/m2 38.44 kg/m2 37.77 kg/m2    Physical Exam Vitals and nursing note reviewed.  Constitutional:      General: She is not in acute distress.    Appearance: Normal appearance.  Cardiovascular:     Rate and Rhythm: Normal rate and regular rhythm.  Pulmonary:     Effort: Pulmonary effort is normal.     Breath sounds: Normal breath sounds. No wheezing, rhonchi or rales.  Neurological:     Mental Status: She is alert.  Psychiatric:        Mood and Affect: Mood normal.        Behavior: Behavior normal.     Lab Results  Component Value Date   WBC 6.8 01/14/2023   HGB 12.5  01/14/2023   HCT 39.4 01/14/2023   PLT 377 01/14/2023   GLUCOSE 108 (H) 01/14/2023   CHOL 180 01/14/2023   TRIG 56 01/14/2023   HDL 40 01/14/2023   LDLCALC 129 (H) 01/14/2023   ALT 11 01/14/2023   AST 11 01/14/2023   NA 139 01/14/2023   K 3.7 01/14/2023   CL 100 01/14/2023   CREATININE 0.77 01/14/2023   BUN 10 01/14/2023   CO2 24 01/14/2023   TSH 1.31 04/30/2016   HGBA1C 5.7 (H) 04/17/2023     Assessment & Plan:   Problem List Items Addressed This Visit       Cardiovascular and Mediastinum   Essential hypertension, benign - Primary    Improved. Continue current medications.        Other   Diarrhea    Supportive care.       Follow-up:  Return in about 6 months (around 11/23/2023).  Everlene Other DO [facility]

## 2023-06-04 IMAGING — MG MM DIGITAL SCREENING BILAT W/ TOMO AND CAD
8 series · 8 of 24 positions shown · non-contrast
Comparison: None.

CLINICAL DATA: Screening.

EXAM:
DIGITAL SCREENING BILATERAL MAMMOGRAM WITH TOMOSYNTHESIS AND CAD
TECHNIQUE: Bilateral screening digital craniocaudal and mediolateral oblique
mammograms were obtained. Bilateral screening digital breast
tomosynthesis was performed. The images were evaluated with
computer-aided detection.

[L MLO synth-2D]
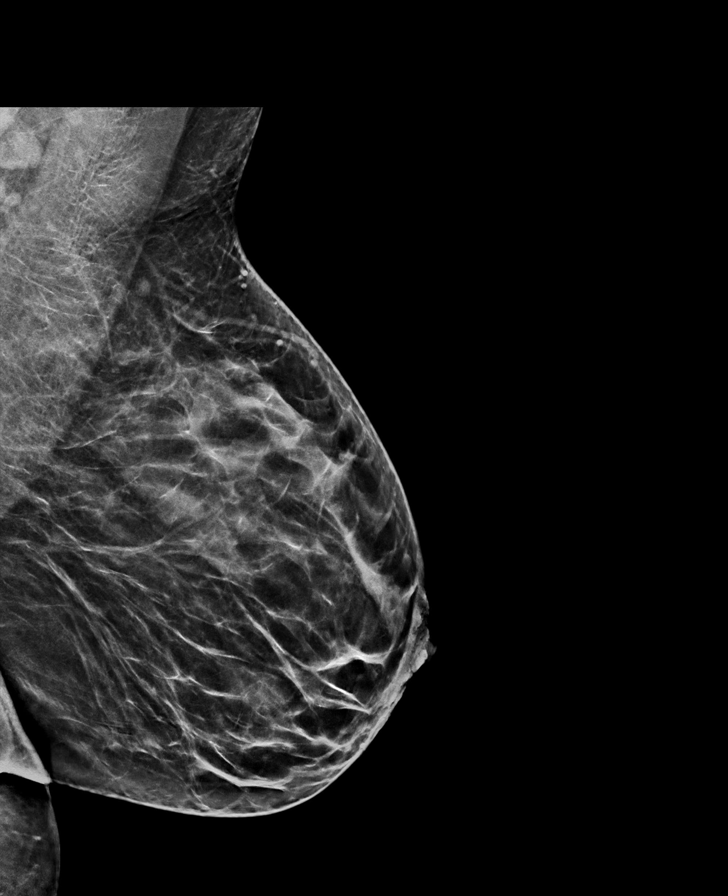

[R CC synth-2D]
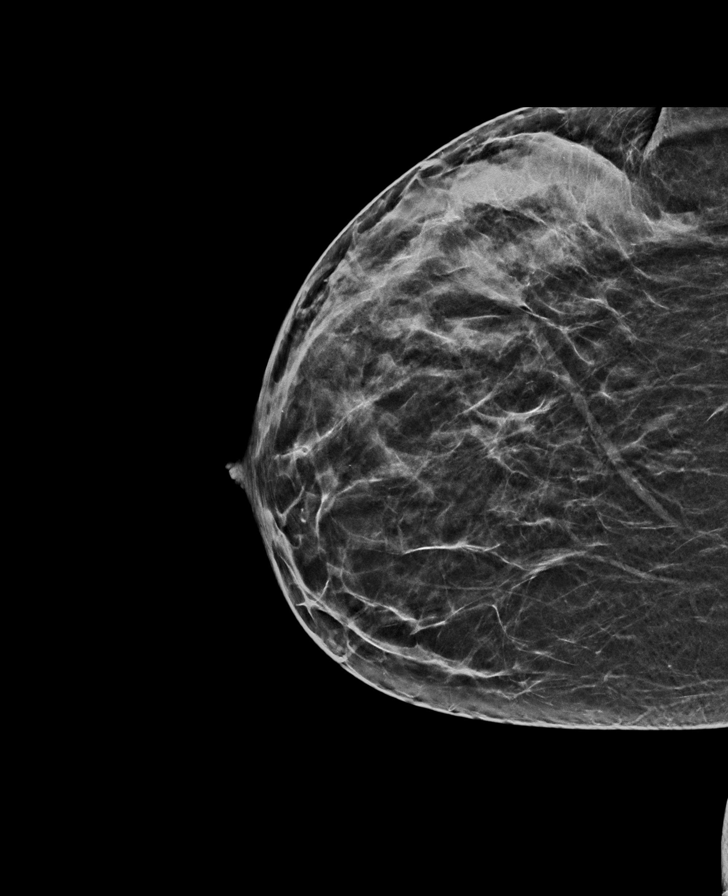

[R MLO synth-2D]
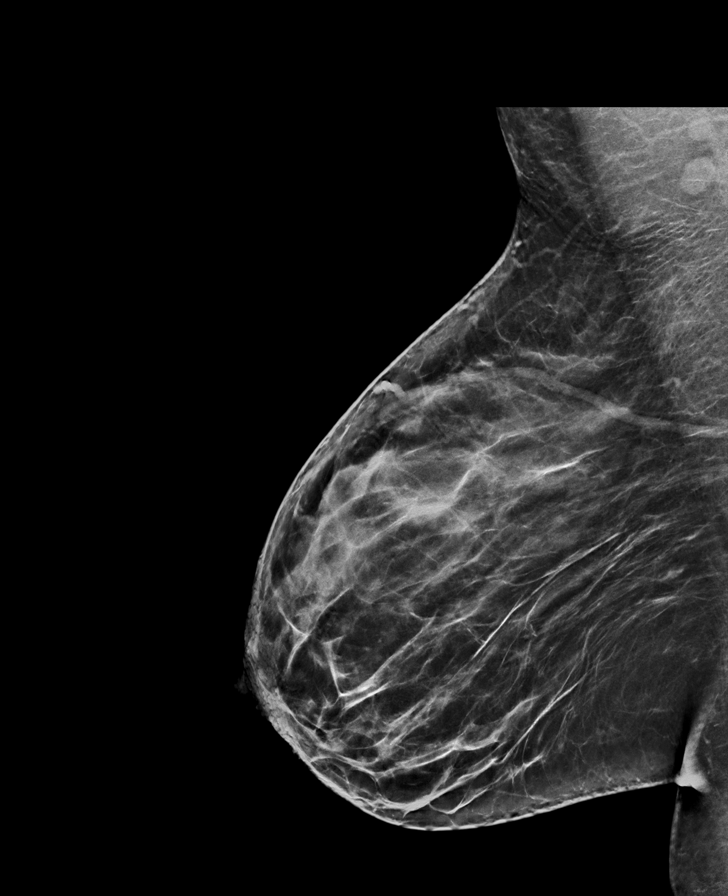

[L CC synth-2D]
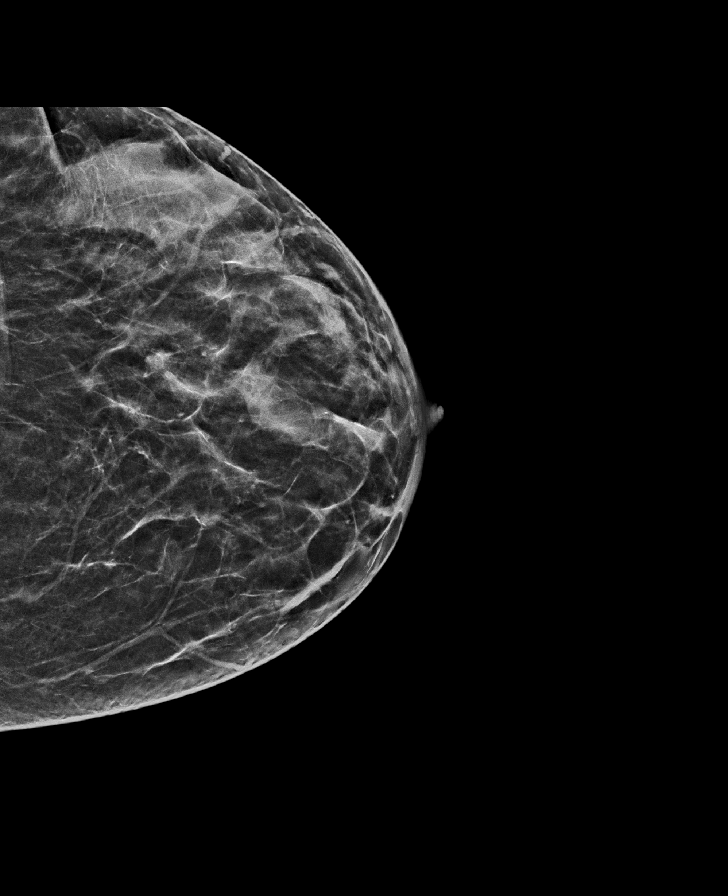

[L CC tomo · tomo slice 29/58.0]
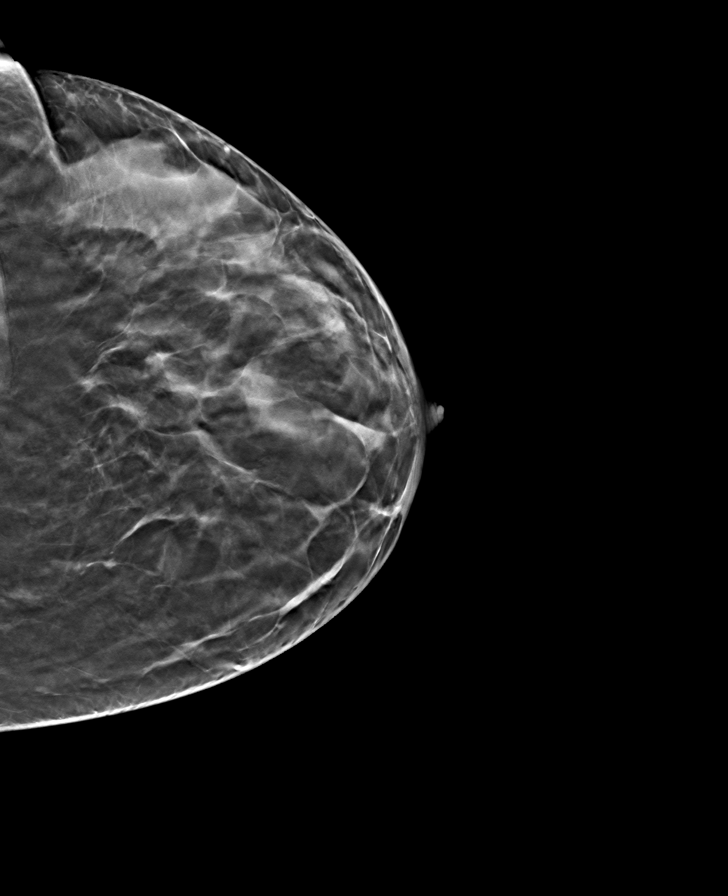

[R MLO tomo · tomo slice 36/71.0]
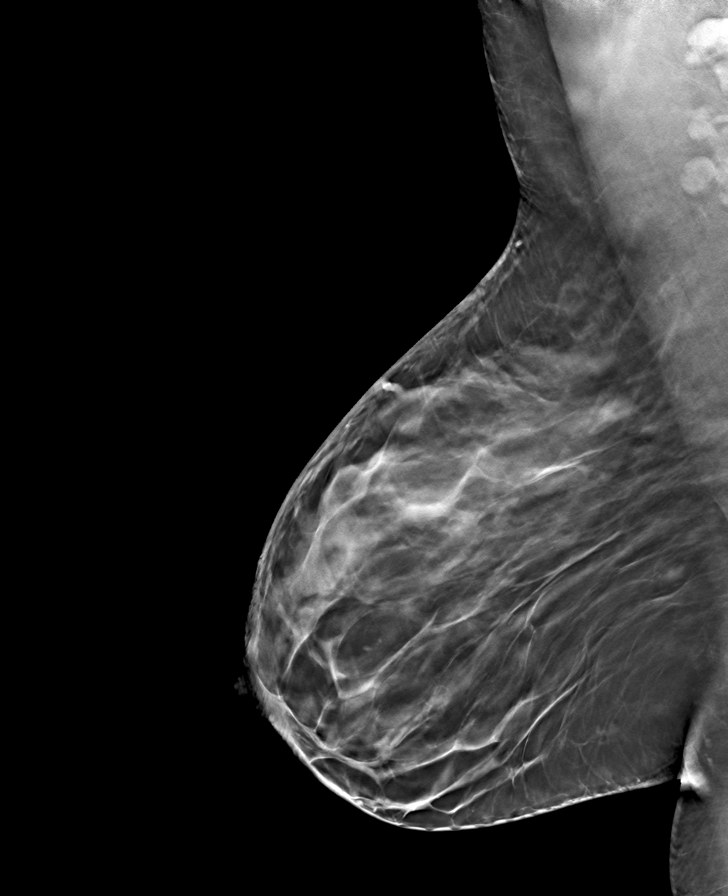

[L MLO tomo · tomo slice 35/68.0]
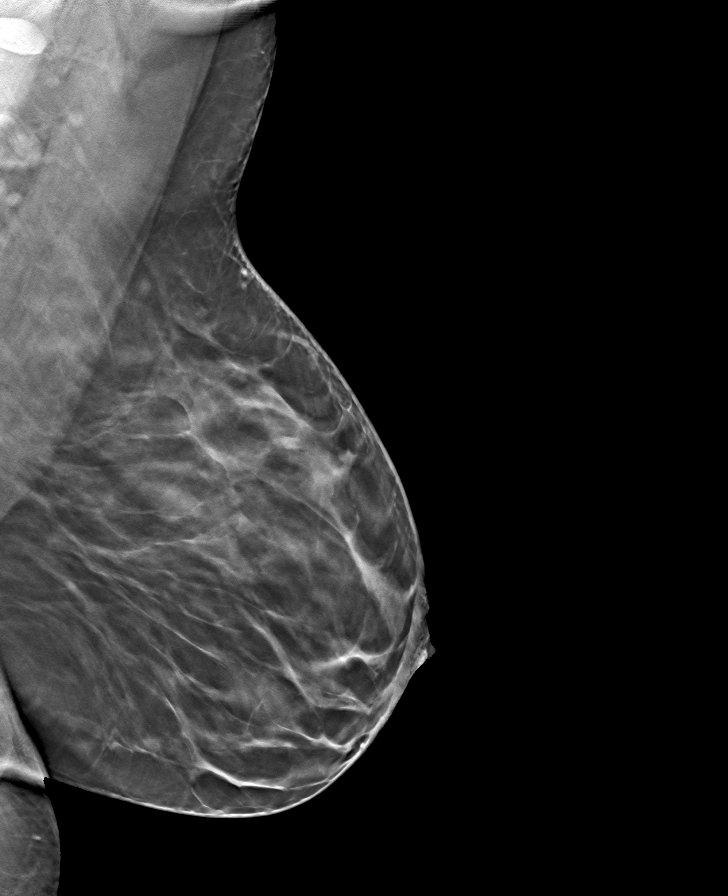

[R CC tomo · tomo slice 27/52.0]
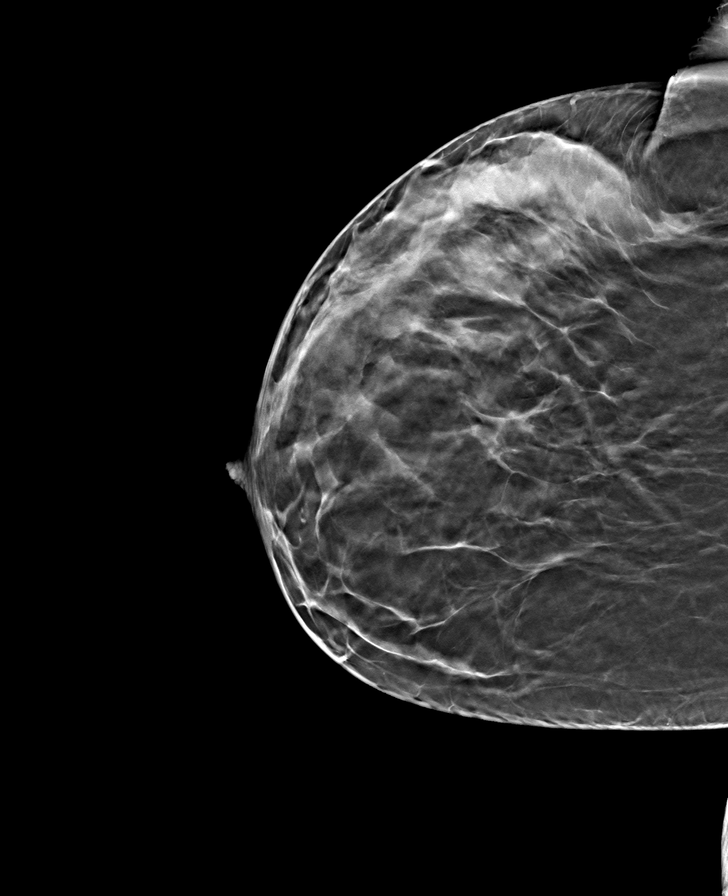

[8 of 24 positions shown; findings below may reference images not displayed]

ACR Breast Density Category c: The breast tissue is heterogeneously
dense, which may obscure small masses
FINDINGS: There are no findings suspicious for malignancy.
IMPRESSION: No mammographic evidence of malignancy. A result letter of this
screening mammogram will be mailed directly to the patient.

RECOMMENDATION:
Screening mammogram in one year. (Code:C8-T-HNK)

BI-RADS CATEGORY  1: Negative.

## 2023-06-13 ENCOUNTER — Other Ambulatory Visit: Payer: Self-pay

## 2023-06-13 ENCOUNTER — Encounter (HOSPITAL_BASED_OUTPATIENT_CLINIC_OR_DEPARTMENT_OTHER): Payer: Self-pay | Admitting: Orthopedic Surgery

## 2023-06-16 ENCOUNTER — Encounter (HOSPITAL_BASED_OUTPATIENT_CLINIC_OR_DEPARTMENT_OTHER)
Admission: RE | Admit: 2023-06-16 | Discharge: 2023-06-16 | Disposition: A | Discharge status: Home / Self Care | Payer: No Typology Code available for payment source | Source: Ambulatory Visit | Attending: Orthopedic Surgery | Admitting: Orthopedic Surgery

## 2023-06-16 DIAGNOSIS — I10 Essential (primary) hypertension: Secondary | ICD-10-CM | POA: Insufficient documentation

## 2023-06-16 DIAGNOSIS — Z01818 Encounter for other preprocedural examination: Secondary | ICD-10-CM | POA: Diagnosis not present

## 2023-06-16 LAB — BASIC METABOLIC PANEL
Anion gap: 12 (ref 5–15)
BUN: 11 mg/dL (ref 6–20)
CO2: 22 mmol/L (ref 22–32)
Calcium: 9.1 mg/dL (ref 8.9–10.3)
Chloride: 101 mmol/L (ref 98–111)
Creatinine, Ser: 0.84 mg/dL (ref 0.44–1.00)
GFR, Estimated: >60 mL/min (ref >60)
Glucose, Bld: 103 mg/dL — ABNORMAL HIGH (ref 70–99)
Potassium: 3.9 mmol/L (ref 3.5–5.1)
Sodium: 135 mmol/L (ref 135–145)

## 2023-06-16 IMAGING — DX DG CHEST 2V
2 series · 2 of 2 positions shown · non-contrast
Comparison: 05/06/2016

CLINICAL DATA: Cough and SOB.

EXAM:
CHEST - 2 VIEW

[chest pa]
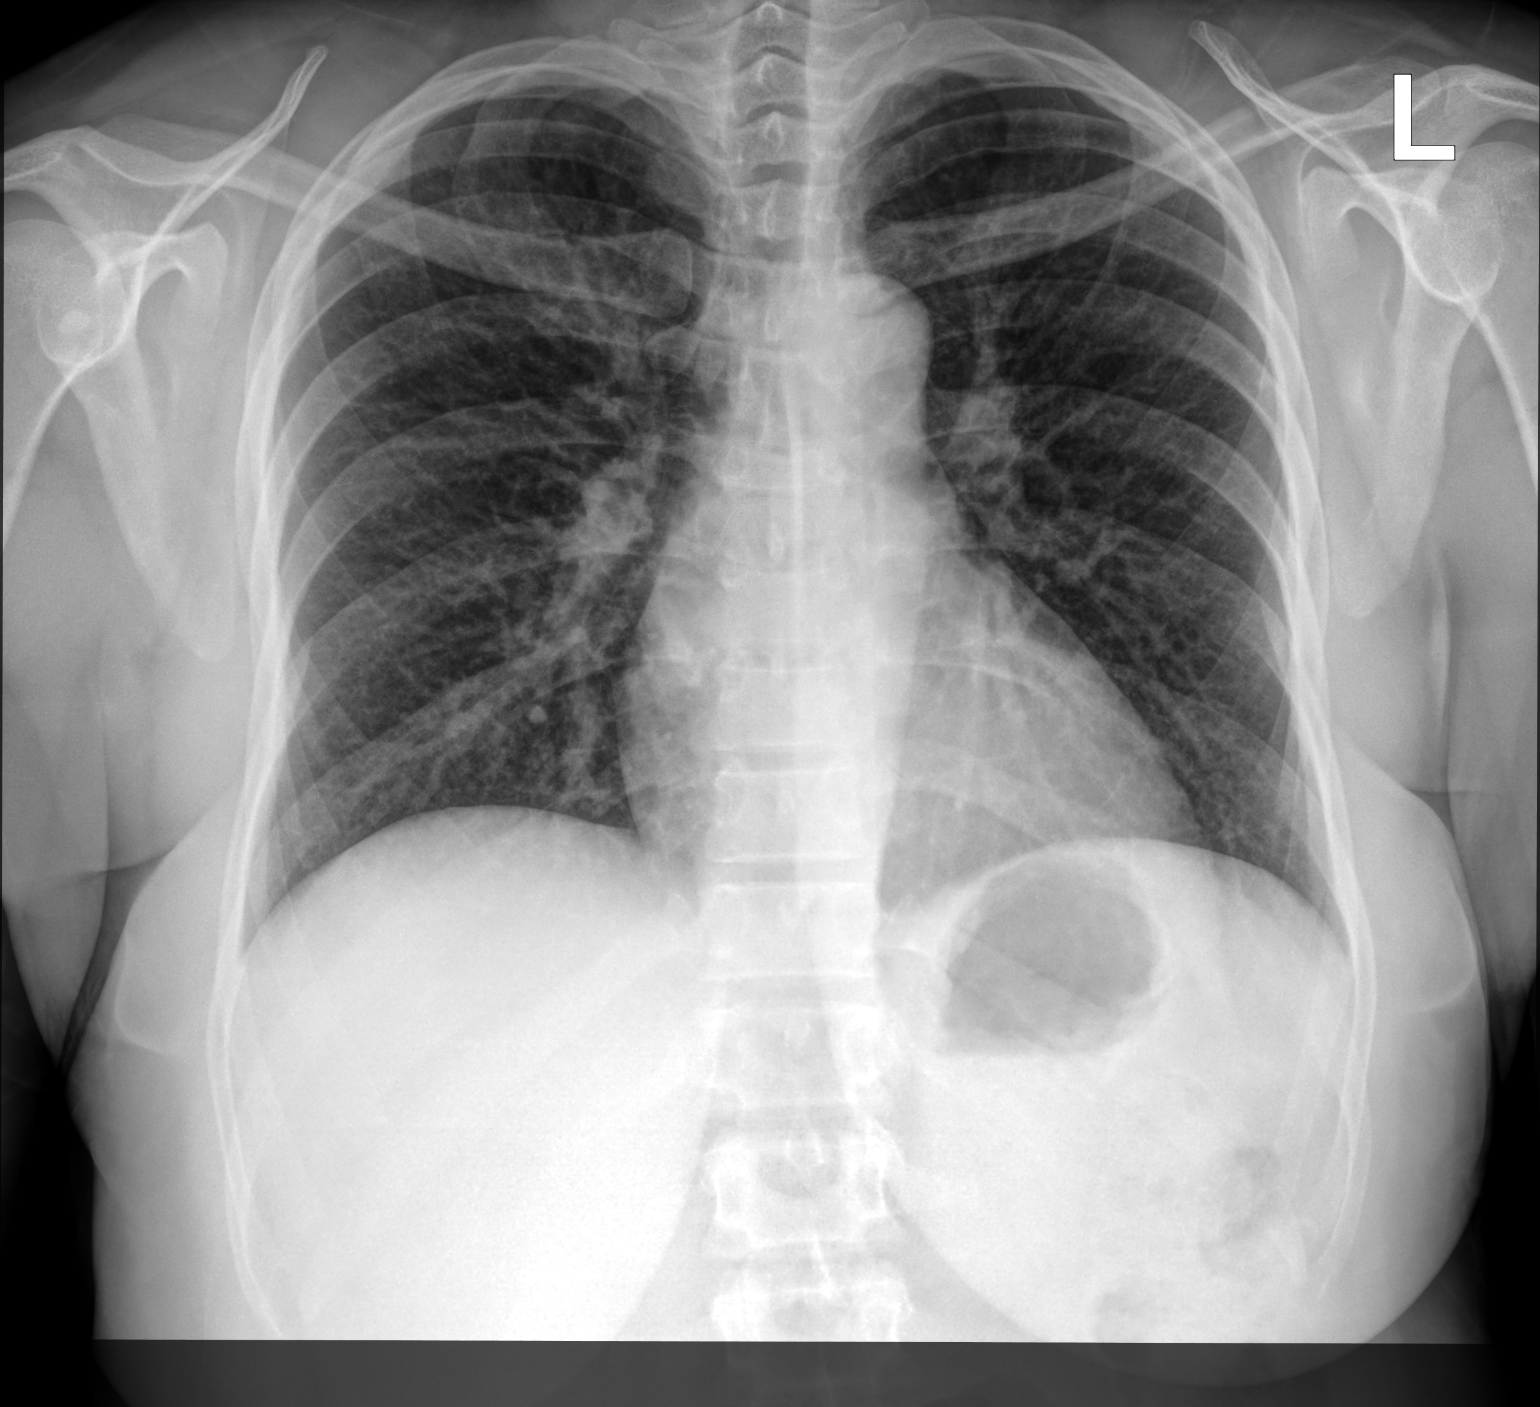

[chest lat]
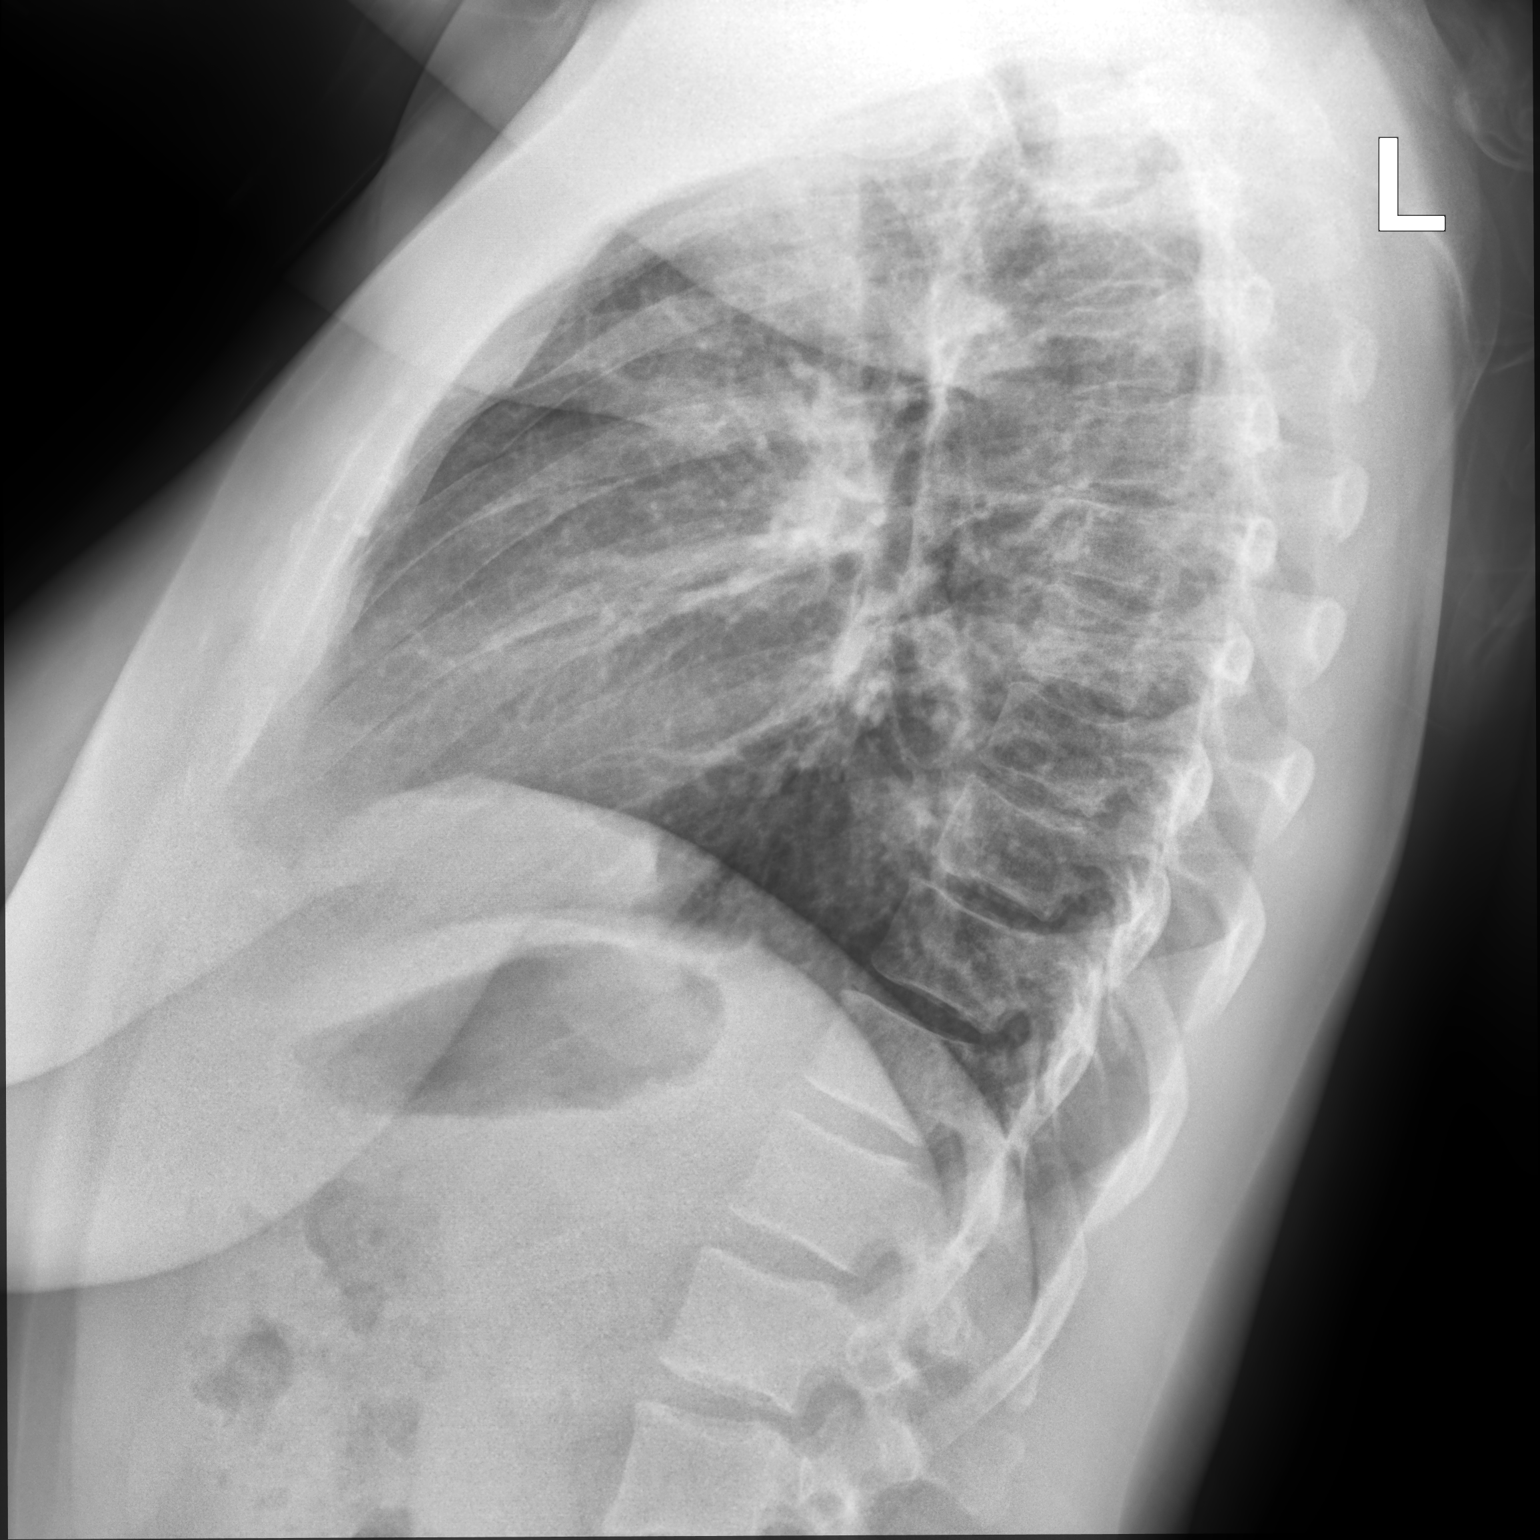

[2 of 2 positions shown; findings below may reference images not displayed]

FINDINGS: Normal heart size. No signs of pleural effusion or edema. No
airspace opacities identified. Visualized osseous structures are
unremarkable.
IMPRESSION: No active cardiopulmonary disease.

## 2023-06-16 NOTE — Progress Notes (Signed)

## 2023-06-18 ENCOUNTER — Other Ambulatory Visit (HOSPITAL_COMMUNITY): Payer: Self-pay | Admitting: Family Medicine

## 2023-06-18 DIAGNOSIS — Z1231 Encounter for screening mammogram for malignant neoplasm of breast: Secondary | ICD-10-CM

## 2023-06-20 ENCOUNTER — Encounter (HOSPITAL_BASED_OUTPATIENT_CLINIC_OR_DEPARTMENT_OTHER): Payer: Self-pay | Admitting: Orthopedic Surgery

## 2023-06-20 ENCOUNTER — Ambulatory Visit (HOSPITAL_BASED_OUTPATIENT_CLINIC_OR_DEPARTMENT_OTHER)
Admission: RE | Admit: 2023-06-20 | Discharge: 2023-06-20 | Disposition: A | Discharge status: Home / Self Care | Payer: No Typology Code available for payment source | Attending: Orthopedic Surgery | Admitting: Orthopedic Surgery

## 2023-06-20 ENCOUNTER — Ambulatory Visit (HOSPITAL_BASED_OUTPATIENT_CLINIC_OR_DEPARTMENT_OTHER): Payer: No Typology Code available for payment source | Admitting: Certified Registered Nurse Anesthetist

## 2023-06-20 ENCOUNTER — Other Ambulatory Visit: Payer: Self-pay

## 2023-06-20 ENCOUNTER — Encounter (HOSPITAL_BASED_OUTPATIENT_CLINIC_OR_DEPARTMENT_OTHER)
Admission: RE | Disposition: A | Discharge status: Home / Self Care | Payer: Self-pay | Source: Home / Self Care | Attending: Orthopedic Surgery

## 2023-06-20 ENCOUNTER — Ambulatory Visit (HOSPITAL_COMMUNITY): Payer: No Typology Code available for payment source

## 2023-06-20 DIAGNOSIS — Z01818 Encounter for other preprocedural examination: Secondary | ICD-10-CM

## 2023-06-20 DIAGNOSIS — M84361A Stress fracture, right tibia, initial encounter for fracture: Secondary | ICD-10-CM | POA: Insufficient documentation

## 2023-06-20 DIAGNOSIS — M659 Synovitis and tenosynovitis, unspecified: Secondary | ICD-10-CM | POA: Insufficient documentation

## 2023-06-20 DIAGNOSIS — M94261 Chondromalacia, right knee: Secondary | ICD-10-CM | POA: Insufficient documentation

## 2023-06-20 DIAGNOSIS — K219 Gastro-esophageal reflux disease without esophagitis: Secondary | ICD-10-CM | POA: Diagnosis not present

## 2023-06-20 DIAGNOSIS — X58XXXA Exposure to other specified factors, initial encounter: Secondary | ICD-10-CM | POA: Insufficient documentation

## 2023-06-20 DIAGNOSIS — Z87891 Personal history of nicotine dependence: Secondary | ICD-10-CM | POA: Diagnosis not present

## 2023-06-20 DIAGNOSIS — I10 Essential (primary) hypertension: Secondary | ICD-10-CM | POA: Diagnosis not present

## 2023-06-20 DIAGNOSIS — S83241A Other tear of medial meniscus, current injury, right knee, initial encounter: Secondary | ICD-10-CM | POA: Diagnosis not present

## 2023-06-20 DIAGNOSIS — M65861 Other synovitis and tenosynovitis, right lower leg: Secondary | ICD-10-CM | POA: Diagnosis not present

## 2023-06-20 HISTORY — PX: KNEE ARTHROSCOPY WITH SUBCHONDROPLASTY: SHX6732

## 2023-06-20 LAB — POCT PREGNANCY, URINE: Preg Test, Ur: NEGATIVE

## 2023-06-20 SURGERY — ARTHROSCOPY, KNEE, WITH SUBCHONDROPLASTY
Anesthesia: General | Site: Knee | Laterality: Right

## 2023-06-20 MED ORDER — LIDOCAINE 2% (20 MG/ML) 5 ML SYRINGE
INTRAMUSCULAR | Status: AC
  Filled 2023-06-20: qty 5

## 2023-06-20 MED ORDER — KETOROLAC TROMETHAMINE 30 MG/ML IJ SOLN
INTRAMUSCULAR | Status: DC | PRN
  Administered 2023-06-20: 30 mg via INTRAVENOUS

## 2023-06-20 MED ORDER — SODIUM CHLORIDE 0.9 % IR SOLN
Status: DC | PRN
  Administered 2023-06-20: 3000 mL

## 2023-06-20 MED ORDER — KETOROLAC TROMETHAMINE 30 MG/ML IJ SOLN
INTRAMUSCULAR | Status: AC
  Filled 2023-06-20: qty 1

## 2023-06-20 MED ORDER — OXYCODONE HCL 5 MG PO TABS
5.0000 mg | ORAL_TABLET | Freq: Once | ORAL | Status: AC | PRN
  Administered 2023-06-20: 5 mg via ORAL

## 2023-06-20 MED ORDER — PROPOFOL 10 MG/ML IV BOLUS
INTRAVENOUS | Status: AC
  Filled 2023-06-20: qty 20

## 2023-06-20 MED ORDER — KETAMINE HCL 50 MG/5ML IJ SOSY
PREFILLED_SYRINGE | INTRAMUSCULAR | Status: AC
  Filled 2023-06-20: qty 5

## 2023-06-20 MED ORDER — LIDOCAINE HCL (CARDIAC) PF 100 MG/5ML IV SOSY
PREFILLED_SYRINGE | INTRAVENOUS | Status: DC | PRN
  Administered 2023-06-20: 50 mg via INTRAVENOUS

## 2023-06-20 MED ORDER — OXYCODONE HCL 5 MG/5ML PO SOLN: 5.0000 mg | Freq: Once | ORAL | Status: AC | PRN

## 2023-06-20 MED ORDER — FENTANYL CITRATE (PF) 100 MCG/2ML IJ SOLN
INTRAMUSCULAR | Status: AC
  Filled 2023-06-20: qty 2

## 2023-06-20 MED ORDER — ONDANSETRON HCL 4 MG/2ML IJ SOLN
INTRAMUSCULAR | Status: AC
  Filled 2023-06-20: qty 2

## 2023-06-20 MED ORDER — DEXAMETHASONE SODIUM PHOSPHATE 4 MG/ML IJ SOLN
INTRAMUSCULAR | Status: DC | PRN
  Administered 2023-06-20: 8 mg via INTRAVENOUS

## 2023-06-20 MED ORDER — MIDAZOLAM HCL 2 MG/2ML IJ SOLN
INTRAMUSCULAR | Status: AC
  Filled 2023-06-20: qty 2

## 2023-06-20 MED ORDER — MIDAZOLAM HCL 5 MG/5ML IJ SOLN
INTRAMUSCULAR | Status: DC | PRN
  Administered 2023-06-20: 2 mg via INTRAVENOUS

## 2023-06-20 MED ORDER — CEFAZOLIN SODIUM-DEXTROSE 2-4 GM/100ML-% IV SOLN
2.0000 g | INTRAVENOUS | Status: AC
  Administered 2023-06-20: 2 g via INTRAVENOUS

## 2023-06-20 MED ORDER — ONDANSETRON HCL 4 MG/2ML IJ SOLN: 4.0000 mg | Freq: Four times a day (QID) | INTRAMUSCULAR | Status: DC | PRN

## 2023-06-20 MED ORDER — FENTANYL CITRATE (PF) 100 MCG/2ML IJ SOLN
25.0000 ug | INTRAMUSCULAR | Status: DC | PRN
  Administered 2023-06-20: 50 ug via INTRAVENOUS
  Administered 2023-06-20 (×2): 25 ug via INTRAVENOUS

## 2023-06-20 MED ORDER — OXYCODONE HCL 5 MG PO TABS
ORAL_TABLET | ORAL | Status: AC
  Filled 2023-06-20: qty 1

## 2023-06-20 MED ORDER — ONDANSETRON HCL 4 MG/2ML IJ SOLN
INTRAMUSCULAR | Status: DC | PRN
  Administered 2023-06-20: 4 mg via INTRAVENOUS

## 2023-06-20 MED ORDER — PROPOFOL 10 MG/ML IV BOLUS
INTRAVENOUS | Status: DC | PRN
  Administered 2023-06-20: 200 mg via INTRAVENOUS

## 2023-06-20 MED ORDER — GLYCOPYRROLATE 0.2 MG/ML IJ SOLN
INTRAMUSCULAR | Status: DC | PRN
  Administered 2023-06-20: .2 mg via INTRAVENOUS

## 2023-06-20 MED ORDER — KETAMINE HCL 10 MG/ML IJ SOLN
INTRAMUSCULAR | Status: DC | PRN
  Administered 2023-06-20: 30 mg via INTRAVENOUS

## 2023-06-20 MED ORDER — ONDANSETRON HCL 4 MG PO TABS: 4.0000 mg | ORAL_TABLET | Freq: Three times a day (TID) | ORAL | 0 refills | Status: DC | PRN

## 2023-06-20 MED ORDER — CEFAZOLIN SODIUM-DEXTROSE 2-4 GM/100ML-% IV SOLN
INTRAVENOUS | Status: AC
  Filled 2023-06-20: qty 100

## 2023-06-20 MED ORDER — BUPIVACAINE HCL (PF) 0.25 % IJ SOLN
INTRAMUSCULAR | Status: AC
  Filled 2023-06-20: qty 30

## 2023-06-20 MED ORDER — FENTANYL CITRATE (PF) 100 MCG/2ML IJ SOLN
INTRAMUSCULAR | Status: DC | PRN
  Administered 2023-06-20 (×4): 50 ug via INTRAVENOUS

## 2023-06-20 MED ORDER — GLYCOPYRROLATE PF 0.2 MG/ML IJ SOSY
PREFILLED_SYRINGE | INTRAMUSCULAR | Status: AC
  Filled 2023-06-20: qty 1

## 2023-06-20 MED ORDER — ACETAMINOPHEN 10 MG/ML IV SOLN
INTRAVENOUS | Status: DC | PRN
  Administered 2023-06-20: 1000 mg via INTRAVENOUS

## 2023-06-20 MED ORDER — DEXAMETHASONE SODIUM PHOSPHATE 10 MG/ML IJ SOLN
INTRAMUSCULAR | Status: AC
  Filled 2023-06-20: qty 1

## 2023-06-20 MED ORDER — OXYCODONE HCL 5 MG PO TABS
5.0000 mg | ORAL_TABLET | ORAL | 0 refills | Status: DC | PRN
Start: 2023-06-20 — End: 2023-10-03

## 2023-06-20 MED ORDER — BUPIVACAINE HCL 0.25 % IJ SOLN
INTRAMUSCULAR | Status: DC | PRN
  Administered 2023-06-20: 20 mL

## 2023-06-20 MED ORDER — ACETAMINOPHEN 10 MG/ML IV SOLN
INTRAVENOUS | Status: AC
  Filled 2023-06-20: qty 100

## 2023-06-20 MED ORDER — LACTATED RINGERS IV SOLN: INTRAVENOUS | Status: DC

## 2023-06-20 SURGICAL SUPPLY — 42 items
BANDAGE ESMARK 6X9 LF (GAUZE/BANDAGES/DRESSINGS) IMPLANT
BLADE SHAVER TORPEDO 4X13 (MISCELLANEOUS) ×1 IMPLANT
BNDG CMPR 6 X 5 YARDS HK CLSR (GAUZE/BANDAGES/DRESSINGS) ×1
BNDG CMPR 9X6 STRL LF SNTH (GAUZE/BANDAGES/DRESSINGS)
BNDG ELASTIC 6INX 5YD STR LF (GAUZE/BANDAGES/DRESSINGS) ×1 IMPLANT
BNDG ESMARK 6X9 LF (GAUZE/BANDAGES/DRESSINGS)
BURR OVAL 8 FLU 4.0X13 (MISCELLANEOUS) IMPLANT
BURR OVAL 8 FLU 5.0X13 (MISCELLANEOUS) IMPLANT
CLSR STERI-STRIP ANTIMIC 1/2X4 (GAUZE/BANDAGES/DRESSINGS) ×1 IMPLANT
COVER MAYO STAND STRL (DRAPES) ×1 IMPLANT
CUFF TOURN SGL QUICK 34 (TOURNIQUET CUFF)
CUFF TRNQT CYL 34X4.125X (TOURNIQUET CUFF) IMPLANT
CUTTER BONE 4.0MM X 13CM (MISCELLANEOUS) IMPLANT
DRAPE C-ARM 35X43 STRL (DRAPES) ×1 IMPLANT
DRAPE C-ARMOR (DRAPES) ×1 IMPLANT
DRAPE INCISE IOBAN 66X45 STRL (DRAPES) IMPLANT
DRAPE U-SHAPE 47X51 STRL (DRAPES) ×1 IMPLANT
DRAPE-T ARTHROSCOPY W/POUCH (DRAPES) ×1 IMPLANT
DURAPREP 26ML APPLICATOR (WOUND CARE) ×1 IMPLANT
ELECT REM PT RETURN 9FT ADLT (ELECTROSURGICAL) ×1
ELECTRODE REM PT RTRN 9FT ADLT (ELECTROSURGICAL) IMPLANT
GAUZE PAD ABD 8X10 STRL (GAUZE/BANDAGES/DRESSINGS) ×1 IMPLANT
GAUZE SPONGE 4X4 12PLY STRL (GAUZE/BANDAGES/DRESSINGS) ×1 IMPLANT
GAUZE XEROFORM 1X8 LF (GAUZE/BANDAGES/DRESSINGS) ×1 IMPLANT
GLOVE BIO SURGEON STRL SZ7.5 (GLOVE) ×2 IMPLANT
GLOVE BIOGEL PI IND STRL 8 (GLOVE) ×2 IMPLANT
GOWN STRL REUS W/ TWL LRG LVL3 (GOWN DISPOSABLE) ×1 IMPLANT
GOWN STRL REUS W/ TWL XL LVL3 (GOWN DISPOSABLE) ×1 IMPLANT
GOWN STRL REUS W/TWL LRG LVL3 (GOWN DISPOSABLE) ×2
GOWN STRL REUS W/TWL XL LVL3 (GOWN DISPOSABLE) ×3 IMPLANT
GRAFT FILLER BONE 5ML (Knees) IMPLANT
KIT KNEE SCP 414.502 (Knees) ×1 IMPLANT
MANIFOLD NEPTUNE II (INSTRUMENTS) ×1 IMPLANT
PACK ARTHROSCOPY DSU (CUSTOM PROCEDURE TRAY) ×1 IMPLANT
PACK BASIN DAY SURGERY FS (CUSTOM PROCEDURE TRAY) ×1 IMPLANT
PORT APPOLLO RF 90DEGREE MULTI (SURGICAL WAND) IMPLANT
SHEET MEDIUM DRAPE 40X70 STRL (DRAPES) ×1 IMPLANT
SUT ETHILON 4 0 PS 2 18 (SUTURE) ×1 IMPLANT
SUT MNCRL AB 3-0 PS2 18 (SUTURE) ×1 IMPLANT
TOWEL GREEN STERILE FF (TOWEL DISPOSABLE) ×1 IMPLANT
TUBE CONNECTING 20X1/4 (TUBING) IMPLANT
TUBING ARTHROSCOPY IRRIG 16FT (MISCELLANEOUS) ×1 IMPLANT

## 2023-06-20 NOTE — Brief Op Note (Signed)
06/20/2023  12:18 PM  PATIENT:  Miranda Wade  42 y.o. female  PRE-OPERATIVE DIAGNOSIS:  Right knee medial meniscus tear, medial tibia stress fracture  POST-OPERATIVE DIAGNOSIS:  1.  Right knee synovitis 2.  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  Right knee medial tibial plateau stress fracture  PROCEDURE:  Procedure(s): Right KNEE ARTHROSCOPY, chondroplasty of medial femoral condyle, percutaneous fixation of medial tibial plateau fracture WITH MEDIAL TIBIA SUBCHONDROPLASTY (Right)  SURGEON:  Surgeons and Role:    * Yolonda Kida, MD - Primary  PHYSICIAN ASSISTANT: Dion Saucier, PA-C   ANESTHESIA:   local and general  EBL:  20 mL   BLOOD ADMINISTERED:none  DRAINS: none   LOCAL MEDICATIONS USED:  MARCAINE     SPECIMEN:  No Specimen  DISPOSITION OF SPECIMEN:  N/A  COUNTS:  YES  TOURNIQUET:  * No tourniquets in log *  DICTATION: .Note written in EPIC  PLAN OF CARE: Discharge to home after PACU  PATIENT DISPOSITION:  PACU - hemodynamically stable.   Delay start of Pharmacological VTE agent (>24hrs) due to surgical blood loss or risk of bleeding: not applicable

## 2023-06-20 NOTE — Transfer of Care (Signed)
Immediate Anesthesia Transfer of Care Note  Patient: Miranda Wade  Procedure(s) Performed: KNEE ARTHROSCOPY, PARTIAL MEDIAL MENISCECTOMY WITH MEDIAL TIBIA SUBCHONDROPLASTY (Right: Knee)  Patient Location: PACU  Anesthesia Type:General  Level of Consciousness: awake, alert , and oriented  Airway & Oxygen Therapy: Patient Spontanous Breathing and Patient connected to face mask oxygen  Post-op Assessment: Report given to RN and Post -op Vital signs reviewed and stable  Post vital signs: Reviewed and stable  Last Vitals:  Vitals Value Taken Time  BP 151/98 06/20/23 1223  Temp    Pulse 88 06/20/23 1225  Resp 13 06/20/23 1225  SpO2 100 % 06/20/23 1225  Vitals shown include unfiled device data.  Last Pain:  Vitals:   06/20/23 1011  TempSrc: Oral  PainSc: 0-No pain      Patients Stated Pain Goal: 4 (06/20/23 1011)  Complications: No notable events documented.

## 2023-06-20 NOTE — Anesthesia Preprocedure Evaluation (Signed)
Anesthesia Evaluation  Patient identified by MRN, date of birth, ID band Patient awake    Reviewed: Allergy & Precautions, H&P , NPO status , Patient's Chart, lab work & pertinent test results  Airway Mallampati: II   Neck ROM: full    Dental   Pulmonary former smoker   breath sounds clear to auscultation       Cardiovascular hypertension,  Rhythm:regular Rate:Normal     Neuro/Psych    GI/Hepatic ,GERD  ,,  Endo/Other  obese  Renal/GU      Musculoskeletal   Abdominal   Peds  Hematology   Anesthesia Other Findings   Reproductive/Obstetrics                             Anesthesia Physical Anesthesia Plan  ASA: 2  Anesthesia Plan: General   Post-op Pain Management:    Induction: Intravenous  PONV Risk Score and Plan: 3 and Ondansetron, Dexamethasone, Midazolam and Treatment may vary due to age or medical condition  Airway Management Planned: LMA  Additional Equipment:   Intra-op Plan:   Post-operative Plan: Extubation in OR  Informed Consent: I have reviewed the patients History and Physical, chart, labs and discussed the procedure including the risks, benefits and alternatives for the proposed anesthesia with the patient or authorized representative who has indicated his/her understanding and acceptance.     Dental advisory given  Plan Discussed with: CRNA, Anesthesiologist and Surgeon  Anesthesia Plan Comments:        Anesthesia Quick Evaluation

## 2023-06-20 NOTE — Discharge Instructions (Addendum)
May take Tylenol after 6pm, if needed.  May take NSAIDS (ibuprofen/motrin) after 6pm, if needed.   Post Anesthesia Home Care Instructions  Activity: Get plenty of rest for the remainder of the day. A responsible individual must stay with you for 24 hours following the procedure.  For the next 24 hours, DO NOT: -Drive a car -Advertising copywriter -Drink alcoholic beverages -Take any medication unless instructed by your physician -Make any legal decisions or sign important papers.  Meals: Start with liquid foods such as gelatin or soup. Progress to regular foods as tolerated. Avoid greasy, spicy, heavy foods. If nausea and/or vomiting occur, drink only clear liquids until the nausea and/or vomiting subsides. Call your physician if vomiting continues.  Special Instructions/Symptoms: Your throat may feel dry or sore from the anesthesia or the breathing tube placed in your throat during surgery. If this causes discomfort, gargle with warm salt water. The discomfort should disappear within 24 hours.  If you had a scopolamine patch placed behind your ear for the management of post- operative nausea and/or vomiting:  1. The medication in the patch is effective for 72 hours, after which it should be removed.  Wrap patch in a tissue and discard in the trash. Wash hands thoroughly with soap and water. 2. You may remove the patch earlier than 72 hours if you experience unpleasant side effects which may include dry mouth, dizziness or visual disturbances. 3. Avoid touching the patch. Wash your hands with soap and water after contact with the patch.

## 2023-06-20 NOTE — H&P (Signed)
ORTHOPAEDIC H and P  REQUESTING PHYSICIAN: Yolonda Kida, MD  PCP:  Tommie Sams, DO  Chief Complaint: Right knee pain  HPI: Miranda Wade is a 42 y.o. female who complains of right knee pain and mechanical symptoms that have been recalcitrant to conservative treatment.  She is here today for arthroscopic partial medial meniscectomy as well as subchondroplasty to treat in a percutaneous fashion stress fracture.  No new complaints.  Past Medical History:  Diagnosis Date   Anemia    Essential hypertension    Hyperlipidemia    Seasonal allergies    Past Surgical History:  Procedure Laterality Date   BIOPSY  04/29/2022   Procedure: BIOPSY;  Surgeon: Lanelle Bal, DO;  Location: [facility];  Service: Endoscopy;;   COLONOSCOPY WITH PROPOFOL N/A 04/29/2022   Procedure: COLONOSCOPY WITH PROPOFOL;  Surgeon: Lanelle Bal, DO;  Location: [facility];  Service: Endoscopy;  Laterality: N/A;  2:00pm   ESOPHAGOGASTRODUODENOSCOPY (EGD) WITH PROPOFOL N/A 04/29/2022   Procedure: ESOPHAGOGASTRODUODENOSCOPY (EGD) WITH PROPOFOL;  Surgeon: Lanelle Bal, DO;  Location: [facility];  Service: Endoscopy;  Laterality: N/A;   LAPAROSCOPIC BILATERAL SALPINGECTOMY Bilateral 05/01/2022   Procedure: LAPAROSCOPIC BILATERAL SALPINGECTOMY;  Surgeon: Lazaro Arms, MD;  Location: [facility];  Service: Gynecology;  Laterality: Bilateral;   MASS EXCISION Right 08/04/2017   Procedure: EXCISION OF RIGHT NASAL  MASS;  Surgeon: Newman Pies, MD;  Location: [facility];  Service: ENT;  Laterality: Right;  LOCAL   POLYPECTOMY  04/29/2022   Procedure: POLYPECTOMY;  Surgeon: Lanelle Bal, DO;  Location: [facility];  Service: Endoscopy;;   REMOVAL OF NON VAGINAL CONTRACEPTIVE DEVICE N/A 05/01/2022   Procedure: REMOVAL OF NON VAGINAL CONTRACEPTIVE DEVICE;  Surgeon: Lazaro Arms, MD;  Location: [facility];  Service: Gynecology;  Laterality: N/A;   WRIST SURGERY     Social History    Socioeconomic History   Marital status: Married    Spouse name: Barbara Cower   Number of children: 2   Years of education: 14   Highest education level: GED or equivalent  Occupational History   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  Tobacco Use   Smoking status: Former    Current packs/day: 0.50    Types: Cigarettes    Passive exposure: Current   Smokeless tobacco: Never  Vaping Use   Vaping status: Never Used  Substance and Sexual Activity   Alcohol use: No   Drug use: No   Sexual activity: Yes    Birth control/protection: Surgical    Comment: tubal  Other Topics Concern   Not on file  Social History Narrative   Lives daughter Howell Rucks is in college, home some weekends   Currently separated from husband   Social Determinants of Health   Financial Resource Strain: Low Risk  (09/03/2022)   Overall Financial Resource Strain (CARDIA)    Difficulty of Paying Living Expenses: Not very hard  Food Insecurity: No Food Insecurity (04/17/2023)   Hunger Vital Sign    Worried About Running Out of Food in the Last Year: Never true    Ran Out of Food in the Last Year: Never true  Transportation Needs: No Transportation Needs (04/17/2023)   PRAPARE - Administrator, Civil Service (Medical): No    Lack of Transportation (Non-Medical): No  Physical Activity: Insufficiently Active (04/17/2023)   Exercise Vital Sign    Days of Exercise per  Week: 3 days    Minutes of Exercise per Session: 30 min  Stress: No Stress Concern Present (09/03/2022)   [employer] of Occupational Health - Occupational Stress Questionnaire    Feeling of Stress : Not at all  Social Connections: Moderately Integrated (04/17/2023)   Social Connection and Isolation Panel [NHANES]    Frequency of Communication with Friends and Family: More than three times a week    Frequency of Social Gatherings with Friends and Family: Once a week    Attends Religious Services: 1 to 4 times per year     Active Member of [employer] or Organizations: No    Attends Engineer, structural: Never    Marital Status: Married   Family History  Problem Relation Age of Onset   Diabetes Mother    Hypertension Mother    Arthritis Mother    Hyperlipidemia Mother    Stroke Mother    Hypertension Father    Heart disease Father 65       Valve replacement   Hyperlipidemia Father    Asthma Daughter    Hypertension Brother    Stroke Maternal Grandmother    Allergies Daughter    No Known Allergies Prior to Admission medications   Medication Sig Start Date End Date Taking? Authorizing Provider  Ascorbic Acid (VITAMIN C PO) Take 1 tablet by mouth daily.   Yes [provider]  atorvastatin (LIPITOR) 80 MG tablet Take 1 tablet (80 mg total) by mouth daily. 01/02/23  Yes Cook, Jayce G, DO  ferrous sulfate 325 (65 FE) MG tablet Take 325 mg by mouth daily with breakfast.   Yes [provider]  hydrochlorothiazide (HYDRODIURIL) 25 MG tablet Take 1 tablet (25 mg total) by mouth daily. 04/18/23  Yes Cook, Jayce G, DO  ibuprofen (ADVIL) 600 MG tablet Take 600 mg by mouth every 6 (six) hours as needed for moderate pain.   Yes [provider]  losartan (COZAAR) 100 MG tablet Take 1 tablet (100 mg total) by mouth daily. 01/02/23  Yes Cook, Jayce G, DO  Omega-3 Fatty Acids (FISH OIL PO) Take 1 capsule by mouth daily.   Yes [provider]  pantoprazole (PROTONIX) 40 MG tablet Take 1 tablet (40 mg total) by mouth daily. 01/02/23  Yes Cook, Jayce G, DO  fluticasone (FLONASE) 50 MCG/ACT nasal spray Place 2 sprays into both nostrils daily. 10/08/22   Leath-Warren, Sadie Haber, NP  ketoconazole (NIZORAL) 2 % cream Apply TOPICALLY once daily for 2 weeks 04/09/23   Mardella Layman, MD   No results found.  Positive ROS: All other systems have been reviewed and were otherwise negative with the exception of those mentioned in the HPI and as above.  Physical Exam: General: Alert, no acute  distress Cardiovascular: No pedal edema Respiratory: No cyanosis, no use of accessory musculature GI: No organomegaly, abdomen is soft and non-tender Skin: No lesions in the area of chief complaint Neurologic: Sensation intact distally Psychiatric: Patient is competent for consent with normal mood and affect Lymphatic: No axillary or cervical lymphadenopathy  MUSCULOSKELETAL: Right lower extremity is warm and well-perfused with no open wounds or lesions.  Neurovascular intact.  Assessment: 1.  Acute right medial meniscus tear complex.  2.  Right knee stress fracture medial tibial plateau  Plan: Plan to proceed today with arthroscopic assisted partial medial meniscectomy as well as percutaneous fixation of medial tibial plateau stress fracture.  No new complaints at this time.  All questions solicited and answered to  her satisfaction.  We discussed the risk of bleeding, infection, damage to surrounding nerves and vessels, stiffness, possible need for revision surgery in the future as well as the risk of anesthesia and DVT.  She has provided informed consent.  Plan for discharge home postoperatively from PACU.    Yolonda Kida, MD Cell [phone]    06/20/2023 10:07 AM

## 2023-06-20 NOTE — Op Note (Signed)
Surgeon(s): Yolonda Kida, MD  Assistant:  Dion Saucier, PA-C  Assistant attestation:  PA McClung present for the entire procedure.   ANESTHESIA:  general, and regional   FLUIDS: Per anesthesia record.    ESTIMATED BLOOD LOSS: minimal     PREOPERATIVE DIAGNOSES:  1.  Right knee medial meniscus tear 2.  Right knee insufficiency fracture medial tibial plateau 3.   Right medial femoral condyle and medial plateau chondromalacia   POSTOPERATIVE DIAGNOSES:  1.  Right knee insufficiency fracture medial tibial plateau 2.   Right medial femoral condyle and medial plateau chondromalacia   PROCEDURES PERFORMED:  1. Right knee arthroscopically aided treatment of medial tibial plateau insufficiency fracture with percutaneous internal fixation (subchondroplasty)  2. Right knee arthroscopy with chondroplasty of medial femoral condyle   Implant: Flowable calcium phosphate, 5 mL. Zimmer   DESCRIPTION OF PROCEDURE: The patient has a right knee medial meniscus tear. They have had pain that has been refractory to conservative management. Their preoperative MRI demonstrated subchondral bone marrow edema and insufficiency fractures of the medial tibial plateau as well as the medial meniscus tear. Plans are to proceed with partial medial meniscectomy, internal fixation of subchondral insufficiency fractures with flowable calcium phosphate, and diagnostic arthroscopy with debridement as indicated. Full discussion held regarding risks benefits alternatives and complications related surgical intervention. Conservative care options reviewed. All questions answered.   The patient was identified in the preoperative holding area and the operative extremity was marked. The patient was brought to the operating room and transferred to operating table in a supine position. Satisfactory general anesthesia was induced by anesthesiology.     Standard anterolateral, anteromedial arthroscopy portals were  obtained. The anteromedial portal was obtained with a spinal needle for localization under direct visualization with subsequent diagnostic findings.    Anteromedial and anterolateral chambers: moderate synovitis. The synovitis was debrided with a 4.5 mm full radius shaver through both the anteromedial and lateral portals.    Suprapatellar pouch and gutters: Moderate synovitis or debris. Patella chondral surface: Grade 1 Trochlear chondral surface: Grade 3 Patellofemoral tracking: level Medial meniscus: No obvious tear on probing or inspection of the posterior, mid body or anterior horn.   Medial femoral condyle flexion bearing surface: Grade 3 Medial femoral condyle extension bearing surface: Grade 2 Medial tibial plateau: Grade 1, but a small focal area of grade 4 change right at the level of the stress fracture.  No propagation through the joint. Anterior cruciate ligament:stable Posterior cruciate ligament:stable Lateral meniscus: no tear.   Lateral femoral condyle flexion bearing surface: Grade 1 Lateral femoral condyle extension bearing surface: Grade 0 Lateral tibial plateau: Grade 1    Chondroplasty was achieved on the medial femoral condyle using a motorized shaver to debride the grade 3 unstable cartilage. Completion of the chondroplasty left A medial femoral condyle with smooth stable surface. There was no full-thickness component noted.   Next we turned our attention to the internal fixation of the medial tibial condyle. Arthroscopically we evaluated medial tibial condyle noted there was no loose cartilage or debris surrounding the lesion and the fracture did not propagate to the joint surface. Using preoperative MRI we targeted the delivery device to just under the subchondral density and in the stress fracture. This was achieved with intraoperative fluoroscopy. Once accurate placement was noted on 2 views and confirmed we delivered 5 mL of flowable calcium phosphate into the medial  tibial plateau stress fracture. We left the cannulas in place for approximately 8 minutes while  the implant hardened. We removed the cannulas and again took 2 views of fluoroscopic pictures to confirm there was no extravasation outside of the bone. There was none noted.   After completion of synovectomy, diagnostic exam, and debridements as described, all compartments were checked and no residual debris remained. Hemostasis was achieved with the cautery wand. The portals were approximated with nylon suture. All excess fluid was expressed from the joint.  Xeroform sterile gauze dressings were applied followed by Ace bandage and ice pack.    There were no immediate competitions and all counts were correct.   DISPOSITION: The patient was awakened from general anesthetic, extubated, taken to the recovery room in medically stable condition, no apparent complications. The patient may be weightbearing as tolerated to the operative lower extremity with crutches.  Range of motion of right knee as tolerated.  They will use bid asa for DVT ppx x 6 weeks, and return in 2 weeks for suture removal.   Yolonda Kida

## 2023-06-20 NOTE — Anesthesia Procedure Notes (Signed)
Procedure Name: LMA Insertion Date/Time: 06/20/2023 11:38 AM  Performed by: Cleda Clarks, CRNAPre-anesthesia Checklist: Patient identified, Emergency Drugs available, Suction available and Patient being monitored Patient Re-evaluated:Patient Re-evaluated prior to induction Oxygen Delivery Method: Circle system utilized Preoxygenation: Pre-oxygenation with 100% oxygen Induction Type: IV induction Ventilation: Mask ventilation without difficulty LMA: LMA inserted LMA Size: 4.0 Number of attempts: 1 Placement Confirmation: positive ETCO2 Tube secured with: Tape Dental Injury: Teeth and Oropharynx as per pre-operative assessment

## 2023-06-21 NOTE — Anesthesia Postprocedure Evaluation (Signed)
Anesthesia Post Note  Patient: Miranda Wade  Procedure(s) Performed: KNEE ARTHROSCOPY, PARTIAL MEDIAL MENISCECTOMY WITH MEDIAL TIBIA SUBCHONDROPLASTY (Right: Knee)     Patient location during evaluation: PACU Anesthesia Type: General Level of consciousness: awake and alert Pain management: pain level controlled Vital Signs Assessment: post-procedure vital signs reviewed and stable Respiratory status: spontaneous breathing, nonlabored ventilation, respiratory function stable and patient connected to nasal cannula oxygen Cardiovascular status: blood pressure returned to baseline and stable Postop Assessment: no apparent nausea or vomiting Anesthetic complications: no   No notable events documented.  Last Vitals:  Vitals:   06/20/23 1300 06/20/23 1312  BP:  138/81  Pulse: 67 65  Resp: 18 18  Temp:  (!) 36.3 C  SpO2: 100% 98%    Last Pain:  Vitals:   06/20/23 1312  TempSrc:   PainSc: 2                  Erik Burkett S

## 2023-06-24 ENCOUNTER — Encounter (HOSPITAL_BASED_OUTPATIENT_CLINIC_OR_DEPARTMENT_OTHER): Payer: Self-pay | Admitting: Orthopedic Surgery

## 2023-06-27 DIAGNOSIS — M25661 Stiffness of right knee, not elsewhere classified: Secondary | ICD-10-CM | POA: Diagnosis not present

## 2023-06-27 DIAGNOSIS — M25561 Pain in right knee: Secondary | ICD-10-CM | POA: Diagnosis not present

## 2023-06-30 DIAGNOSIS — M25661 Stiffness of right knee, not elsewhere classified: Secondary | ICD-10-CM | POA: Diagnosis not present

## 2023-06-30 DIAGNOSIS — M25561 Pain in right knee: Secondary | ICD-10-CM | POA: Diagnosis not present

## 2023-07-03 DIAGNOSIS — M25561 Pain in right knee: Secondary | ICD-10-CM | POA: Diagnosis not present

## 2023-07-03 DIAGNOSIS — M25661 Stiffness of right knee, not elsewhere classified: Secondary | ICD-10-CM | POA: Diagnosis not present

## 2023-07-05 ENCOUNTER — Ambulatory Visit
Admission: EM | Admit: 2023-07-05 | Discharge: 2023-07-05 | Disposition: A | Discharge status: Home / Self Care | Payer: No Typology Code available for payment source | Attending: Nurse Practitioner | Admitting: Nurse Practitioner

## 2023-07-05 DIAGNOSIS — B372 Candidiasis of skin and nail: Secondary | ICD-10-CM | POA: Diagnosis not present

## 2023-07-05 MED ORDER — FLUCONAZOLE 150 MG PO TABS: ORAL_TABLET | ORAL | 0 refills | Status: DC

## 2023-07-05 MED ORDER — NYSTATIN-TRIAMCINOLONE 100000-0.1 UNIT/GM-% EX CREA: TOPICAL_CREAM | CUTANEOUS | 0 refills | Status: DC

## 2023-07-05 MED ORDER — CEPHALEXIN 500 MG PO CAPS: 500.0000 mg | ORAL_CAPSULE | Freq: Four times a day (QID) | ORAL | 0 refills | Status: AC

## 2023-07-05 NOTE — Discharge Instructions (Signed)
Cytology swab is pending.  Results will be available within the next 24 to 72 hours.  If your results are abnormal, you will be contacted. Take medication as prescribed. Increase fluids and allow for plenty of rest. Continue to keep the areas clean and dry. Make sure you are wearing loosefitting clothing and cotton underwear. Make sure you dry thoroughly after showering. Recommend wearing dry fit bras. If symptoms do not improve with this treatment, please follow-up with your primary care physician for further evaluation. Follow-up as needed.

## 2023-07-05 NOTE — ED Triage Notes (Signed)
Pt reports she feels like she has a yeast infection x 5 days. Reports itching and discharge and feels like it is spreading to her butt.

## 2023-07-05 NOTE — ED Provider Notes (Signed)
[facility]    CSN: [id number] Arrival date & time: 07/05/23  0801      History   Chief Complaint No chief complaint on file.   HPI Miranda Wade is a 42 y.o. female.   The history is provided by the patient.   Patient presents for complaints of vaginal itching, itching in her groin, under the breast, and around her rectum.  She states symptoms have been present for the past week.  Patient reports that she has also noticed a white film in her groin area.  Patient denies vaginal discharge, vaginal odor, exposure to new soaps, medications, lotions, or detergents.  Patient reports that she has seen her gynecologist for the same or similar symptoms.  She states that he did give her a cream, but she does not recall the name.  She states that it is not working well for her.  Patient reports that she does sweat easily.  She denies any recent antibiotic use.  Patient reports history of the same or similar symptoms.  Past Medical History:  Diagnosis Date   Anemia    Essential hypertension    Hyperlipidemia    Seasonal allergies     Patient Active Problem List   Diagnosis Date Noted   Diarrhea 05/25/2023   GERD (gastroesophageal reflux disease) 01/02/2023   LVH (left ventricular hypertrophy) 07/21/2017   Essential hypertension, benign 04/30/2016   Morbid obesity (HCC) 04/30/2016   Hyperlipidemia 04/30/2016    Past Surgical History:  Procedure Laterality Date   BIOPSY  04/29/2022   Procedure: BIOPSY;  Surgeon: Lanelle Bal, DO;  [facility];  Service: Endoscopy;;   COLONOSCOPY WITH PROPOFOL N/A 04/29/2022   Procedure: COLONOSCOPY WITH PROPOFOL;  Surgeon: Lanelle Bal, DO;  [facility];  Service: Endoscopy;  Laterality: N/A;  2:00pm   ESOPHAGOGASTRODUODENOSCOPY (EGD) WITH PROPOFOL N/A 04/29/2022   Procedure: ESOPHAGOGASTRODUODENOSCOPY (EGD) WITH PROPOFOL;  Surgeon: Lanelle Bal, DO;  [facility];  Service: Endoscopy;   Laterality: N/A;   KNEE ARTHROSCOPY WITH SUBCHONDROPLASTY Right 06/20/2023   Procedure: KNEE ARTHROSCOPY, PARTIAL MEDIAL MENISCECTOMY WITH MEDIAL TIBIA SUBCHONDROPLASTY;  Surgeon: Yolonda Kida, MD;  Location: [facility];  Service: Orthopedics;  Laterality: Right;   LAPAROSCOPIC BILATERAL SALPINGECTOMY Bilateral 05/01/2022   Procedure: LAPAROSCOPIC BILATERAL SALPINGECTOMY;  Surgeon: Lazaro Arms, MD;  Location: [facility];  Service: Gynecology;  Laterality: Bilateral;   MASS EXCISION Right 08/04/2017   Procedure: EXCISION OF RIGHT NASAL  MASS;  Surgeon: Newman Pies, MD;  Location: [facility];  Service: ENT;  Laterality: Right;  LOCAL   POLYPECTOMY  04/29/2022   Procedure: POLYPECTOMY;  Surgeon: Lanelle Bal, DO;  [facility];  Service: Endoscopy;;   REMOVAL OF NON VAGINAL CONTRACEPTIVE DEVICE N/A 05/01/2022   Procedure: REMOVAL OF NON VAGINAL CONTRACEPTIVE DEVICE;  Surgeon: Lazaro Arms, MD;  Location: [facility];  Service: Gynecology;  Laterality: N/A;   WRIST SURGERY      OB History     Gravida  3   Para  2   Term  2   Preterm      AB  1   Living  2      SAB      IAB      Ectopic      Multiple      Live Births  1            Home Medications  Prior to Admission medications   Medication Sig Start Date End Date Taking? Authorizing Provider  cephALEXin (KEFLEX) 500 MG capsule Take 1 capsule (500 mg total) by mouth 4 (four) times daily for 5 days. 07/05/23 07/10/23 Yes Shaniqwa Horsman-Warren, Sadie Haber, NP  fluconazole (DIFLUCAN) 150 MG tablet Take 1 tablet by mouth today.  May repeat every 72 hours up to 2 additional doses. 07/05/23  Yes Talonda Artist-Warren, Sadie Haber, NP  nystatin-triamcinolone (MYCOLOG II) cream Apply to the affected areas twice daily until symptoms improve. 07/05/23  Yes Jaime Grizzell-Warren, Sadie Haber, NP  Ascorbic Acid (VITAMIN C PO) Take 1 tablet by mouth daily.    [provider]  atorvastatin (LIPITOR) 80 MG  tablet Take 1 tablet (80 mg total) by mouth daily. 01/02/23   Tommie Sams, DO  ferrous sulfate 325 (65 FE) MG tablet Take 325 mg by mouth daily with breakfast.    [provider]  fluticasone (FLONASE) 50 MCG/ACT nasal spray Place 2 sprays into both nostrils daily. 10/08/22   Latarra Eagleton-Warren, Sadie Haber, NP  hydrochlorothiazide (HYDRODIURIL) 25 MG tablet Take 1 tablet (25 mg total) by mouth daily. 04/18/23   Tommie Sams, DO  ibuprofen (ADVIL) 600 MG tablet Take 600 mg by mouth every 6 (six) hours as needed for moderate pain.    [provider]  ketoconazole (NIZORAL) 2 % cream Apply TOPICALLY once daily for 2 weeks 04/09/23   Mardella Layman, MD  losartan (COZAAR) 100 MG tablet Take 1 tablet (100 mg total) by mouth daily. 01/02/23   Tommie Sams, DO  Omega-3 Fatty Acids (FISH OIL PO) Take 1 capsule by mouth daily.    [provider]  ondansetron (ZOFRAN) 4 MG tablet Take 1 tablet (4 mg total) by mouth every 8 (eight) hours as needed for nausea or vomiting. 06/20/23   Yolonda Kida, MD  oxyCODONE (ROXICODONE) 5 MG immediate release tablet Take 1 tablet (5 mg total) by mouth every 4 (four) hours as needed for severe pain or moderate pain. 06/20/23 06/19/24  Yolonda Kida, MD  pantoprazole (PROTONIX) 40 MG tablet Take 1 tablet (40 mg total) by mouth daily. 01/02/23   Tommie Sams, DO    Family History Family History  Problem Relation Age of Onset   Diabetes Mother    Hypertension Mother    Arthritis Mother    Hyperlipidemia Mother    Stroke Mother    Hypertension Father    Heart disease Father 26       Valve replacement   Hyperlipidemia Father    Asthma Daughter    Hypertension Brother    Stroke Maternal Grandmother    Allergies Daughter     Social History Social History   Tobacco Use   Smoking status: Former    Current packs/day: 0.50    Types: Cigarettes    Passive exposure: Current   Smokeless tobacco: Never  Vaping Use   Vaping status:  Never Used  Substance Use Topics   Alcohol use: No   Drug use: No     Allergies   Patient has no known allergies.   Review of Systems Review of Systems Per HPI  Physical Exam Triage Vital Signs ED Triage Vitals  Encounter Vitals Group     BP 07/05/23 0812 (!) 160/89     Systolic BP Percentile --      Diastolic BP Percentile --      Pulse Rate 07/05/23 0812 80     Resp 07/05/23 0812 18  Temp 07/05/23 0812 98.3 F (36.8 C)     Temp Source 07/05/23 0812 Oral     SpO2 07/05/23 0812 100 %     Weight --      Height --      Head Circumference --      Peak Flow --      Pain Score 07/05/23 0814 0     Pain Loc --      Pain Education --      Exclude from Growth Chart --    No data found.  Updated Vital Signs BP (!) 160/89 (BP Location: Right Arm)   Pulse 80   Temp 98.3 F (36.8 C) (Oral)   Resp 18   LMP 06/17/2023 (Approximate)   SpO2 100%   Visual Acuity Right Eye Distance:   Left Eye Distance:   Bilateral Distance:    Right Eye Near:   Left Eye Near:    Bilateral Near:     Physical Exam Vitals and nursing note reviewed.  Constitutional:      General: She is not in acute distress.    Appearance: Normal appearance.  HENT:     Head: Normocephalic.  Eyes:     Extraocular Movements: Extraocular movements intact.     Pupils: Pupils are equal, round, and reactive to light.  Cardiovascular:     Rate and Rhythm: Normal rate and regular rhythm.     Pulses: Normal pulses.     Heart sounds: Normal heart sounds.  Pulmonary:     Effort: Pulmonary effort is normal. No respiratory distress.     Breath sounds: Normal breath sounds. No stridor. No wheezing, rhonchi or rales.  Abdominal:     General: Bowel sounds are normal.     Palpations: Abdomen is soft.     Tenderness: There is no abdominal tenderness.  Genitourinary:    Comments: GU exam deferred, self swab performed  Musculoskeletal:     Cervical back: Normal range of motion.  Lymphadenopathy:      Cervical: No cervical adenopathy.  Skin:    General: Skin is warm and dry.     Findings: Rash present.     Comments: Erythematous reddish-brown plaques noted in the skin folds underneath the breasts and in the groin area.  Satellite papules noted under the right breast.  Neurological:     General: No focal deficit present.     Mental Status: She is alert and oriented to person, place, and time.  Psychiatric:        Mood and Affect: Mood normal.        Behavior: Behavior normal.      UC Treatments / Results  Labs (all labs ordered are listed, but only abnormal results are displayed) Labs Reviewed - No data to display  EKG   Radiology No results found.  Procedures Procedures (including critical care time)  Medications Ordered in UC Medications - No data to display  Initial Impression / Assessment and Plan / UC Course  I have reviewed the triage vital signs and the nursing notes.  Pertinent labs & imaging results that were available during my care of the patient were reviewed by me and considered in my medical decision making (see chart for details).  The patient is well-appearing, she is in no acute distress, vital signs are stable.  Symptoms appear to be consistent with intertrigo.  Will treat with fluconazole 150 mg tablets for the vaginal itching, for the areas of the skin, Mycolog cream was prescribed.  Also Keflex 500 mg was prescribed for possible infection.  Supportive care recommendations were provided and discussed with the patient to include keeping the skin clean and dry, wearing loosefitting clothing, wearing cotton underwear, and drying thoroughly after showering.  Patient was advised to follow-up with her primary care physician if symptoms fail to improve with this treatment.  Cytology swab is pending.  Patient was advised she will be contacted if the pending test results are abnormal.  Patient is in agreement with this plan of care and verbalizes understanding.   All questions were answered.  Patient stable for discharge.  Final Clinical Impressions(s) / UC Diagnoses   Final diagnoses:  Intertriginous candidiasis     Discharge Instructions      Cytology swab is pending.  Results will be available within the next 24 to 72 hours.  If your results are abnormal, you will be contacted. Take medication as prescribed. Increase fluids and allow for plenty of rest. Continue to keep the areas clean and dry. Make sure you are wearing loosefitting clothing and cotton underwear. Make sure you dry thoroughly after showering. Recommend wearing dry fit bras. If symptoms do not improve with this treatment, please follow-up with your primary care physician for further evaluation. Follow-up as needed.     ED Prescriptions     Medication Sig Dispense Auth. Provider   fluconazole (DIFLUCAN) 150 MG tablet Take 1 tablet by mouth today.  May repeat every 72 hours up to 2 additional doses. 3 tablet Dago Jungwirth-Warren, Sadie Haber, NP   nystatin-triamcinolone (MYCOLOG II) cream Apply to the affected areas twice daily until symptoms improve. 60 g Benjamyn Hestand-Warren, Sadie Haber, NP   cephALEXin (KEFLEX) 500 MG capsule Take 1 capsule (500 mg total) by mouth 4 (four) times daily for 5 days. 20 capsule Lorrain Rivers-Warren, Sadie Haber, NP      PDMP not reviewed this encounter.   Abran Cantor, NP 07/05/23 [phone]

## 2023-07-07 DIAGNOSIS — M25661 Stiffness of right knee, not elsewhere classified: Secondary | ICD-10-CM | POA: Diagnosis not present

## 2023-07-07 DIAGNOSIS — M25561 Pain in right knee: Secondary | ICD-10-CM | POA: Diagnosis not present

## 2023-07-07 LAB — CERVICOVAGINAL ANCILLARY ONLY
Bacterial Vaginitis (gardnerella): NEGATIVE
Candida Glabrata: NEGATIVE
Candida Vaginitis: NEGATIVE
Chlamydia: NEGATIVE
Comment: NEGATIVE
Comment: NEGATIVE
Comment: NEGATIVE
Comment: NEGATIVE
Comment: NEGATIVE
Comment: NORMAL
Neisseria Gonorrhea: NEGATIVE
Trichomonas: NEGATIVE

## 2023-07-08 ENCOUNTER — Ambulatory Visit: Payer: No Typology Code available for payment source | Admitting: Family Medicine

## 2023-07-09 ENCOUNTER — Other Ambulatory Visit: Payer: Self-pay | Admitting: Family Medicine

## 2023-07-09 DIAGNOSIS — I10 Essential (primary) hypertension: Secondary | ICD-10-CM

## 2023-07-10 DIAGNOSIS — M25561 Pain in right knee: Secondary | ICD-10-CM | POA: Diagnosis not present

## 2023-07-10 DIAGNOSIS — M25661 Stiffness of right knee, not elsewhere classified: Secondary | ICD-10-CM | POA: Diagnosis not present

## 2023-07-14 DIAGNOSIS — M25661 Stiffness of right knee, not elsewhere classified: Secondary | ICD-10-CM | POA: Diagnosis not present

## 2023-07-14 DIAGNOSIS — M25561 Pain in right knee: Secondary | ICD-10-CM | POA: Diagnosis not present

## 2023-07-16 ENCOUNTER — Ambulatory Visit: Payer: No Typology Code available for payment source

## 2023-07-16 DIAGNOSIS — Z111 Encounter for screening for respiratory tuberculosis: Secondary | ICD-10-CM

## 2023-07-17 DIAGNOSIS — M25561 Pain in right knee: Secondary | ICD-10-CM | POA: Diagnosis not present

## 2023-07-17 DIAGNOSIS — M25661 Stiffness of right knee, not elsewhere classified: Secondary | ICD-10-CM | POA: Diagnosis not present

## 2023-07-18 ENCOUNTER — Ambulatory Visit: Payer: No Typology Code available for payment source

## 2023-07-18 ENCOUNTER — Encounter: Payer: Self-pay | Admitting: Family Medicine

## 2023-07-18 LAB — TB SKIN TEST
Induration: 0 mm
TB Skin Test: NEGATIVE

## 2023-07-21 DIAGNOSIS — M25561 Pain in right knee: Secondary | ICD-10-CM | POA: Diagnosis not present

## 2023-07-21 DIAGNOSIS — M25661 Stiffness of right knee, not elsewhere classified: Secondary | ICD-10-CM | POA: Diagnosis not present

## 2023-07-24 DIAGNOSIS — M25561 Pain in right knee: Secondary | ICD-10-CM | POA: Diagnosis not present

## 2023-07-24 DIAGNOSIS — M25661 Stiffness of right knee, not elsewhere classified: Secondary | ICD-10-CM | POA: Diagnosis not present

## 2023-07-31 ENCOUNTER — Encounter (HOSPITAL_COMMUNITY): Payer: No Typology Code available for payment source

## 2023-08-04 ENCOUNTER — Ambulatory Visit (HOSPITAL_COMMUNITY)
Admission: RE | Admit: 2023-08-04 | Discharge: 2023-08-04 | Disposition: A | Discharge status: Home / Self Care | Payer: No Typology Code available for payment source | Source: Ambulatory Visit | Attending: Family Medicine | Admitting: Family Medicine

## 2023-08-04 DIAGNOSIS — Z1231 Encounter for screening mammogram for malignant neoplasm of breast: Secondary | ICD-10-CM | POA: Insufficient documentation

## 2023-08-05 ENCOUNTER — Other Ambulatory Visit: Payer: Self-pay | Admitting: Family Medicine

## 2023-08-16 ENCOUNTER — Other Ambulatory Visit: Payer: Self-pay | Admitting: Family Medicine

## 2023-08-21 ENCOUNTER — Telehealth: Payer: No Typology Code available for payment source | Admitting: Emergency Medicine

## 2023-08-21 DIAGNOSIS — R21 Rash and other nonspecific skin eruption: Secondary | ICD-10-CM | POA: Diagnosis not present

## 2023-08-21 MED ORDER — TRIAMCINOLONE ACETONIDE 0.025 % EX OINT: 1.0000 | TOPICAL_OINTMENT | Freq: Two times a day (BID) | CUTANEOUS | 0 refills | Status: DC

## 2023-08-21 NOTE — Patient Instructions (Signed)
Renato Battles, thank you for joining Roxy Horseman, PA-C for today's virtual visit.  While this provider is not your primary care provider (PCP), if your PCP is located in our provider database this encounter information will be shared with them immediately following your visit.   A [facility] MyChart account gives you access to today's visit and all your visits, tests, and labs performed at [facility] " click here if you don't have a [facility] MyChart account or go to [email or web address]  Consent: (Patient) Miranda Wade provided verbal consent for this virtual visit at the beginning of the encounter.  Current Medications:  Current Outpatient Medications:    triamcinolone (KENALOG) 0.025 % ointment, Apply 1 Application topically 2 (two) times daily., Disp: 30 g, Rfl: 0   Ascorbic Acid (VITAMIN C PO), Take 1 tablet by mouth daily., Disp: , Rfl:    atorvastatin (LIPITOR) 80 MG tablet, Take 1 tablet by mouth once daily, Disp: 30 tablet, Rfl: 0   ferrous sulfate 325 (65 FE) MG tablet, Take 325 mg by mouth daily with breakfast., Disp: , Rfl:    fluconazole (DIFLUCAN) 150 MG tablet, Take 1 tablet by mouth today.  May repeat every 72 hours up to 2 additional doses., Disp: 3 tablet, Rfl: 0   fluticasone (FLONASE) 50 MCG/ACT nasal spray, Place 2 sprays into both nostrils daily., Disp: 16 g, Rfl: 0   hydrochlorothiazide (HYDRODIURIL) 25 MG tablet, Take 1 tablet (25 mg total) by mouth daily., Disp: 90 tablet, Rfl: 3   ibuprofen (ADVIL) 600 MG tablet, Take 600 mg by mouth every 6 (six) hours as needed for moderate pain., Disp: , Rfl:    ketoconazole (NIZORAL) 2 % cream, Apply TOPICALLY once daily for 2 weeks, Disp: 60 g, Rfl: 0   losartan (COZAAR) 100 MG tablet, Take 1 tablet by mouth once daily, Disp: 30 tablet, Rfl: 0   nystatin-triamcinolone (MYCOLOG II) cream, Apply to the affected areas twice daily until symptoms improve., Disp: 60 g, Rfl: 0   Omega-3 Fatty Acids (FISH  OIL PO), Take 1 capsule by mouth daily., Disp: , Rfl:    ondansetron (ZOFRAN) 4 MG tablet, Take 1 tablet (4 mg total) by mouth every 8 (eight) hours as needed for nausea or vomiting., Disp: 20 tablet, Rfl: 0   oxyCODONE (ROXICODONE) 5 MG immediate release tablet, Take 1 tablet (5 mg total) by mouth every 4 (four) hours as needed for severe pain or moderate pain., Disp: 22 tablet, Rfl: 0   pantoprazole (PROTONIX) 40 MG tablet, Take 1 tablet by mouth once daily, Disp: 30 tablet, Rfl: 0  Current Facility-Administered Medications:    Gerhardt's butt cream, , Topical, TID, Eure, Amaryllis Dyke, MD   Medications ordered in this encounter:  Meds ordered this encounter  Medications   triamcinolone (KENALOG) 0.025 % ointment    Sig: Apply 1 Application topically 2 (two) times daily.    Dispense:  30 g    Refill:  0    Order Specific Question:   Supervising Provider    Answer:   Merrilee Jansky [id number]     *If you need refills on other medications prior to your next appointment, please contact your pharmacy*  Follow-Up: Call back or seek an in-person evaluation if the symptoms worsen or if the condition fails to improve as anticipated.  [facility] [phone]  Other Instructions    If you have been instructed to have an in-person evaluation today at a local Urgent Care  facility, please use the link below. It will take you to a list of all of our available [facility], including address, phone number and hours of operation. Please do not delay care.  [facility]  If you or a family member do not have a primary care provider, use the link below to schedule a visit and establish care. When you choose a [facility] primary care physician or advanced practice provider, you gain a long-term partner in health. Find a Primary Care Provider  Learn more about [facility]'s in-office and virtual care options: [facility] - Get Care Now

## 2023-08-21 NOTE — Progress Notes (Signed)
Virtual Visit Consent   Miranda Wade, you are scheduled for a virtual visit with a [facility] provider today. Just as with appointments in the office, your consent must be obtained to participate. Your consent will be active for this visit and any virtual visit you may have with one of our providers in the next 365 days. If you have a MyChart account, a copy of this consent can be sent to you electronically.  As this is a virtual visit, video technology does not allow for your provider to perform a traditional examination. This may limit your provider's ability to fully assess your condition. If your provider identifies any concerns that need to be evaluated in person or the need to arrange testing (such as labs, EKG, etc.), we will make arrangements to do so. Although advances in technology are sophisticated, we cannot ensure that it will always work on either your end or our end. If the connection with a video visit is poor, the visit may have to be switched to a telephone visit. With either a video or telephone visit, we are not always able to ensure that we have a secure connection.  By engaging in this virtual visit, you consent to the provision of healthcare and authorize for your insurance to be billed (if applicable) for the services provided during this visit. Depending on your insurance coverage, you may receive a charge related to this service.  I need to obtain your verbal consent now. Are you willing to proceed with your visit today? KATELYNNE FAIRBROTHER has provided verbal consent on 08/21/2023 for a virtual visit (video or telephone). Roxy Horseman, PA-C  Date: 08/21/2023 2:10 PM  Virtual Visit via Video Note   I, Roxy Horseman, connected with  Miranda Wade  ([id number], November 10, 1980) on 08/21/23 at  2:15 PM EDT by a video-enabled telemedicine application and verified that I am speaking with the correct person using two identifiers.  Location: Patient: Virtual Visit Location  Patient: Home Provider: Virtual Visit Location Provider: Home Office   I discussed the limitations of evaluation and management by telemedicine and the availability of in person appointments. The patient expressed understanding and agreed to proceed.    History of Present Illness: Miranda Wade is a 42 y.o. who identifies as a female who was assigned female at birth, and is being seen today for bumpy rash on the palm.  States that it doesn't itch.  States that it doesn't hurt unless she picks at it.  Patient states that it has been present for about 2 weeks.  She denies fever, chills, cough.  Denies any new soaps or detergents or lotions.  HPI: HPI  Problems:  Patient Active Problem List   Diagnosis Date Noted   Diarrhea 05/25/2023   GERD (gastroesophageal reflux disease) 01/02/2023   LVH (left ventricular hypertrophy) 07/21/2017   Essential hypertension, benign 04/30/2016   Morbid obesity (HCC) 04/30/2016   Hyperlipidemia 04/30/2016    Allergies: No Known Allergies Medications:  Current Outpatient Medications:    Ascorbic Acid (VITAMIN C PO), Take 1 tablet by mouth daily., Disp: , Rfl:    atorvastatin (LIPITOR) 80 MG tablet, Take 1 tablet by mouth once daily, Disp: 30 tablet, Rfl: 0   ferrous sulfate 325 (65 FE) MG tablet, Take 325 mg by mouth daily with breakfast., Disp: , Rfl:    fluconazole (DIFLUCAN) 150 MG tablet, Take 1 tablet by mouth today.  May repeat every 72 hours up to 2 additional doses., Disp: 3  tablet, Rfl: 0   fluticasone (FLONASE) 50 MCG/ACT nasal spray, Place 2 sprays into both nostrils daily., Disp: 16 g, Rfl: 0   hydrochlorothiazide (HYDRODIURIL) 25 MG tablet, Take 1 tablet (25 mg total) by mouth daily., Disp: 90 tablet, Rfl: 3   ibuprofen (ADVIL) 600 MG tablet, Take 600 mg by mouth every 6 (six) hours as needed for moderate pain., Disp: , Rfl:    ketoconazole (NIZORAL) 2 % cream, Apply TOPICALLY once daily for 2 weeks, Disp: 60 g, Rfl: 0   losartan (COZAAR) 100  MG tablet, Take 1 tablet by mouth once daily, Disp: 30 tablet, Rfl: 0   nystatin-triamcinolone (MYCOLOG II) cream, Apply to the affected areas twice daily until symptoms improve., Disp: 60 g, Rfl: 0   Omega-3 Fatty Acids (FISH OIL PO), Take 1 capsule by mouth daily., Disp: , Rfl:    ondansetron (ZOFRAN) 4 MG tablet, Take 1 tablet (4 mg total) by mouth every 8 (eight) hours as needed for nausea or vomiting., Disp: 20 tablet, Rfl: 0   oxyCODONE (ROXICODONE) 5 MG immediate release tablet, Take 1 tablet (5 mg total) by mouth every 4 (four) hours as needed for severe pain or moderate pain., Disp: 22 tablet, Rfl: 0   pantoprazole (PROTONIX) 40 MG tablet, Take 1 tablet by mouth once daily, Disp: 30 tablet, Rfl: 0  Current Facility-Administered Medications:    Gerhardt's butt cream, , Topical, TID, Eure, Amaryllis Dyke, MD  Observations/Objective: Patient is well-developed, well-nourished in no acute distress.  Resting comfortably at home.  Head is normocephalic, atraumatic.  No labored breathing.  Speech is clear and coherent with logical content.  Patient is alert and oriented at baseline.  Dry bump rash on the right thenar eminence   Assessment and Plan: 1. Rash    Patient complains of sometimes itchy, sometimes painful rash on right palm.  She's concerned about skin cancer.  From the very limited view I was able to get on video, it looks more like a contact dermatitis vs dyshidrotic eczema.  Will give Kenalog.  Recommend PCP follow-up if not improving in a week.  Meds ordered this encounter  Medications   triamcinolone (KENALOG) 0.025 % ointment    Sig: Apply 1 Application topically 2 (two) times daily.    Dispense:  30 g    Refill:  0    Order Specific Question:   Supervising Provider    Answer:   Merrilee Jansky [id number]     Follow Up Instructions: I discussed the assessment and treatment plan with the patient. The patient was provided an opportunity to ask questions and all were  answered. The patient agreed with the plan and demonstrated an understanding of the instructions.  A copy of instructions were sent to the patient via MyChart unless otherwise noted below.     The patient was advised to call back or seek an in-person evaluation if the symptoms worsen or if the condition fails to improve as anticipated.    Roxy Horseman, PA-C

## 2023-08-28 DIAGNOSIS — Z4789 Encounter for other orthopedic aftercare: Secondary | ICD-10-CM | POA: Diagnosis not present

## 2023-08-28 DIAGNOSIS — M25562 Pain in left knee: Secondary | ICD-10-CM | POA: Diagnosis not present

## 2023-09-03 ENCOUNTER — Other Ambulatory Visit: Payer: Self-pay | Admitting: Family Medicine

## 2023-09-03 ENCOUNTER — Ambulatory Visit
Admission: EM | Admit: 2023-09-03 | Discharge: 2023-09-03 | Disposition: A | Discharge status: Home / Self Care | Payer: No Typology Code available for payment source | Attending: Nurse Practitioner | Admitting: Nurse Practitioner

## 2023-09-03 DIAGNOSIS — R21 Rash and other nonspecific skin eruption: Secondary | ICD-10-CM

## 2023-09-03 MED ORDER — TRIAMCINOLONE ACETONIDE 0.1 % EX CREA: 1.0000 | TOPICAL_CREAM | Freq: Two times a day (BID) | CUTANEOUS | 0 refills | Status: DC

## 2023-09-03 NOTE — Discharge Instructions (Signed)
Apply medication as prescribed. Keep the hands clean and dry. Recommend using moisturizer such as Eucerin or Aquaphor to the affected areas. Avoid picking or disrupting the skin while symptoms persist. If symptoms fail to improve, please follow-up in this clinic, specifically when you are having more symptoms, for further evaluation. Follow-up as needed.

## 2023-09-03 NOTE — ED Triage Notes (Signed)
Pt states itchy rash to her hands for the past month.  Has been using triamcinolone cream with no relief.

## 2023-09-03 NOTE — ED Provider Notes (Signed)
[facility]    CSN: [id number] Arrival date & time: 09/03/23  1326      History   Chief Complaint Chief Complaint  Patient presents with   Rash    HPI Miranda Wade is a 42 y.o. female.   The history is provided by the patient.   Patient presents for complaints of a rash to the palms of her hands.  Patient states that the rash starts as "bumpy" on her palms.  She states that the rash does not itch, nor is it painful.  Reports the areas are just "dry skin" on her palms.  States that it doesn't hurt unless she picks at it.  Patient completed a video visit on 08/21/2023 and was prescribed triamcinolone cream 0.25%.  Patient states symptoms have since improved since she started using the cream. She denies fever, chills, cough.  Denies any new soaps or detergents or lotions.  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  She states that she does use her on hand soap when she is going into patient's homes.  Past Medical History:  Diagnosis Date   Anemia    Essential hypertension    Hyperlipidemia    Seasonal allergies     Patient Active Problem List   Diagnosis Date Noted   Diarrhea 05/25/2023   GERD (gastroesophageal reflux disease) 01/02/2023   LVH (left ventricular hypertrophy) 07/21/2017   Essential hypertension, benign 04/30/2016   Morbid obesity (HCC) 04/30/2016   Hyperlipidemia 04/30/2016    Past Surgical History:  Procedure Laterality Date   BIOPSY  04/29/2022   Procedure: BIOPSY;  Surgeon: Lanelle Bal, DO;  [facility];  Service: Endoscopy;;   COLONOSCOPY WITH PROPOFOL N/A 04/29/2022   Procedure: COLONOSCOPY WITH PROPOFOL;  Surgeon: Lanelle Bal, DO;  [facility];  Service: Endoscopy;  Laterality: N/A;  2:00pm   ESOPHAGOGASTRODUODENOSCOPY (EGD) WITH PROPOFOL N/A 04/29/2022   Procedure: ESOPHAGOGASTRODUODENOSCOPY (EGD) WITH PROPOFOL;  Surgeon: Lanelle Bal, DO;  [facility];   Service: Endoscopy;  Laterality: N/A;   KNEE ARTHROSCOPY WITH SUBCHONDROPLASTY Right 06/20/2023   Procedure: KNEE ARTHROSCOPY, PARTIAL MEDIAL MENISCECTOMY WITH MEDIAL TIBIA SUBCHONDROPLASTY;  Surgeon: Yolonda Kida, MD;  Location: [facility];  Service: Orthopedics;  Laterality: Right;   LAPAROSCOPIC BILATERAL SALPINGECTOMY Bilateral 05/01/2022   Procedure: LAPAROSCOPIC BILATERAL SALPINGECTOMY;  Surgeon: Lazaro Arms, MD;  [facility]: [facility];  Service: Gynecology;  Laterality: Bilateral;   MASS EXCISION Right 08/04/2017   Procedure: EXCISION OF RIGHT NASAL  MASS;  Surgeon: Newman Pies, MD;  Location: [facility];  Service: ENT;  Laterality: Right;  LOCAL   POLYPECTOMY  04/29/2022   Procedure: POLYPECTOMY;  Surgeon: Lanelle Bal, DO;  [facility];  Service: Endoscopy;;   REMOVAL OF NON VAGINAL CONTRACEPTIVE DEVICE N/A 05/01/2022   Procedure: REMOVAL OF NON VAGINAL CONTRACEPTIVE DEVICE;  Surgeon: Lazaro Arms, MD;  [facility]: [facility];  Service: Gynecology;  Laterality: N/A;   WRIST SURGERY      OB History     Gravida  3   Para  2   Term  2   Preterm      AB  1   Living  2      SAB      IAB      Ectopic      Multiple      Live Births  1  Home Medications    Prior to Admission medications   Medication Sig Start Date End Date Taking? Authorizing Provider  triamcinolone cream (KENALOG) 0.1 % Apply 1 Application topically 2 (two) times daily. 09/03/23  Yes Leath-Warren, Sadie Haber, NP  Ascorbic Acid (VITAMIN C PO) Take 1 tablet by mouth daily.    [provider]  atorvastatin (LIPITOR) 80 MG tablet Take 1 tablet by mouth once daily 08/18/23   Everlene Other G, DO  ferrous sulfate 325 (65 FE) MG tablet Take 325 mg by mouth daily with breakfast.    [provider]  fluconazole (DIFLUCAN) 150 MG tablet Take 1 tablet by mouth today.  May repeat every 72 hours up to 2 additional doses. 07/05/23    Leath-Warren, Sadie Haber, NP  fluticasone (FLONASE) 50 MCG/ACT nasal spray Place 2 sprays into both nostrils daily. 10/08/22   Leath-Warren, Sadie Haber, NP  hydrochlorothiazide (HYDRODIURIL) 25 MG tablet Take 1 tablet (25 mg total) by mouth daily. 04/18/23   Tommie Sams, DO  ibuprofen (ADVIL) 600 MG tablet Take 600 mg by mouth every 6 (six) hours as needed for moderate pain.    [provider]  ketoconazole (NIZORAL) 2 % cream Apply TOPICALLY once daily for 2 weeks 04/09/23   Mardella Layman, MD  losartan (COZAAR) 100 MG tablet Take 1 tablet by mouth once daily 07/09/23   Tommie Sams, DO  nystatin-triamcinolone (MYCOLOG II) cream Apply to the affected areas twice daily until symptoms improve. 07/05/23   Leath-Warren, Sadie Haber, NP  Omega-3 Fatty Acids (FISH OIL PO) Take 1 capsule by mouth daily.    [provider]  ondansetron (ZOFRAN) 4 MG tablet Take 1 tablet (4 mg total) by mouth every 8 (eight) hours as needed for nausea or vomiting. 06/20/23   Yolonda Kida, MD  oxyCODONE (ROXICODONE) 5 MG immediate release tablet Take 1 tablet (5 mg total) by mouth every 4 (four) hours as needed for severe pain or moderate pain. 06/20/23 06/19/24  Yolonda Kida, MD  pantoprazole (PROTONIX) 40 MG tablet Take 1 tablet by mouth once daily 09/03/23   Tommie Sams, DO    Family History Family History  Problem Relation Age of Onset   Diabetes Mother    Hypertension Mother    Arthritis Mother    Hyperlipidemia Mother    Stroke Mother    Hypertension Father    Heart disease Father 29       Valve replacement   Hyperlipidemia Father    Asthma Daughter    Hypertension Brother    Stroke Maternal Grandmother    Allergies Daughter     Social History Social History   Tobacco Use   Smoking status: Former    Current packs/day: 0.50    Types: Cigarettes    Passive exposure: Current   Smokeless tobacco: Never  Vaping Use   Vaping status: Never Used  Substance Use Topics    Alcohol use: No   Drug use: No     Allergies   Patient has no known allergies.   Review of Systems Review of Systems Per HPI  Physical Exam Triage Vital Signs ED Triage Vitals  Encounter Vitals Group     BP 09/03/23 1425 138/89     Systolic BP Percentile --      Diastolic BP Percentile --      Pulse Rate 09/03/23 1423 73     Resp 09/03/23 1423 16     Temp 09/03/23 1423 98.1 F (36.7  C)     Temp Source 09/03/23 1423 Oral     SpO2 09/03/23 1423 100 %     Weight --      Height --      Head Circumference --      Peak Flow --      Pain Score --      Pain Loc --      Pain Education --      Exclude from Growth Chart --    No data found.  Updated Vital Signs BP 138/89 (BP Location: Right Arm)   Pulse 73   Temp 98.1 F (36.7 C) (Oral)   Resp 16   LMP 08/10/2023 (Approximate)   SpO2 100%   Visual Acuity Right Eye Distance:   Left Eye Distance:   Bilateral Distance:    Right Eye Near:   Left Eye Near:    Bilateral Near:     Physical Exam Vitals and nursing note reviewed.  Constitutional:      General: She is not in acute distress.    Appearance: Normal appearance.  HENT:     Head: Normocephalic.  Pulmonary:     Effort: Pulmonary effort is normal.  Musculoskeletal:     Cervical back: Normal range of motion.  Skin:    General: Skin is warm and dry.     Comments: Patches of dry skin noted to the palms of her hand.  On the left palm, the area is located at the thenar eminence and thenar palmar crease.  The right hand has a resolving area of dry skin at the pisiform.  There is no erythema, drainage, swelling, or fluctuance present.  Neurological:     General: No focal deficit present.     Mental Status: She is alert and oriented to person, place, and time.  Psychiatric:        Mood and Affect: Mood normal.        Behavior: Behavior normal.      UC Treatments / Results  Labs (all labs ordered are listed, but only abnormal results are displayed) Labs  Reviewed - No data to display  EKG   Radiology No results found.  Procedures Procedures (including critical care time)  Medications Ordered in UC Medications - No data to display  Initial Impression / Assessment and Plan / UC Course  I have reviewed the triage vital signs and the nursing notes.  Pertinent labs & imaging results that were available during my care of the patient were reviewed by me and considered in my medical decision making (see chart for details).  Patient with area of dry skin to the palms of her bilateral hands.  She has found relief of symptoms with triamcinolone cream.  Will provide another prescription for triamcinolone cream, increasing the dose to 0.1%.  Supportive care recommendations were provided and discussed with the patient to include keeping the hands clean and dry, keeping the hands moisturized with Eucerin or Aquaphor, and avoiding picking or disrupting the skin.  Patient was advised to follow-up if symptoms do not improve, especially when she is having an active outbreak.  Patient is in agreement with this plan of care and verbalizes understanding.  All questions were answered.  Patient stable for discharge.  Final Clinical Impressions(s) / UC Diagnoses   Final diagnoses:  Rash and nonspecific skin eruption     Discharge Instructions      Apply medication as prescribed. Keep the hands clean and dry. Recommend using moisturizer such as  Eucerin or Aquaphor to the affected areas. Avoid picking or disrupting the skin while symptoms persist. If symptoms fail to improve, please follow-up in this clinic, specifically when you are having more symptoms, for further evaluation. Follow-up as needed.     ED Prescriptions     Medication Sig Dispense Auth. Provider   triamcinolone cream (KENALOG) 0.1 % Apply 1 Application topically 2 (two) times daily. 45 g Leath-Warren, Sadie Haber, NP      PDMP not reviewed this encounter.   Abran Cantor, NP 09/03/23 1909

## 2023-09-05 ENCOUNTER — Ambulatory Visit: Payer: No Typology Code available for payment source | Admitting: Obstetrics & Gynecology

## 2023-09-06 ENCOUNTER — Other Ambulatory Visit: Payer: Self-pay | Admitting: Family Medicine

## 2023-09-06 DIAGNOSIS — I10 Essential (primary) hypertension: Secondary | ICD-10-CM

## 2023-09-12 ENCOUNTER — Other Ambulatory Visit (HOSPITAL_COMMUNITY)
Admission: RE | Admit: 2023-09-12 | Discharge: 2023-09-12 | Disposition: A | Discharge status: Home / Self Care | Payer: No Typology Code available for payment source | Source: Ambulatory Visit | Attending: Obstetrics & Gynecology | Admitting: Obstetrics & Gynecology

## 2023-09-12 ENCOUNTER — Encounter: Payer: Self-pay | Admitting: Obstetrics & Gynecology

## 2023-09-12 ENCOUNTER — Ambulatory Visit (INDEPENDENT_AMBULATORY_CARE_PROVIDER_SITE_OTHER): Payer: No Typology Code available for payment source | Admitting: Obstetrics & Gynecology

## 2023-09-12 VITALS — BP 132/95 | HR 78 | Ht 65.0 in | Wt 228.0 lb

## 2023-09-12 DIAGNOSIS — R300 Dysuria: Secondary | ICD-10-CM

## 2023-09-12 DIAGNOSIS — Z01419 Encounter for gynecological examination (general) (routine) without abnormal findings: Secondary | ICD-10-CM | POA: Diagnosis not present

## 2023-09-12 DIAGNOSIS — B3731 Acute candidiasis of vulva and vagina: Secondary | ICD-10-CM

## 2023-09-12 LAB — POCT URINALYSIS DIPSTICK OB
Glucose, UA: NEGATIVE
Ketones, UA: NEGATIVE
Leukocytes, UA: NEGATIVE
Nitrite, UA: NEGATIVE
POC,PROTEIN,UA: NEGATIVE

## 2023-09-12 MED ORDER — FLUCONAZOLE 100 MG PO TABS: 100.0000 mg | ORAL_TABLET | Freq: Every day | ORAL | 0 refills | Status: DC

## 2023-09-12 NOTE — Progress Notes (Signed)
Follow up appointment for recurrent vulvovagintis   Chief Complaint  Patient presents with   Annual Exam    Blood pressure (!) 132/95, pulse 78, height 5\' 5"  (1.651 m), weight 228 lb (103.4 kg), last menstrual period 09/07/2023.  Saw 03/2023 with intense vulvar and perianal itching Painted with GV + 14d course of diflucan A1C was normal  Re eval 3 weeks later with resolution of symptoms Rx: Fanny cream for management  Subsequently her symptoms have returned Having sweating in the area which I think is contributing to the moisture changes and leading to yeast  Went to Urgent care and given a steroid cream  On exam today: Yeast is once again present in the intertriginous folds and perianal areas Painted with GV  General WDWN female NAD Vulva:  normal appearing vulva with no masses, tenderness or lesions Vagina: yeast is present Cervix:  Normal no lesions Uterus:  normal size, contour, position, consistency, mobility, non-tender Adnexa: ovaries:present,  normal adnexa in size, nontender and no masses     MEDS ordered this encounter: Meds ordered this encounter  Medications   fluconazole (DIFLUCAN) 100 MG tablet    Sig: Take 1 tablet (100 mg total) by mouth daily.    Dispense:  14 tablet    Refill:  0    Orders for this encounter: Orders Placed This Encounter  Procedures   POC Urinalysis Dipstick OB    Impression + Management Plan   ICD-10-CM   1. Candidal vulvovaginitis, recurrent  B37.31    Painted with GV, resolved with Diflucan 14d course, will repeat and follow up    2. Burning with urination  R30.0 POC Urinalysis Dipstick OB   when urine strikes the irritated tissue, UA is negative      Follow Up: Return in about 3 weeks (around 10/03/2023) for Follow up, with Dr Despina Hidden.     All questions were answered.  Past Medical History:  Diagnosis Date   Anemia    Essential hypertension    Hyperlipidemia    Seasonal allergies     Past Surgical History:   Procedure Laterality Date   BIOPSY  04/29/2022   Procedure: BIOPSY;  Surgeon: Lanelle Bal, DO;  [facility];  Service: Endoscopy;;   COLONOSCOPY WITH PROPOFOL N/A 04/29/2022   Procedure: COLONOSCOPY WITH PROPOFOL;  Surgeon: Lanelle Bal, DO;  [facility];  Service: Endoscopy;  Laterality: N/A;  2:00pm   ESOPHAGOGASTRODUODENOSCOPY (EGD) WITH PROPOFOL N/A 04/29/2022   Procedure: ESOPHAGOGASTRODUODENOSCOPY (EGD) WITH PROPOFOL;  Surgeon: Lanelle Bal, DO;  [facility];  Service: Endoscopy;  Laterality: N/A;   KNEE ARTHROSCOPY WITH SUBCHONDROPLASTY Right 06/20/2023   Procedure: KNEE ARTHROSCOPY, PARTIAL MEDIAL MENISCECTOMY WITH MEDIAL TIBIA SUBCHONDROPLASTY;  Surgeon: Yolonda Kida, MD;  Location: [facility];  Service: Orthopedics;  Laterality: Right;   LAPAROSCOPIC BILATERAL SALPINGECTOMY Bilateral 05/01/2022   Procedure: LAPAROSCOPIC BILATERAL SALPINGECTOMY;  Surgeon: Lazaro Arms, MD;  Location[facility];  Service: Gynecology;  Laterality: Bilateral;   MASS EXCISION Right 08/04/2017   Procedure: EXCISION OF RIGHT NASAL  MASS;  Surgeon: Newman Pies, MD;  [facility];  Service: ENT;  Laterality: Right;  LOCAL   POLYPECTOMY  04/29/2022   Procedure: POLYPECTOMY;  Surgeon: Lanelle Bal, DO;  [facility];  Service: Endoscopy;;   REMOVAL OF NON VAGINAL CONTRACEPTIVE DEVICE N/A 05/01/2022   Procedure: REMOVAL OF NON VAGINAL CONTRACEPTIVE DEVICE;  Surgeon: Lazaro Arms, MD;  [facility];  Service: Gynecology;  Laterality: N/A;   WRIST SURGERY      OB History     Gravida  3   Para  2   Term  2   Preterm      AB  1   Living  2      SAB      IAB      Ectopic      Multiple      Live Births  1           No Known Allergies  Social History   Socioeconomic History   Marital status: Married    Spouse name: Barbara Cower   Number of children: 2   Years of education: 14    Highest education level: GED or equivalent  Occupational History   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  Tobacco Use   Smoking status: Former    Current packs/day: 0.50    Types: Cigarettes    Passive exposure: Current   Smokeless tobacco: Never  Vaping Use   Vaping status: Never Used  Substance and Sexual Activity   Alcohol use: No   Drug use: No   Sexual activity: Yes    Birth control/protection: Surgical    Comment: tubal  Other Topics Concern   Not on file  Social History Narrative   Lives daughter Howell Rucks is in college, home some weekends   Currently separated from husband   Social Determinants of Health   Financial Resource Strain: Low Risk  (09/12/2023)   Overall Financial Resource Strain (CARDIA)    Difficulty of Paying Living Expenses: Not hard at all  Food Insecurity: No Food Insecurity (09/12/2023)   Hunger Vital Sign    Worried About Running Out of Food in the Last Year: Never true    Ran Out of Food in the Last Year: Never true  Transportation Needs: No Transportation Needs (09/12/2023)   PRAPARE - Administrator, Civil Service (Medical): No    Lack of Transportation (Non-Medical): No  Physical Activity: Insufficiently Active (09/12/2023)   Exercise Vital Sign    Days of Exercise per Week: 2 days    Minutes of Exercise per Session: 30 min  Stress: No Stress Concern Present (09/12/2023)   [employer] of Occupational Health - Occupational Stress Questionnaire    Feeling of Stress : Not at all  Social Connections: Moderately Integrated (09/12/2023)   Social Connection and Isolation Panel [NHANES]    Frequency of Communication with Friends and Family: More than three times a week    Frequency of Social Gatherings with Friends and Family: Once a week    Attends Religious Services: 1 to 4 times per year    Active Member of [employer] or Organizations: No    Attends Banker Meetings: Never    Marital Status: Married     Family History  Problem Relation Age of Onset   Diabetes Mother    Hypertension Mother    Arthritis Mother    Hyperlipidemia Mother    Stroke Mother    Hypertension Father    Heart disease Father 26       Valve replacement   Hyperlipidemia Father    Asthma Daughter    Hypertension Brother    Stroke Maternal Grandmother    Allergies Daughter

## 2023-09-12 NOTE — Addendum Note (Signed)
Addended by: Caralyn Guile on: 09/12/2023 09:43 AM   Modules accepted: Orders

## 2023-09-16 ENCOUNTER — Other Ambulatory Visit: Payer: Self-pay | Admitting: Family Medicine

## 2023-09-16 LAB — CYTOLOGY - PAP
Comment: NEGATIVE
Diagnosis: NEGATIVE
High risk HPV: NEGATIVE

## 2023-09-17 ENCOUNTER — Other Ambulatory Visit: Payer: Self-pay | Admitting: Family Medicine

## 2023-09-18 ENCOUNTER — Encounter: Payer: Self-pay | Admitting: Obstetrics & Gynecology

## 2023-09-23 ENCOUNTER — Encounter: Payer: Self-pay | Admitting: Obstetrics & Gynecology

## 2023-09-23 MED ORDER — PREDNISONE 10 MG PO TABS: ORAL_TABLET | ORAL | 0 refills | Status: DC

## 2023-10-01 ENCOUNTER — Ambulatory Visit: Payer: No Typology Code available for payment source | Admitting: Obstetrics & Gynecology

## 2023-10-03 ENCOUNTER — Ambulatory Visit: Payer: No Typology Code available for payment source | Admitting: Obstetrics & Gynecology

## 2023-10-03 ENCOUNTER — Ambulatory Visit (INDEPENDENT_AMBULATORY_CARE_PROVIDER_SITE_OTHER): Payer: No Typology Code available for payment source | Admitting: Obstetrics & Gynecology

## 2023-10-03 ENCOUNTER — Encounter: Payer: Self-pay | Admitting: Obstetrics & Gynecology

## 2023-10-03 VITALS — BP 164/100 | HR 75 | Ht 65.0 in | Wt 228.0 lb

## 2023-10-03 DIAGNOSIS — L900 Lichen sclerosus et atrophicus: Secondary | ICD-10-CM

## 2023-10-03 MED ORDER — CLOBETASOL PROPIONATE 0.05 % EX CREA: 1.0000 | TOPICAL_CREAM | Freq: Two times a day (BID) | CUTANEOUS | 11 refills | Status: DC

## 2023-10-03 NOTE — Progress Notes (Signed)
Follow up appointment: Prednisone response  Chief Complaint  Patient presents with   Follow-up    Pt feels much better!! Still has a slight itch    Blood pressure (!) 136/90, pulse 69, height 5\' 5"  (1.651 m), weight 228 lb (103.4 kg), last menstrual period 09/29/2023.  No results found.  Resolution of her itching with oral prednisone alone Exam revelas no evidnce of LSA but her symptoms definitely point to that and her response to the prednisone No evidence of yeast or other dermatitis  MEDS ordered this encounter: Meds ordered this encounter  Medications   clobetasol cream (TEMOVATE) 0.05 %    Sig: Apply 1 Application topically 2 (two) times daily.    Dispense:  30 g    Refill:  11    Orders for this encounter: No orders of the defined types were placed in this encounter.   Impression + Management Plan   ICD-10-CM   1. Lichen sclerosus: process of elimination  L90.0    oral prednisone has eliminated her itching, transition to temovate .05% BID then titrate down over time      Follow Up: Return in about 6 weeks (around 11/14/2023) for Follow up, with Dr Despina Hidden.     All questions were answered.  Past Medical History:  Diagnosis Date   Anemia    Essential hypertension    Hyperlipidemia    Seasonal allergies     Past Surgical History:  Procedure Laterality Date   BIOPSY  04/29/2022   Procedure: BIOPSY;  Surgeon: Lanelle Bal, DO;  [facility];  Service: Endoscopy;;   COLONOSCOPY WITH PROPOFOL N/A 04/29/2022   Procedure: COLONOSCOPY WITH PROPOFOL;  Surgeon: Lanelle Bal, DO;  [facility];  Service: Endoscopy;  Laterality: N/A;  2:00pm   ESOPHAGOGASTRODUODENOSCOPY (EGD) WITH PROPOFOL N/A 04/29/2022   Procedure: ESOPHAGOGASTRODUODENOSCOPY (EGD) WITH PROPOFOL;  Surgeon: Lanelle Bal, DO;  [facility];  Service: Endoscopy;  Laterality: N/A;   KNEE ARTHROSCOPY WITH SUBCHONDROPLASTY Right 06/20/2023   Procedure: KNEE  ARTHROSCOPY, PARTIAL MEDIAL MENISCECTOMY WITH MEDIAL TIBIA SUBCHONDROPLASTY;  Surgeon: Yolonda Kida, MD;  Location: [facility];  Service: Orthopedics;  Laterality: Right;   LAPAROSCOPIC BILATERAL SALPINGECTOMY Bilateral 05/01/2022   Procedure: LAPAROSCOPIC BILATERAL SALPINGECTOMY;  Surgeon: Lazaro Arms, MD;  [facility];  Service: Gynecology;  Laterality: Bilateral;   MASS EXCISION Right 08/04/2017   Procedure: EXCISION OF RIGHT NASAL  MASS;  Surgeon: Newman Pies, MD;  Location: [facility];  Service: ENT;  Laterality: Right;  LOCAL   POLYPECTOMY  04/29/2022   Procedure: POLYPECTOMY;  Surgeon: Lanelle Bal, DO;  [facility];  Service: Endoscopy;;   REMOVAL OF NON VAGINAL CONTRACEPTIVE DEVICE N/A 05/01/2022   Procedure: REMOVAL OF NON VAGINAL CONTRACEPTIVE DEVICE;  Surgeon: Lazaro Arms, MD;  [facility];  Service: Gynecology;  Laterality: N/A;   WRIST SURGERY      OB History     Gravida  3   Para  2   Term  2   Preterm      AB  1   Living  2      SAB      IAB      Ectopic      Multiple      Live Births  1           No Known Allergies  Social History   Socioeconomic History   Marital status: Married  Spouse name: Barbara Cower   Number of children: 2   Years of education: 14   Highest education level: GED or equivalent  Occupational History   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  Tobacco Use   Smoking status: Former    Current packs/day: 0.50    Types: Cigarettes    Passive exposure: Current   Smokeless tobacco: Never  Vaping Use   Vaping status: Never Used  Substance and Sexual Activity   Alcohol use: No   Drug use: No   Sexual activity: Not Currently    Birth control/protection: Surgical    Comment: tubal  Other Topics Concern   Not on file  Social History Narrative   Lives daughter Howell Rucks is in college, home some weekends   Currently separated from husband    Social Drivers of Health   Financial Resource Strain: Low Risk  (09/12/2023)   Overall Financial Resource Strain (CARDIA)    Difficulty of Paying Living Expenses: Not hard at all  Food Insecurity: No Food Insecurity (09/12/2023)   Hunger Vital Sign    Worried About Running Out of Food in the Last Year: Never true    Ran Out of Food in the Last Year: Never true  Transportation Needs: No Transportation Needs (09/12/2023)   PRAPARE - Administrator, Civil Service (Medical): No    Lack of Transportation (Non-Medical): No  Physical Activity: Insufficiently Active (09/12/2023)   Exercise Vital Sign    Days of Exercise per Week: 2 days    Minutes of Exercise per Session: 30 min  Stress: No Stress Concern Present (09/12/2023)   [employer] of Occupational Health - Occupational Stress Questionnaire    Feeling of Stress : Not at all  Social Connections: Moderately Integrated (09/12/2023)   Social Connection and Isolation Panel [NHANES]    Frequency of Communication with Friends and Family: More than three times a week    Frequency of Social Gatherings with Friends and Family: Once a week    Attends Religious Services: 1 to 4 times per year    Active Member of [employer] or Organizations: No    Attends Banker Meetings: Never    Marital Status: Married    Family History  Problem Relation Age of Onset   Diabetes Mother    Hypertension Mother    Arthritis Mother    Hyperlipidemia Mother    Stroke Mother    Hypertension Father    Heart disease Father 67       Valve replacement   Hyperlipidemia Father    Asthma Daughter    Hypertension Brother    Stroke Maternal Grandmother    Allergies Daughter

## 2023-10-16 ENCOUNTER — Other Ambulatory Visit: Payer: Self-pay | Admitting: Family Medicine

## 2023-10-19 ENCOUNTER — Other Ambulatory Visit: Payer: Self-pay | Admitting: Family Medicine

## 2023-10-20 ENCOUNTER — Encounter: Payer: Self-pay | Admitting: Obstetrics & Gynecology

## 2023-10-27 ENCOUNTER — Encounter: Payer: Self-pay | Admitting: Obstetrics & Gynecology

## 2023-10-31 ENCOUNTER — Ambulatory Visit: Payer: No Typology Code available for payment source | Admitting: Obstetrics & Gynecology

## 2023-11-01 ENCOUNTER — Other Ambulatory Visit: Payer: Self-pay | Admitting: Family Medicine

## 2023-11-03 ENCOUNTER — Ambulatory Visit (INDEPENDENT_AMBULATORY_CARE_PROVIDER_SITE_OTHER): Payer: No Typology Code available for payment source | Admitting: Obstetrics & Gynecology

## 2023-11-03 ENCOUNTER — Encounter: Payer: Self-pay | Admitting: Obstetrics & Gynecology

## 2023-11-03 VITALS — BP 122/84 | HR 75 | Ht 65.0 in | Wt 234.0 lb

## 2023-11-03 DIAGNOSIS — L900 Lichen sclerosus et atrophicus: Secondary | ICD-10-CM

## 2023-11-03 NOTE — Progress Notes (Signed)
 Follow up appointment  Response to temovate   Chief Complaint  Patient presents with   Follow-up    Blood pressure 122/84, pulse 75, height 5' 5 (1.651 m), weight 234 lb (106.1 kg).  Exam complete resolution of her areas or erythema and white plaques  Pt states her symptoms have completely resolved  MEDS ordered this encounter: No orders of the defined types were placed in this encounter.   Orders for this encounter: No orders of the defined types were placed in this encounter.   Impression + Management Plan   ICD-10-CM   1. Lichen sclerosus: reduce use to nightly from BID, recheck 6 months  L90.0       Follow Up: Return in about 6 months (around 05/02/2024) for Follow up, with Dr Jayne.     All questions were answered.  Past Medical History:  Diagnosis Date   Anemia    Essential hypertension    Hyperlipidemia    Seasonal allergies     Past Surgical History:  Procedure Laterality Date   BIOPSY  04/29/2022   Procedure: BIOPSY;  Surgeon: Cindie Carlin POUR, DO;  [facility];  Service: Endoscopy;;   COLONOSCOPY WITH PROPOFOL  N/A 04/29/2022   Procedure: COLONOSCOPY WITH PROPOFOL ;  Surgeon: Cindie Carlin POUR, DO;  [facility];  Service: Endoscopy;  Laterality: N/A;  2:00pm   ESOPHAGOGASTRODUODENOSCOPY (EGD) WITH PROPOFOL  N/A 04/29/2022   Procedure: ESOPHAGOGASTRODUODENOSCOPY (EGD) WITH PROPOFOL ;  Surgeon: Cindie Carlin POUR, DO;  [facility];  Service: Endoscopy;  Laterality: N/A;   KNEE ARTHROSCOPY WITH SUBCHONDROPLASTY Right 06/20/2023   Procedure: KNEE ARTHROSCOPY, PARTIAL MEDIAL MENISCECTOMY WITH MEDIAL TIBIA SUBCHONDROPLASTY;  Surgeon: Sharl Selinda Dover, MD;  Location: [facility];  Service: Orthopedics;  Laterality: Right;   LAPAROSCOPIC BILATERAL SALPINGECTOMY Bilateral 05/01/2022   Procedure: LAPAROSCOPIC BILATERAL SALPINGECTOMY;  Surgeon: Jayne Vonn DEL, MD;  Location: [facility];  Service: Gynecology;  Laterality:  Bilateral;   MASS EXCISION Right 08/04/2017   Procedure: EXCISION OF RIGHT NASAL  MASS;  Surgeon: Karis Clunes, MD;  Location: [facility];  Service: ENT;  Laterality: Right;  LOCAL   POLYPECTOMY  04/29/2022   Procedure: POLYPECTOMY;  Surgeon: Cindie Carlin POUR, DO;  [facility];  Service: Endoscopy;;   REMOVAL OF NON VAGINAL CONTRACEPTIVE DEVICE N/A 05/01/2022   Procedure: REMOVAL OF NON VAGINAL CONTRACEPTIVE DEVICE;  Surgeon: Jayne Vonn DEL, MD;  Location: [facility];  Service: Gynecology;  Laterality: N/A;   WRIST SURGERY      OB History     Gravida  3   Para  2   Term  2   Preterm      AB  1   Living  2      SAB      IAB      Ectopic      Multiple      Live Births  1           No Known Allergies  Social History   Socioeconomic History   Marital status: Married    Spouse name: Selinda   Number of children: 2   Years of education: 14   Highest education level: GED or equivalent  Occupational History   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  Tobacco Use   Smoking status: Former    Current packs/day: 0.50    Types: Cigarettes    Passive exposure: Current   Smokeless tobacco: Never  Vaping Use  Vaping status: Never Used  Substance and Sexual Activity   Alcohol use: No   Drug use: No   Sexual activity: Not Currently    Birth control/protection: Surgical    Comment: tubal  Other Topics Concern   Not on file  Social History Narrative   Lives daughter Verna Kand is in college, home some weekends   Currently separated from husband   Social Drivers of Health   Financial Resource Strain: Low Risk  (09/12/2023)   Overall Financial Resource Strain (CARDIA)    Difficulty of Paying Living Expenses: Not hard at all  Food Insecurity: No Food Insecurity (09/12/2023)   Hunger Vital Sign    Worried About Running Out of Food in the Last Year: Never true    Ran Out of Food in the Last Year: Never true  Transportation Needs:  No Transportation Needs (09/12/2023)   PRAPARE - Administrator, Civil Service (Medical): No    Lack of Transportation (Non-Medical): No  Physical Activity: Insufficiently Active (09/12/2023)   Exercise Vital Sign    Days of Exercise per Week: 2 days    Minutes of Exercise per Session: 30 min  Stress: No Stress Concern Present (09/12/2023)   [employer] of Occupational Health - Occupational Stress Questionnaire    Feeling of Stress : Not at all  Social Connections: Moderately Integrated (09/12/2023)   Social Connection and Isolation Panel [NHANES]    Frequency of Communication with Friends and Family: More than three times a week    Frequency of Social Gatherings with Friends and Family: Once a week    Attends Religious Services: 1 to 4 times per year    Active Member of [employer] or Organizations: No    Attends Banker Meetings: Never    Marital Status: Married    Family History  Problem Relation Age of Onset   Diabetes Mother    Hypertension Mother    Arthritis Mother    Hyperlipidemia Mother    Stroke Mother    Hypertension Father    Heart disease Father 2       Valve replacement   Hyperlipidemia Father    Asthma Daughter    Hypertension Brother    Stroke Maternal Grandmother    Allergies Daughter

## 2023-11-04 ENCOUNTER — Other Ambulatory Visit: Payer: Self-pay | Admitting: Family Medicine

## 2023-11-04 DIAGNOSIS — I10 Essential (primary) hypertension: Secondary | ICD-10-CM

## 2023-11-18 ENCOUNTER — Ambulatory Visit
Admission: EM | Admit: 2023-11-18 | Discharge: 2023-11-18 | Disposition: A | Discharge status: Home / Self Care | Payer: No Typology Code available for payment source | Attending: Nurse Practitioner | Admitting: Nurse Practitioner

## 2023-11-18 ENCOUNTER — Encounter: Payer: Self-pay | Admitting: Emergency Medicine

## 2023-11-18 DIAGNOSIS — L089 Local infection of the skin and subcutaneous tissue, unspecified: Secondary | ICD-10-CM | POA: Diagnosis not present

## 2023-11-18 DIAGNOSIS — L739 Follicular disorder, unspecified: Secondary | ICD-10-CM | POA: Diagnosis not present

## 2023-11-18 MED ORDER — CEPHALEXIN 500 MG PO CAPS: 500.0000 mg | ORAL_CAPSULE | Freq: Four times a day (QID) | ORAL | 0 refills | Status: DC

## 2023-11-18 NOTE — ED Provider Notes (Signed)
[facility]    CSN: [id number] Arrival date & time: 11/18/23  1754      History   Chief Complaint No chief complaint on file.   HPI Miranda Wade is a 43 y.o. female.   The history is provided by the patient.   Patient presents for complaints of soreness to her scalp and a "knot" on the right side of her neck.  Patient states that she noticed the soreness to her scalp over the past several days, with the knot on the right side of her neck this morning.  She states that her scalp has been sore and tender.  She denies fever, chills, chest pain, abdominal pain, nausea, vomiting, diarrhea, or rash.  Patient states that the area in her scalp has become more painful and more sore.  She has not taken any medication for her symptoms.  Past Medical History:  Diagnosis Date   Anemia    Essential hypertension    Hyperlipidemia    Seasonal allergies     Patient Active Problem List   Diagnosis Date Noted   Diarrhea 05/25/2023   GERD (gastroesophageal reflux disease) 01/02/2023   LVH (left ventricular hypertrophy) 07/21/2017   Essential hypertension, benign 04/30/2016   Morbid obesity (HCC) 04/30/2016   Hyperlipidemia 04/30/2016    Past Surgical History:  Procedure Laterality Date   BIOPSY  04/29/2022   Procedure: BIOPSY;  Surgeon: Lanelle Bal, DO;  [facility];  Service: Endoscopy;;   COLONOSCOPY WITH PROPOFOL N/A 04/29/2022   Procedure: COLONOSCOPY WITH PROPOFOL;  Surgeon: Lanelle Bal, DO;  [facility];  Service: Endoscopy;  Laterality: N/A;  2:00pm   ESOPHAGOGASTRODUODENOSCOPY (EGD) WITH PROPOFOL N/A 04/29/2022   Procedure: ESOPHAGOGASTRODUODENOSCOPY (EGD) WITH PROPOFOL;  Surgeon: Lanelle Bal, DO;  [facility];  Service: Endoscopy;  Laterality: N/A;   KNEE ARTHROSCOPY WITH SUBCHONDROPLASTY Right 06/20/2023   Procedure: KNEE ARTHROSCOPY, PARTIAL MEDIAL MENISCECTOMY WITH MEDIAL TIBIA SUBCHONDROPLASTY;  Surgeon:  Yolonda Kida, MD;  Location: [facility];  Service: Orthopedics;  Laterality: Right;   LAPAROSCOPIC BILATERAL SALPINGECTOMY Bilateral 05/01/2022   Procedure: LAPAROSCOPIC BILATERAL SALPINGECTOMY;  Surgeon: Lazaro Arms, MD;  Location: [facility];  Service: Gynecology;  Laterality: Bilateral;   MASS EXCISION Right 08/04/2017   Procedure: EXCISION OF RIGHT NASAL  MASS;  Surgeon: Newman Pies, MD;  Location: [facility];  Service: ENT;  Laterality: Right;  LOCAL   POLYPECTOMY  04/29/2022   Procedure: POLYPECTOMY;  Surgeon: Lanelle Bal, DO;  [facility];  Service: Endoscopy;;   REMOVAL OF NON VAGINAL CONTRACEPTIVE DEVICE N/A 05/01/2022   Procedure: REMOVAL OF NON VAGINAL CONTRACEPTIVE DEVICE;  Surgeon: Lazaro Arms, MD;  Location: [facility];  Service: Gynecology;  Laterality: N/A;   WRIST SURGERY      OB History     Gravida  3   Para  2   Term  2   Preterm      AB  1   Living  2      SAB      IAB      Ectopic      Multiple      Live Births  1            Home Medications    Prior to Admission medications   Medication Sig Start Date End Date Taking? Authorizing Provider  cephALEXin (KEFLEX) 500 MG capsule Take 1 capsule (500 mg total) by mouth  4 (four) times daily for 5 days. 11/18/23 11/23/23 Yes Leath-Warren, Sadie Haber, NP  Ascorbic Acid (VITAMIN C PO) Take 1 tablet by mouth daily.    [provider]  atorvastatin (LIPITOR) 80 MG tablet Take 1 tablet by mouth once daily 10/20/23   Cook, Jayce G, DO  clobetasol cream (TEMOVATE) 0.05 % Apply 1 Application topically 2 (two) times daily. 10/03/23   Lazaro Arms, MD  ferrous sulfate 325 (65 FE) MG tablet Take 325 mg by mouth daily with breakfast.    [provider]  fluticasone (FLONASE) 50 MCG/ACT nasal spray Place 2 sprays into both nostrils daily. 10/08/22   Leath-Warren, Sadie Haber, NP  hydrochlorothiazide (HYDRODIURIL) 25 MG tablet Take 1 tablet (25 mg  total) by mouth daily. 04/18/23   Tommie Sams, DO  ibuprofen (ADVIL) 600 MG tablet Take 600 mg by mouth every 6 (six) hours as needed for moderate pain.    [provider]  losartan (COZAAR) 100 MG tablet Take 1 tablet by mouth once daily 11/04/23   Tommie Sams, DO  Omega-3 Fatty Acids (FISH OIL PO) Take 1 capsule by mouth daily.    [provider]  pantoprazole (PROTONIX) 40 MG tablet Take 1 tablet by mouth once daily 11/03/23   Tommie Sams, DO    Family History Family History  Problem Relation Age of Onset   Diabetes Mother    Hypertension Mother    Arthritis Mother    Hyperlipidemia Mother    Stroke Mother    Hypertension Father    Heart disease Father 68       Valve replacement   Hyperlipidemia Father    Asthma Daughter    Hypertension Brother    Stroke Maternal Grandmother    Allergies Daughter     Social History Social History   Tobacco Use   Smoking status: Former    Current packs/day: 0.50    Types: Cigarettes    Passive exposure: Current   Smokeless tobacco: Never  Vaping Use   Vaping status: Never Used  Substance Use Topics   Alcohol use: No   Drug use: No     Allergies   Patient has no known allergies.   Review of Systems Review of Systems Per HPI  Physical Exam Triage Vital Signs ED Triage Vitals  Encounter Vitals Group     BP 11/18/23 1804 126/84     Systolic BP Percentile --      Diastolic BP Percentile --      Pulse Rate 11/18/23 1804 81     Resp 11/18/23 1804 18     Temp 11/18/23 1804 98.7 F (37.1 C)     Temp Source 11/18/23 1804 Oral     SpO2 11/18/23 1804 98 %     Weight --      Height --      Head Circumference --      Peak Flow --      Pain Score 11/18/23 1805 4     Pain Loc --      Pain Education --      Exclude from Growth Chart --    No data found.  Updated Vital Signs BP 126/84 (BP Location: Right Arm)   Pulse 81   Temp 98.7 F (37.1 C) (Oral)   Resp 18   LMP 10/30/2023 (Exact Date)   SpO2  98%   Visual Acuity Right Eye Distance:   Left Eye Distance:   Bilateral Distance:  Right Eye Near:   Left Eye Near:    Bilateral Near:     Physical Exam Vitals and nursing note reviewed.  Constitutional:      General: She is not in acute distress.    Appearance: Normal appearance.  HENT:     Head: Normocephalic.   Eyes:     Extraocular Movements: Extraocular movements intact.     Pupils: Pupils are equal, round, and reactive to light.  Pulmonary:     Effort: Pulmonary effort is normal.  Musculoskeletal:     Cervical back: Normal range of motion.  Lymphadenopathy:     Cervical: Cervical adenopathy present.     Right cervical: Superficial cervical adenopathy present.  Skin:    General: Skin is warm and dry.  Neurological:     General: No focal deficit present.     Mental Status: She is alert and oriented to person, place, and time.  Psychiatric:        Mood and Affect: Mood normal.        Behavior: Behavior normal.      UC Treatments / Results  Labs (all labs ordered are listed, but only abnormal results are displayed) Labs Reviewed - No data to display  EKG   Radiology No results found.  Procedures Procedures (including critical care time)  Medications Ordered in UC Medications - No data to display  Initial Impression / Assessment and Plan / UC Course  I have reviewed the triage vital signs and the nursing notes.  Pertinent labs & imaging results that were available during my care of the patient were reviewed by me and considered in my medical decision making (see chart for details).  On exam, patient with an area of induration, erythema, and tenderness to the right upper submental scalp.  Symptoms consistent with folliculitis.  Will treat empirically with Keflex 500 mg 4 times daily for the next 5 days.  Supportive care recommendations were provided and discussed with the patient to include over-the-counter analgesics, warm compresses to the affected  area, and keeping the scalp clean and dry.  With regard to the lymph node on the right neck, suspect symptoms may be derived from the infection in the patient's scalp.  Patient was advised to monitor the lymph node for increased size, or if symptoms do not improve while the scalp infection is improving.  Discussed indications with the patient regarding follow-up.  Patient was in agreement with this plan of care and verbalizes understanding.  All questions were answered.  Patient stable for discharge.  Final Clinical Impressions(s) / UC Diagnoses   Final diagnoses:  Infection of scalp  Folliculitis     Discharge Instructions      Take medication as prescribed. May take OTC Tylenol or Ibuprofen as needed for pain or discomfort. Apply warm compresses to the affected areas to help with pain or discomfort. Keep the scalp clean and dry. Monitor the area for worsening. If symptoms worsen to include increased redness, swelling, or pain, you may follow-up in this clinic or with your PCP for further evaluation. Cleanse the affected area of the scalp with an antibacterial soap such as Dial Gold (gold bar). Continue to monitor the lymph node in your right neck. If the areas begins to increase in size or fails to resolve over the next 1-2 weeks, follow-up with your PCP for further evaluation. Follow-up as needed.     ED Prescriptions     Medication Sig Dispense Auth. Provider   cephALEXin (KEFLEX) 500  MG capsule Take 1 capsule (500 mg total) by mouth 4 (four) times daily for 5 days. 20 capsule Leath-Warren, Sadie Haber, NP      PDMP not reviewed this encounter.   Abran Cantor, NP 11/18/23 [phone]

## 2023-11-18 NOTE — ED Triage Notes (Signed)
Knot on back of head that she noticed this morning.  States she also has a knot on the right side of her neck.  States the knot on her head hurts

## 2023-11-18 NOTE — Discharge Instructions (Addendum)
Take medication as prescribed. May take OTC Tylenol or Ibuprofen as needed for pain or discomfort. Apply warm compresses to the affected areas to help with pain or discomfort. Keep the scalp clean and dry. Monitor the area for worsening. If symptoms worsen to include increased redness, swelling, or pain, you may follow-up in this clinic or with your PCP for further evaluation. Cleanse the affected area of the scalp with an antibacterial soap such as Dial Gold (gold bar). Continue to monitor the lymph node in your right neck. If the areas begins to increase in size or fails to resolve over the next 1-2 weeks, follow-up with your PCP for further evaluation. Follow-up as needed.

## 2023-11-19 ENCOUNTER — Encounter (HOSPITAL_COMMUNITY): Payer: Self-pay | Admitting: *Deleted

## 2023-11-19 ENCOUNTER — Emergency Department (HOSPITAL_COMMUNITY)
Admission: EM | Admit: 2023-11-19 | Discharge: 2023-11-19 | Disposition: A | Discharge status: Home / Self Care | Payer: No Typology Code available for payment source | Attending: Emergency Medicine | Admitting: Emergency Medicine

## 2023-11-19 ENCOUNTER — Other Ambulatory Visit: Payer: Self-pay

## 2023-11-19 DIAGNOSIS — L739 Follicular disorder, unspecified: Secondary | ICD-10-CM | POA: Insufficient documentation

## 2023-11-19 MED ORDER — HYDROCODONE-ACETAMINOPHEN 5-325 MG PO TABS: 1.0000 | ORAL_TABLET | Freq: Four times a day (QID) | ORAL | 0 refills | Status: DC | PRN

## 2023-11-19 MED ORDER — HYDROCODONE-ACETAMINOPHEN 5-325 MG PO TABS
1.0000 | ORAL_TABLET | Freq: Once | ORAL | Status: AC
  Administered 2023-11-19: 1 via ORAL
  Filled 2023-11-19: qty 1

## 2023-11-19 MED ORDER — DOXYCYCLINE HYCLATE 100 MG PO CAPS: 100.0000 mg | ORAL_CAPSULE | Freq: Two times a day (BID) | ORAL | 0 refills | Status: DC

## 2023-11-19 MED ORDER — DOXYCYCLINE HYCLATE 100 MG PO TABS
100.0000 mg | ORAL_TABLET | Freq: Once | ORAL | Status: AC
  Administered 2023-11-19: 100 mg via ORAL
  Filled 2023-11-19: qty 1

## 2023-11-19 NOTE — ED Provider Notes (Signed)
[place] EMERGENCY DEPARTMENT AT [facility]  Provider Note  CSN: [id number] Arrival date & time: 11/19/23 0321  History Chief Complaint  Patient presents with   Abscess    Miranda Wade is a 43 y.o. female here with a bump on the R occipital scalp worsening for several days, seen at [facility] on the evening of 1/28 and given Rx for Keflex for folliculitis. Symptoms have been worsening overnight prompting her ED visit. No fever no drainage.    Home Medications Prior to Admission medications   Medication Sig Start Date End Date Taking? Authorizing Provider  doxycycline (VIBRAMYCIN) 100 MG capsule Take 1 capsule (100 mg total) by mouth 2 (two) times daily. 11/19/23  Yes Pollyann Savoy, MD  HYDROcodone-acetaminophen (NORCO/VICODIN) 5-325 MG tablet Take 1 tablet by mouth every 6 (six) hours as needed for severe pain (pain score 7-10). 11/19/23  Yes Pollyann Savoy, MD  Ascorbic Acid (VITAMIN C PO) Take 1 tablet by mouth daily.    [provider]  atorvastatin (LIPITOR) 80 MG tablet Take 1 tablet by mouth once daily 10/20/23   Cook, Jayce G, DO  clobetasol cream (TEMOVATE) 0.05 % Apply 1 Application topically 2 (two) times daily. 10/03/23   Lazaro Arms, MD  ferrous sulfate 325 (65 FE) MG tablet Take 325 mg by mouth daily with breakfast.    [provider]  fluticasone (FLONASE) 50 MCG/ACT nasal spray Place 2 sprays into both nostrils daily. 10/08/22   Leath-Warren, Sadie Haber, NP  hydrochlorothiazide (HYDRODIURIL) 25 MG tablet Take 1 tablet (25 mg total) by mouth daily. 04/18/23   Tommie Sams, DO  ibuprofen (ADVIL) 600 MG tablet Take 600 mg by mouth every 6 (six) hours as needed for moderate pain.    [provider]  losartan (COZAAR) 100 MG tablet Take 1 tablet by mouth once daily 11/04/23   Tommie Sams, DO  Omega-3 Fatty Acids (FISH OIL PO) Take 1 capsule by mouth daily.    [provider]  pantoprazole (PROTONIX) 40 MG tablet Take 1  tablet by mouth once daily 11/03/23   Tommie Sams, DO     Allergies    Patient has no known allergies.   Review of Systems   Review of Systems Please see HPI for pertinent positives and negatives  Physical Exam BP (!) 138/91 (BP Location: Right Arm)   Pulse 81   Temp 98.3 F (36.8 C) (Oral)   Resp 16   Ht 5\' 5"  (1.651 m)   Wt 106.1 kg   LMP 10/30/2023 (Exact Date)   SpO2 97%   BMI 38.94 kg/m   Physical Exam Vitals and nursing note reviewed.  HENT:     Head: Normocephalic.     Comments: Small area of induration, erythema and tenderness in R occipital scalp, no focal fluctuance or fluid collection on [place].     Nose: Nose normal.  Eyes:     Extraocular Movements: Extraocular movements intact.  Pulmonary:     Effort: Pulmonary effort is normal.  Musculoskeletal:        General: Normal range of motion.     Cervical back: Neck supple.  Skin:    Findings: No rash (on exposed skin).  Neurological:     Mental Status: She is alert and oriented to person, place, and time.  Psychiatric:        Mood and Affect: Mood normal.     ED Results / Procedures / Treatments  EKG None  Procedures Procedures  Medications Ordered in the ED Medications  doxycycline (VIBRA-TABS) tablet 100 mg (has no administration in time range)  HYDROcodone-acetaminophen (NORCO/VICODIN) 5-325 MG per tablet 1 tablet (has no administration in time range)    Initial Impression and Plan  Patient here with folliculitis, started on Keflex a few hours prior to arrival, but I suspect doxycycline may cover more potential etiologies and so will switch to that. Norco for pain, PCP follow up, RTED for any other concerns.    ED Course       MDM Rules/Calculators/A&P Medical Decision Making Problems Addressed: Folliculitis: acute illness or injury  Risk Prescription drug management.     Final Clinical Impression(s) / ED Diagnoses Final diagnoses:  Folliculitis    Rx / DC Orders ED  Discharge Orders          Ordered    doxycycline (VIBRAMYCIN) 100 MG capsule  2 times daily        11/19/23 0518    HYDROcodone-acetaminophen (NORCO/VICODIN) 5-325 MG tablet  Every 6 hours PRN        11/19/23 0518             Pollyann Savoy, MD 11/19/23 [phone]

## 2023-11-19 NOTE — ED Triage Notes (Signed)
Pt c/o large bump to right side of head x 3 days; pt states she was seen at urgent care and given antibiotics but pt states the knot has become bigger and more painful and states she is unable to sleep due to the pain  Pt denies any drainage from area

## 2023-11-24 ENCOUNTER — Encounter: Payer: Self-pay | Admitting: Physician Assistant

## 2023-11-24 ENCOUNTER — Ambulatory Visit: Payer: No Typology Code available for payment source | Admitting: Family Medicine

## 2023-11-24 ENCOUNTER — Ambulatory Visit (INDEPENDENT_AMBULATORY_CARE_PROVIDER_SITE_OTHER): Payer: No Typology Code available for payment source | Admitting: Physician Assistant

## 2023-11-24 VITALS — BP 130/76 | HR 71 | Temp 99.5°F | Ht 65.0 in | Wt 224.8 lb

## 2023-11-24 DIAGNOSIS — I10 Essential (primary) hypertension: Secondary | ICD-10-CM | POA: Diagnosis not present

## 2023-11-24 DIAGNOSIS — L439 Lichen planus, unspecified: Secondary | ICD-10-CM | POA: Insufficient documentation

## 2023-11-24 DIAGNOSIS — R590 Localized enlarged lymph nodes: Secondary | ICD-10-CM | POA: Insufficient documentation

## 2023-11-24 MED ORDER — HYDROCHLOROTHIAZIDE 25 MG PO TABS: 25.0000 mg | ORAL_TABLET | Freq: Every day | ORAL | 3 refills | Status: DC

## 2023-11-24 MED ORDER — ATORVASTATIN CALCIUM 80 MG PO TABS: 80.0000 mg | ORAL_TABLET | Freq: Every day | ORAL | 0 refills | Status: DC

## 2023-11-24 MED ORDER — PANTOPRAZOLE SODIUM 40 MG PO TBEC: 40.0000 mg | DELAYED_RELEASE_TABLET | Freq: Every day | ORAL | 0 refills | Status: DC

## 2023-11-24 MED ORDER — LOSARTAN POTASSIUM 100 MG PO TABS: 100.0000 mg | ORAL_TABLET | Freq: Every day | ORAL | 0 refills | Status: DC

## 2023-11-24 NOTE — Assessment & Plan Note (Signed)
130/76. Controlled. Continue current medications. No change in management. Lab work today.

## 2023-11-24 NOTE — Progress Notes (Cosign Needed)
Established Patient Office Visit  Subjective   Patient ID: Miranda Wade, female    DOB: May 03, 1981  Age: 43 y.o. MRN: [id number]  No chief complaint on file.   HPI  Patient presents today for medication refills and lab work. Patient states daily compliance with medications and denies chest pain, SOB, visual changes or headaches. She states history of heart murmur.   Patient with concerns for swollen cervical lymph node on right side of neck. She denies pain or redness. She states this began about 1 week ago and symptoms have slightly improved.   Patient also with concerns of rash/dry skin on bilateral thenar eminences. Patient with history of lichen planus. She denies fevers or pruritus.   Review of Systems  Constitutional:  Negative for chills, fever and malaise/fatigue.  Eyes:  Negative for blurred vision and double vision.  Respiratory:  Negative for cough and shortness of breath.   Cardiovascular:  Negative for chest pain and palpitations.  Musculoskeletal:  Negative for myalgias.  Skin:  Positive for rash (history of lichen planus).  Neurological:  Negative for headaches.      Objective:     BP 130/76   Pulse 71   Temp 99.5 F (37.5 C)   Ht 5\' 5"  (1.651 m)   Wt 224 lb 12.8 oz (102 kg)   LMP 10/30/2023 (Exact Date)   SpO2 100%   BMI 37.41 kg/m    Physical Exam Constitutional:      Appearance: Normal appearance. She is obese.  HENT:     Head: Normocephalic.     Mouth/Throat:     Mouth: Mucous membranes are moist.     Pharynx: Oropharynx is clear.  Eyes:     Extraocular Movements: Extraocular movements intact.     Conjunctiva/sclera: Conjunctivae normal.  Cardiovascular:     Rate and Rhythm: Normal rate and regular rhythm.     Heart sounds: Murmur heard.     No gallop.  Pulmonary:     Effort: Pulmonary effort is normal.     Breath sounds: No wheezing, rhonchi or rales.  Musculoskeletal:     Right lower leg: No edema.     Left lower leg: No edema.   Skin:    General: Skin is warm and dry.  Neurological:     General: No focal deficit present.     Mental Status: She is alert and oriented to person, place, and time.  Psychiatric:        Mood and Affect: Mood normal.        Behavior: Behavior normal.      No results found for any visits on 11/24/23.  The 10-year ASCVD risk score (Arnett DK, et al., 2019) is: 3.7%    Assessment & Plan:   Return in about 6 months (around 05/23/2024).   Essential hypertension, benign Assessment & Plan: 130/76. Controlled. Continue current medications. No change in management. Lab work today.   Orders: -     CMP14+EGFR -     CBC with Differential/Platelet -     Lipid panel -     Atorvastatin Calcium; Take 1 tablet (80 mg total) by mouth daily.  Dispense: 30 tablet; Refill: 0 -     hydroCHLOROthiazide; Take 1 tablet (25 mg total) by mouth daily.  Dispense: 90 tablet; Refill: 3 -     Losartan Potassium; Take 1 tablet (100 mg total) by mouth daily.  Dispense: 30 tablet; Refill: 0  Lichen planus Assessment & Plan: Stable. Advised use  of clobetasol cream on affected area as needed until resolved. Patient follow up with me or OBGYN for persistent lichen planus symptoms.    Enlarged lymph node in neck Assessment & Plan: Singular enlarged cervical lymph node on right front neck. Non tender, mobile. CBC today. Patient reports lymph node is slowly returning back to normal size. Will follow up based on lab work. Otherwise patient to follow up as needed or if increased swelling or pain occurs.    Other orders -     Pantoprazole Sodium; Take 1 tablet (40 mg total) by mouth daily.  Dispense: 30 tablet; Refill: 0    Marchelle Rinella, PA-C

## 2023-11-24 NOTE — Assessment & Plan Note (Signed)
Singular enlarged cervical lymph node on right front neck. Non tender, mobile. CBC today. Patient reports lymph node is slowly returning back to normal size. Will follow up based on lab work. Otherwise patient to follow up as needed or if increased swelling or pain occurs.

## 2023-11-24 NOTE — Assessment & Plan Note (Signed)
Stable. Advised use of clobetasol cream on affected area as needed until resolved. Patient follow up with me or OBGYN for persistent lichen planus symptoms.

## 2023-11-25 ENCOUNTER — Encounter: Payer: Self-pay | Admitting: Physician Assistant

## 2023-11-25 LAB — CMP14+EGFR
ALT: 14 [IU]/L (ref 0–32)
AST: 13 [IU]/L (ref 0–40)
Albumin: 4.2 g/dL (ref 3.9–4.9)
Alkaline Phosphatase: 106 [IU]/L (ref 44–121)
BUN/Creatinine Ratio: 13 (ref 9–23)
BUN: 9 mg/dL (ref 6–24)
Bilirubin Total: 0.3 mg/dL (ref 0.0–1.2)
CO2: 24 mmol/L (ref 20–29)
Calcium: 9.5 mg/dL (ref 8.7–10.2)
Chloride: 100 mmol/L (ref 96–106)
Creatinine, Ser: 0.67 mg/dL (ref 0.57–1.00)
Globulin, Total: 3.4 g/dL (ref 1.5–4.5)
Glucose: 112 mg/dL — ABNORMAL HIGH (ref 70–99)
Potassium: 3.4 mmol/L — ABNORMAL LOW (ref 3.5–5.2)
Sodium: 142 mmol/L (ref 134–144)
Total Protein: 7.6 g/dL (ref 6.0–8.5)
eGFR: 111 mL/min/{1.73_m2} (ref >59)

## 2023-11-25 LAB — CBC WITH DIFFERENTIAL/PLATELET
Basophils Absolute: 0 10*3/uL (ref 0.0–0.2)
Basos: 1 %
EOS (ABSOLUTE): 0.1 10*3/uL (ref 0.0–0.4)
Eos: 1 %
Hematocrit: 39 % (ref 34.0–46.6)
Hemoglobin: 12.3 g/dL (ref 11.1–15.9)
Immature Grans (Abs): 0 10*3/uL (ref 0.0–0.1)
Immature Granulocytes: 0 %
Lymphocytes Absolute: 1.3 10*3/uL (ref 0.7–3.1)
Lymphs: 21 %
MCH: 27.1 pg (ref 26.6–33.0)
MCHC: 31.5 g/dL (ref 31.5–35.7)
MCV: 86 fL (ref 79–97)
Monocytes Absolute: 0.6 10*3/uL (ref 0.1–0.9)
Monocytes: 9 %
Neutrophils Absolute: 4.3 10*3/uL (ref 1.4–7.0)
Neutrophils: 68 %
Platelets: 332 10*3/uL (ref 150–450)
RBC: 4.54 x10E6/uL (ref 3.77–5.28)
RDW: 14.1 % (ref 11.7–15.4)
WBC: 6.3 10*3/uL (ref 3.4–10.8)

## 2023-11-25 LAB — LIPID PANEL
Chol/HDL Ratio: 4.9 {ratio} — ABNORMAL HIGH (ref 0.0–4.4)
Cholesterol, Total: 195 mg/dL (ref 100–199)
HDL: 40 mg/dL (ref >39)
LDL Chol Calc (NIH): 143 mg/dL — ABNORMAL HIGH (ref 0–99)
Triglycerides: 66 mg/dL (ref 0–149)
VLDL Cholesterol Cal: 12 mg/dL (ref 5–40)

## 2023-11-27 ENCOUNTER — Emergency Department (HOSPITAL_BASED_OUTPATIENT_CLINIC_OR_DEPARTMENT_OTHER): Payer: No Typology Code available for payment source

## 2023-11-27 ENCOUNTER — Other Ambulatory Visit: Payer: Self-pay

## 2023-11-27 ENCOUNTER — Encounter (HOSPITAL_BASED_OUTPATIENT_CLINIC_OR_DEPARTMENT_OTHER): Payer: Self-pay | Admitting: Emergency Medicine

## 2023-11-27 ENCOUNTER — Emergency Department (HOSPITAL_BASED_OUTPATIENT_CLINIC_OR_DEPARTMENT_OTHER)
Admission: EM | Admit: 2023-11-27 | Discharge: 2023-11-28 | Disposition: A | Discharge status: Home / Self Care | Payer: No Typology Code available for payment source | Attending: Emergency Medicine | Admitting: Emergency Medicine

## 2023-11-27 DIAGNOSIS — R109 Unspecified abdominal pain: Secondary | ICD-10-CM | POA: Diagnosis present

## 2023-11-27 DIAGNOSIS — Z79899 Other long term (current) drug therapy: Secondary | ICD-10-CM | POA: Diagnosis not present

## 2023-11-27 DIAGNOSIS — R197 Diarrhea, unspecified: Secondary | ICD-10-CM | POA: Insufficient documentation

## 2023-11-27 DIAGNOSIS — I10 Essential (primary) hypertension: Secondary | ICD-10-CM | POA: Diagnosis not present

## 2023-11-27 LAB — BASIC METABOLIC PANEL
Anion gap: 11 (ref 5–15)
BUN: 8 mg/dL (ref 6–20)
CO2: 27 mmol/L (ref 22–32)
Calcium: 9.3 mg/dL (ref 8.9–10.3)
Chloride: 101 mmol/L (ref 98–111)
Creatinine, Ser: 0.79 mg/dL (ref 0.44–1.00)
GFR, Estimated: >60 mL/min (ref >60)
Glucose, Bld: 102 mg/dL — ABNORMAL HIGH (ref 70–99)
Potassium: 2.7 mmol/L — CL (ref 3.5–5.1)
Sodium: 139 mmol/L (ref 135–145)

## 2023-11-27 LAB — CBC WITH DIFFERENTIAL/PLATELET
Abs Immature Granulocytes: 0.03 10*3/uL (ref 0.00–0.07)
Basophils Absolute: 0 10*3/uL (ref 0.0–0.1)
Basophils Relative: 0 %
Eosinophils Absolute: 0.2 10*3/uL (ref 0.0–0.5)
Eosinophils Relative: 2 %
HCT: 34.2 % — ABNORMAL LOW (ref 36.0–46.0)
Hemoglobin: 11.5 g/dL — ABNORMAL LOW (ref 12.0–15.0)
Immature Granulocytes: 0 %
Lymphocytes Relative: 22 %
Lymphs Abs: 1.9 10*3/uL (ref 0.7–4.0)
MCH: 27.5 pg (ref 26.0–34.0)
MCHC: 33.6 g/dL (ref 30.0–36.0)
MCV: 81.8 fL (ref 80.0–100.0)
Monocytes Absolute: 0.8 10*3/uL (ref 0.1–1.0)
Monocytes Relative: 9 %
Neutro Abs: 5.9 10*3/uL (ref 1.7–7.7)
Neutrophils Relative %: 67 %
Platelets: 310 10*3/uL (ref 150–400)
RBC: 4.18 MIL/uL (ref 3.87–5.11)
RDW: 14.4 % (ref 11.5–15.5)
WBC: 8.9 10*3/uL (ref 4.0–10.5)
nRBC: 0 % (ref 0.0–0.2)

## 2023-11-27 LAB — URINALYSIS, ROUTINE W REFLEX MICROSCOPIC
Bacteria, UA: NONE SEEN
Bilirubin Urine: NEGATIVE
Glucose, UA: NEGATIVE mg/dL
Leukocytes,Ua: NEGATIVE
Nitrite: NEGATIVE
Protein, ur: NEGATIVE mg/dL
Specific Gravity, Urine: 1.017 (ref 1.005–1.030)
pH: 6.5 (ref 5.0–8.0)

## 2023-11-27 LAB — MAGNESIUM: Magnesium: 1.6 mg/dL — ABNORMAL LOW (ref 1.7–2.4)

## 2023-11-27 LAB — WET PREP, GENITAL
Clue Cells Wet Prep HPF POC: NONE SEEN
Sperm: NONE SEEN
Trich, Wet Prep: NONE SEEN
WBC, Wet Prep HPF POC: <10 (ref <10)
Yeast Wet Prep HPF POC: NONE SEEN

## 2023-11-27 LAB — OCCULT BLOOD X 1 CARD TO LAB, STOOL: Fecal Occult Bld: NEGATIVE

## 2023-11-27 MED ORDER — IOHEXOL 300 MG/ML  SOLN
100.0000 mL | Freq: Once | INTRAMUSCULAR | Status: AC | PRN
  Administered 2023-11-27: 100 mL via INTRAVENOUS

## 2023-11-27 MED ORDER — POTASSIUM CHLORIDE CRYS ER 20 MEQ PO TBCR
40.0000 meq | EXTENDED_RELEASE_TABLET | Freq: Once | ORAL | Status: AC
  Administered 2023-11-27: 40 meq via ORAL
  Filled 2023-11-27: qty 2

## 2023-11-27 MED ORDER — POTASSIUM CHLORIDE CRYS ER 20 MEQ PO TBCR: 40.0000 meq | EXTENDED_RELEASE_TABLET | Freq: Two times a day (BID) | ORAL | 0 refills | Status: DC

## 2023-11-27 MED ORDER — SODIUM CHLORIDE 0.9 % IV BOLUS
1000.0000 mL | Freq: Once | INTRAVENOUS | Status: AC
  Administered 2023-11-27: 1000 mL via INTRAVENOUS

## 2023-11-27 MED ORDER — MAGNESIUM OXIDE -MG SUPPLEMENT 400 (240 MG) MG PO TABS
400.0000 mg | ORAL_TABLET | Freq: Once | ORAL | Status: AC
  Administered 2023-11-27: 400 mg via ORAL
  Filled 2023-11-27: qty 1

## 2023-11-27 MED ORDER — ONDANSETRON 4 MG PO TBDP: 4.0000 mg | ORAL_TABLET | Freq: Three times a day (TID) | ORAL | 0 refills | Status: DC | PRN

## 2023-11-27 MED ORDER — OXYCODONE HCL 5 MG PO TABS: 5.0000 mg | ORAL_TABLET | ORAL | 0 refills | Status: DC | PRN

## 2023-11-27 MED ORDER — POTASSIUM CHLORIDE 10 MEQ/100ML IV SOLN
10.0000 meq | INTRAVENOUS | Status: AC
  Administered 2023-11-27 (×2): 10 meq via INTRAVENOUS
  Filled 2023-11-27 (×2): qty 100

## 2023-11-27 MED ORDER — LORAZEPAM 2 MG/ML IJ SOLN
1.0000 mg | Freq: Once | INTRAMUSCULAR | Status: AC
  Administered 2023-11-27: 1 mg via INTRAVENOUS
  Filled 2023-11-27: qty 1

## 2023-11-27 NOTE — ED Provider Notes (Signed)
 43 year old female received in signout.  See previous note for full details.  Pending CT at the time of shift change.  Physical Exam  BP (!) 124/90   Pulse 76   Temp 98.8 F (37.1 C)   Resp 20   LMP 11/21/2023 (Exact Date)   SpO2 100%     Procedures  Procedures  ED Course / MDM    Medical Decision Making Amount and/or Complexity of Data Reviewed Labs: ordered. Radiology: ordered.  Risk OTC drugs. Prescription drug management.   CT shows air within the vagina.  Cannot rule out fistula.  Exam by previous provider did not show any evidence of perforation.  No evidence of feces in patient's vaginal vault.  Discussed follow-up with GYN.  She states she does have an established GYN.  Pain control prescribed.  Currently denies any significant pain.  Given her pharmacy will not be open until tomorrow will give 1 more dose of pain medicine prior to discharge.  Patient is stable for discharge. Initially plan to discuss patient's findings with the oncology but they are tied up with a C-section.  Discussed findings with my attending.  Feel that these findings do not necessitate urgent GYN consultation.  Patient is okay for follow-up outpatient.       Hildegard Loge, PA-C 11/27/23 2303    Lenor Hollering, MD 11/27/23 [phone]

## 2023-11-27 NOTE — ED Provider Notes (Signed)
 [place] EMERGENCY DEPARTMENT AT [facility] Provider Note   CSN: [id number] Arrival date & time: 11/27/23  1815     History  No chief complaint on file.   Miranda Wade is a 43 y.o. female with past medical history significant for hypertension, hyperlipidemia, lichen planus, obesity, GERD, diarrhea who presents with concern for abdominal pain, diarrhea, concerned about some blood from the stool.  She is currently on her menstrual cycle and endorsed concern for feces coming out of her vagina when she went to wipe earlier today.  She reports abdominal pain only for around 1 day.  Denies any fever, chills.  No previous history of fistula.  She has had a laparoscopic salpingectomy and polypectomy after colonoscopy in the past.  HPI     Home Medications Prior to Admission medications   Medication Sig Start Date End Date Taking? Authorizing Provider  Ascorbic Acid (VITAMIN C PO) Take 1 tablet by mouth daily.    [provider]  atorvastatin  (LIPITOR) 80 MG tablet Take 1 tablet (80 mg total) by mouth daily. 11/24/23   Grooms, Courtney, PA-C  clobetasol  cream (TEMOVATE ) 0.05 % Apply 1 Application topically 2 (two) times daily. 10/03/23   Jayne Vonn DEL, MD  doxycycline  (VIBRAMYCIN ) 100 MG capsule Take 1 capsule (100 mg total) by mouth 2 (two) times daily. 11/19/23   Roselyn Carlin NOVAK, MD  ferrous sulfate 325 (65 FE) MG tablet Take 325 mg by mouth daily with breakfast.    [provider]  fluticasone  (FLONASE ) 50 MCG/ACT nasal spray Place 2 sprays into both nostrils daily. 10/08/22   Leath-Warren, Etta PARAS, NP  hydrochlorothiazide  (HYDRODIURIL ) 25 MG tablet Take 1 tablet (25 mg total) by mouth daily. 11/24/23   Grooms, Courtney, PA-C  HYDROcodone -acetaminophen  (NORCO/VICODIN) 5-325 MG tablet Take 1 tablet by mouth every 6 (six) hours as needed for severe pain (pain score 7-10). 11/19/23   Roselyn Carlin NOVAK, MD  ibuprofen  (ADVIL ) 600 MG tablet Take 600 mg by mouth  every 6 (six) hours as needed for moderate pain.    [provider]  losartan  (COZAAR ) 100 MG tablet Take 1 tablet (100 mg total) by mouth daily. 11/24/23   Grooms, [place], PA-C  Omega-3 Fatty Acids (FISH OIL PO) Take 1 capsule by mouth daily.    [provider]  pantoprazole  (PROTONIX ) 40 MG tablet Take 1 tablet (40 mg total) by mouth daily. 11/24/23   Grooms, [place], PA-C      Allergies    Patient has no known allergies.    Review of Systems   Review of Systems  All other systems reviewed and are negative.   Physical Exam Updated Vital Signs BP (!) 145/96 (BP Location: Right Arm)   Pulse 82   Temp 98.8 F (37.1 C)   Resp 18   LMP 11/21/2023 (Exact Date)   SpO2 100%  Physical Exam Vitals and nursing note reviewed.  Constitutional:      General: She is not in acute distress.    Appearance: Normal appearance.  HENT:     Head: Normocephalic and atraumatic.  Eyes:     General:        Right eye: No discharge.        Left eye: No discharge.  Cardiovascular:     Rate and Rhythm: Normal rate and regular rhythm.     Heart sounds: No murmur heard.    No friction rub. No gallop.  Pulmonary:     Effort: Pulmonary effort is normal.  Breath sounds: Normal breath sounds.  Abdominal:     General: Bowel sounds are normal.     Palpations: Abdomen is soft.     Comments: Mild tenderness to palpation in the left flank, no anterior abdominal tenderness, no rebound, rigidity, guarding.  Genitourinary:    Comments: Slow dark blood coming from cervical os, no cervical motion tenderness.  No suprapubic or adnexal tenderness.  No significant vaginal discharge.  External rectum appears unremarkable, no active bleeding from the rectum, normal rectal tone, soft brown stool in rectal vault. Skin:    General: Skin is warm and dry.     Capillary Refill: Capillary refill takes less than 2 seconds.  Neurological:     Mental Status: She is alert and oriented to person, place,  and time.  Psychiatric:        Mood and Affect: Mood normal.        Behavior: Behavior normal.     ED Results / Procedures / Treatments   Labs (all labs ordered are listed, but only abnormal results are displayed) Labs Reviewed  CBC WITH DIFFERENTIAL/PLATELET - Abnormal; Notable for the following components:      Result Value   Hemoglobin 11.5 (*)    HCT 34.2 (*)    All other components within normal limits  BASIC METABOLIC PANEL - Abnormal; Notable for the following components:   Potassium 2.7 (*)    Glucose, Bld 102 (*)    All other components within normal limits  URINALYSIS, ROUTINE W REFLEX MICROSCOPIC - Abnormal; Notable for the following components:   Hgb urine dipstick MODERATE (*)    Ketones, ur TRACE (*)    All other components within normal limits  MAGNESIUM  - Abnormal; Notable for the following components:   Magnesium  1.6 (*)    All other components within normal limits  WET PREP, GENITAL  OCCULT BLOOD X 1 CARD TO LAB, STOOL  GC/CHLAMYDIA PROBE AMP ([place]) NOT AT [facility]    EKG None  Radiology CT ABDOMEN PELVIS W CONTRAST Result Date: 11/27/2023 CLINICAL DATA:  Acute nonlocalized abdominal pain. Feces in the vagina. Bleeding. EXAM: CT ABDOMEN AND PELVIS WITH CONTRAST TECHNIQUE: Multidetector CT imaging of the abdomen and pelvis was performed using the standard protocol following bolus administration of intravenous contrast. RADIATION DOSE REDUCTION: This exam was performed according to the departmental dose-optimization program which includes automated exposure control, adjustment of the mA and/or kV according to patient size and/or use of iterative reconstruction technique. CONTRAST:  OMNIPAQUE  IOHEXOL  300 MG/ML  SOLN COMPARISON:  Abdominal radiographs 03/07/2022 FINDINGS: Lower chest: Lung bases are clear. Hepatobiliary: No focal liver abnormality is seen. No gallstones, gallbladder wall thickening, or biliary dilatation. Pancreas: Unremarkable. No  pancreatic ductal dilatation or surrounding inflammatory changes. Spleen: Normal in size without focal abnormality. Adrenals/Urinary Tract: Adrenal glands are unremarkable. Kidneys are normal, without renal calculi, focal lesion, or hydronephrosis. Bladder is unremarkable. Stomach/Bowel: Stomach, small bowel, and colon are not abnormally distended. No wall thickening or inflammatory stranding. Appendix is normal. Vascular/Lymphatic: Aortic atherosclerosis. No enlarged abdominal or pelvic lymph nodes. Reproductive: Uterus and left ovary are not enlarged. Right ovarian lesion measuring 2.7 cm diameter containing fat and calcification consistent with a dermoid cyst. Gas is demonstrated in the vagina. This is nonspecific. No discrete fistula is identified at CT. Other: No free air or free fluid in the abdomen. Abdominal wall musculature appears intact. Musculoskeletal: No acute or significant osseous findings. IMPRESSION: 1. No evidence of bowel obstruction or inflammation. 2. Nonspecific  gas in the vagina. No discrete fistula identified. This does not exclude the presence of the fistula. 3. 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 4. Aortic atherosclerosis. Electronically Signed   By: Elsie Gravely M.D.   On: 11/27/2023 21:32    Procedures Procedures    Medications Ordered in ED Medications  potassium chloride  SA (KLOR-CON  M) CR tablet 40 mEq (has no administration in time range)  potassium chloride  10 mEq in 100 mL IVPB (has no administration in time range)  sodium chloride  0.9 % bolus 1,000 mL (has no administration in time range)  LORazepam  (ATIVAN ) injection 1 mg (has no administration in time range)  magnesium  oxide (MAG-OX) tablet 400 mg (has no administration in time range)  iohexol  (OMNIPAQUE ) 300 MG/ML solution 100 mL (100 mLs Intravenous Contrast Given 11/27/23 2114)    ED Course/ Medical Decision Making/ A&P                                 Medical Decision Making Amount and/or  Complexity of Data Reviewed Labs: ordered.   This patient is a 44 y.o. female  who presents to the ED for concern of abdominal pain, rectal bleeding, vaginal bleeding, concern raised of stool coming from vagina.   Differential diagnoses prior to evaluation: The emergent differential diagnosis includes, but is not limited to,  The causes of generalized abdominal pain include but are not limited to AAA, mesenteric ischemia, appendicitis, diverticulitis, DKA, gastritis, gastroenteritis, AMI, nephrolithiasis, pancreatitis, peritonitis, adrenal insufficiency,lead poisoning, iron toxicity, intestinal ischemia, constipation, UTI,SBO/LBO, splenic rupture, biliary disease, IBD, IBS, PUD, or hepatitis --on patient's description of stool coming from the vaginal canal concern for rectal vaginal fistula, versus other enterocutaneous fistula. This is not an exhaustive differential.   Past Medical History / Co-morbidities / Social History: hypertension, hyperlipidemia, lichen planus, obesity, GERD, diarrhea  Additional history: Chart reviewed. Pertinent results include: Reviewed lab work, imaging from recent previous emergency department visits  Physical Exam: Physical exam performed. The pertinent findings include: Mild tenderness to palpation in the left flank, no anterior abdominal tenderness, no rebound, rigidity, guarding.   Slow dark blood coming from cervical os, no cervical motion tenderness.  No suprapubic or adnexal tenderness.  No significant vaginal discharge.  External rectum appears unremarkable, no active bleeding from the rectum, normal rectal tone, soft brown stool in rectal vault.  Lab Tests/Imaging studies: I personally interpreted labs/imaging and the pertinent results include: BMP notable for significant hypokalemia, potassium 2.7 we will orally replete and administer IV potassium.  Her UA with some trace ketones, otherwise overall unremarkable.  Mildly low magnesium  at 1.6, and CBC with  mild anemia, hemoglobin 11.5.  No notable leukocytosis. CTAP with dermoid cyst and uterus, there is some air in the vagina but this is post pelvic exam, they cannot exclude fistula but they do not see any acute changes on exam.  Cardiac monitoring: EKG obtained and interpreted by myself and attending physician which shows: Normal sinus rhythm, nonspecific T wave abnormality   Medications: I ordered medication including fluid bolus, potassium for hypokalemia, dehydration, Ativan  for anxiety, hypomagnesemia.  I have reviewed the patients home medicines and have made adjustments as needed.  9:38 PM Care of SAMARY SHATZ transferred to [facility] and Dr. Lenor at the end of my shift as the patient will require reassessment once labs/imaging have resulted. Patient presentation, ED course, and plan of care discussed with review  of all pertinent labs and imaging. Please see his/her note for further details regarding further ED course and disposition. Plan at time of handoff is reassess after medications including patient's pain.  I think that she is likely stable for discharge with outpatient GYN follow-up as long as her pain is not worsening in the ED.  Recommend discharge on potassium supplement.. This may be altered or completely changed at the discretion of the oncoming team pending results of further workup.  Final Clinical Impression(s) / ED Diagnoses Final diagnoses:  None    Rx / DC Orders ED Discharge Orders     None         Rosan Sherlean VEAR DEVONNA 11/27/23 2138    Lenor Hollering, MD 11/27/23 2355

## 2023-11-27 NOTE — ED Triage Notes (Signed)
 Notice feces "coming out of my vagina" , some bleeding Noticed today Abdo pain

## 2023-11-27 NOTE — Discharge Instructions (Addendum)
 Your CT scan today did not show any concerning cause of your symptoms.  However it did show that within the vaginal vault you had some air.  This is a very nonspecific finding.  Your exam did not show any concern for vaginal perforation.  Please follow-up with your GYN.  If you have any worsening in symptoms please return to the emergency room.  Your potassium was also low today.  You received a potassium dose in the emergency department.  I have sent additional potassium supplement into the pharmacy for you.  Please take this twice daily for the next 5 days.  Please follow-up with your primary care doctor within the next 5 days to have your potassium level rechecked to ensure that this improves.  I have sent some nausea medicine into the pharmacy for you as well to help saluted you continue to tolerate p.o. intake.

## 2023-11-28 ENCOUNTER — Ambulatory Visit: Payer: Self-pay | Admitting: Family Medicine

## 2023-11-28 ENCOUNTER — Other Ambulatory Visit: Payer: Self-pay | Admitting: Gastroenterology

## 2023-11-28 ENCOUNTER — Encounter: Payer: Self-pay | Admitting: Obstetrics & Gynecology

## 2023-11-28 ENCOUNTER — Encounter: Payer: Self-pay | Admitting: Gastroenterology

## 2023-11-28 ENCOUNTER — Ambulatory Visit (INDEPENDENT_AMBULATORY_CARE_PROVIDER_SITE_OTHER): Payer: No Typology Code available for payment source | Admitting: Gastroenterology

## 2023-11-28 VITALS — BP 136/89 | HR 75 | Temp 97.6°F | Ht 65.0 in | Wt 228.8 lb

## 2023-11-28 DIAGNOSIS — K625 Hemorrhage of anus and rectum: Secondary | ICD-10-CM | POA: Insufficient documentation

## 2023-11-28 DIAGNOSIS — K219 Gastro-esophageal reflux disease without esophagitis: Secondary | ICD-10-CM

## 2023-11-28 DIAGNOSIS — R1013 Epigastric pain: Secondary | ICD-10-CM

## 2023-11-28 DIAGNOSIS — R197 Diarrhea, unspecified: Secondary | ICD-10-CM

## 2023-11-28 DIAGNOSIS — R232 Flushing: Secondary | ICD-10-CM

## 2023-11-28 MED ORDER — PANTOPRAZOLE SODIUM 40 MG PO TBEC: 40.0000 mg | DELAYED_RELEASE_TABLET | Freq: Two times a day (BID) | ORAL | 3 refills | Status: DC

## 2023-11-28 MED ORDER — HYDROCORTISONE (PERIANAL) 2.5 % EX CREA: 1.0000 | TOPICAL_CREAM | Freq: Two times a day (BID) | CUTANEOUS | 1 refills | Status: DC

## 2023-11-28 NOTE — Telephone Encounter (Signed)
 Spoke with patient and informed per drs notes also forwarded message via mychart

## 2023-11-28 NOTE — Telephone Encounter (Signed)
 Nurses I reviewed over her labs also go over her most recent notes She was prescribed 20 mill equivalent potassium in the ER I would recommend taking 1 3 times daily for 3 days then 1 twice daily for 3 days Then I would recommend rechecking the metabolic 7 along with a magnesium  Wednesday or Thursday of this coming week When she sees Dr. Bluford on February 10 he may decide to place her on a low-dose potassium moving forward  Finally I did also recommend keeping follow-up visit with Dr. Bluford on the 10th as planned She is on a medication called losartan  which typically tells the kidneys to hold onto potassium And she is on hydrochlorothiazide  which can cause potassium to spill into the urine

## 2023-11-28 NOTE — Telephone Encounter (Signed)
 Pt concerned d/t low potassium. Potassium was low in the office two days ago. Pt went to the ED last night and had labs drawn there and her potassium was low again. Pt wants clarification about the results and how to address the potassium moving forward. RN advised pt she would relay concerns to the appropriate people for follow-up, pt verbalized understanding.  Copied from CRM [id number]. Topic: Clinical - Lab/Test Results >> Nov 28, 2023  8:07 AM Antonio DEL wrote: Reason for CRM: Patient would like a call from the nurse to go over her lab results from the hospital, she received them in MyChart but doesn't understand Reason for Disposition  General information question, no triage required and triager able to answer question  Answer Assessment - Initial Assessment Questions 1. REASON FOR CALL or QUESTION: What is your reason for calling today? or How can I best help you? or What question do you have that I can help answer?      Pt states she had labs drawn at the office 2 days ago and was concerned about her potassium being low. RN advised pt that [facility] note about pt's potassium stated that pt's potassium is just barely lower than the typical range and that the pt may increase dietary potassium intake to help increase your potassium.  Protocols used: Information Only Call - No Triage-A-AH

## 2023-11-28 NOTE — Progress Notes (Signed)
 Referring Provider: Cook, Jayce G, DO Primary Care Physician:  Cook, Jayce G, DO Primary GI Physician: Dr. Cindie  Chief Complaint  Patient presents with   Follow-up    Follow up from ED. Mucous in stool. Lots of loose stools and it smells bad. Thinks stool is coming out of vagina.      HPI:   Miranda Wade is a 43 y.o. female presenting today with multiple GI concerns.  Has diarrhea often.  Stool is mushy to liquid.  Has mucus in it.  Smells terrible.  Present for the last few months though stools have actually been mushy since her colonoscopy.  Before that, she was having trouble with constipation.  Used to take a stool softener, but has not taken them in a while.  Tends to have 1-2 bowel movements per day. No significant issue with gas or bloating.  Stomach is uneasy.  No antibiotics prior to onset.  No sick contacts. No abdominal pain related to bowel movements.  Yesterday morning, there was a little blood in the toilet. Wiped and there was some on toilet tissue. Thought maybe hemorrhoids. In the past, has only ever had a small amount of blood on the toilet tissue, but this wasn't a usual thing. More so with itching.  Last night, went to the bathroom and again with a little blood. Wiped the front and it was dark. Thought it was poop. Went to the ER and was told it was probably dried blood.  She is currently on her cycle and will be ending today.   Reviewed ER evaluation.  She did have mild anemia with hemoglobin of 11.5 though she is on her menstrual cycle.  Potassium was low at 2.7.  Magnesium  low at 1.6.  Urine negative.  Fecal occult blood negative.  CT A/P with contrast was with no GI abnormalities.  Nonspecific gas in the vagina.  No discrete fistula.  Rectal exam was unremarkable.  Vaginal exam showed slow dark blood coming from the cervical os.  No other significant findings.    Gets flushing, feels like she will pass out. Comes and goes. Random. Not specifically related  to bowels. Occurs 3 times a week.  Has been going on since prior to her colonoscopy.   Feels like foods sit in the upper abdomen. Worse with spaghetti/spicy. No nausea or vomiting. No heartburn or reflux, but is having indigestion. Taking pantoprazole  40 mg once a day. Waking up with some upper abdominal pain. This started about 1 month ago. Felt better when taking pantoprazole  BID.    No weight loss.   No NSAIDs.     Last colonoscopy 04/29/2022 for rectal pain showing nonbleeding internal hemorrhoids, otherwise normal exam.  Last EGD 04/29/2022: 2 cm hiatal hernia, grade a reflux esophagitis, gastritis biopsied, single duodenal polyp resected and retrieved.  Pathology showed fragments of gastric mucosa with minimal vascular ectasia, no H. pylori.  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  Recommended PPI twice daily x 12 weeks, then decrease to once daily.    Past Medical History:  Diagnosis Date   Anemia    Essential hypertension    Hyperlipidemia    Seasonal allergies     Past Surgical History:  Procedure Laterality Date   BIOPSY  04/29/2022   Procedure: BIOPSY;  Surgeon: Cindie Carlin POUR, DO;  Location: [facility];  Service: Endoscopy;;   COLONOSCOPY WITH PROPOFOL  N/A 04/29/2022  Surgeon: Cindie Carlin POUR, DO;  nonbleeding internal hemorrhoids, otherwise normal exam.   ESOPHAGOGASTRODUODENOSCOPY (EGD) WITH PROPOFOL  N/A 04/29/2022   Surgeon: Cindie Carlin POUR, DO;  cm hiatal hernia, grade a reflux esophagitis, gastritis biopsied, single duodenal polyp resected and retrieved.  Pathology showed fragments of gastric mucosa with minimal vascular ectasia, no H. pylori.  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   KNEE ARTHROSCOPY WITH SUBCHONDROPLASTY Right 06/20/2023    Procedure: KNEE ARTHROSCOPY, PARTIAL MEDIAL MENISCECTOMY WITH MEDIAL TIBIA SUBCHONDROPLASTY;  Surgeon: Sharl Selinda Dover, MD;  Location: [facility];  Service: Orthopedics;  Laterality: Right;   LAPAROSCOPIC BILATERAL SALPINGECTOMY Bilateral 05/01/2022   Procedure: LAPAROSCOPIC BILATERAL SALPINGECTOMY;  Surgeon: Jayne Vonn DEL, MD;  Location: [facility];  Service: Gynecology;  Laterality: Bilateral;   MASS EXCISION Right 08/04/2017   Procedure: EXCISION OF RIGHT NASAL  MASS;  Surgeon: Karis Clunes, MD;  Location: [facility];  Service: ENT;  Laterality: Right;  LOCAL   POLYPECTOMY  04/29/2022   Procedure: POLYPECTOMY;  Surgeon: Cindie Carlin POUR, DO;  Location: [facility];  Service: Endoscopy;;   REMOVAL OF NON VAGINAL CONTRACEPTIVE DEVICE N/A 05/01/2022   Procedure: REMOVAL OF NON VAGINAL CONTRACEPTIVE DEVICE;  Surgeon: Jayne Vonn DEL, MD;  Location: [facility];  Service: Gynecology;  Laterality: N/A;   WRIST SURGERY      Current Outpatient Medications  Medication Sig Dispense Refill   Ascorbic Acid (VITAMIN C PO) Take 1 tablet by mouth daily.     atorvastatin  (LIPITOR) 80 MG tablet Take 1 tablet (80 mg total) by mouth daily. 30 tablet 0   clobetasol  cream (TEMOVATE ) 0.05 % Apply 1 Application topically 2 (two) times daily. 30 g 11   doxycycline  (VIBRAMYCIN ) 100 MG capsule Take 1 capsule (100 mg total) by mouth 2 (two) times daily. 20 capsule 0   ferrous sulfate 325 (65 FE) MG tablet Take 325 mg by mouth daily with breakfast.     fluticasone  (FLONASE ) 50 MCG/ACT nasal spray Place 2 sprays into both nostrils daily. 16 g 0   hydrochlorothiazide  (HYDRODIURIL ) 25 MG tablet Take 1 tablet (25 mg total) by mouth daily. 90 tablet 3   hydrocortisone  (ANUSOL -HC) 2.5 % rectal cream Place 1 Application rectally 2 (two) times daily. 30 g 1   ibuprofen  (ADVIL ) 600 MG tablet Take 600 mg by mouth every 6 (six) hours as needed for moderate pain.     losartan  (COZAAR ) 100 MG tablet Take  1 tablet (100 mg total) by mouth daily. 30 tablet 0   Omega-3 Fatty Acids (FISH OIL PO) Take 1 capsule by mouth daily.     ondansetron  (ZOFRAN -ODT) 4 MG disintegrating tablet Take 1 tablet (4 mg total) by mouth every 8 (eight) hours as needed. 20 tablet 0   oxyCODONE  (ROXICODONE ) 5 MG immediate release tablet Take 1 tablet (5 mg total) by mouth every 4 (four) hours as needed for severe pain (pain score 7-10). 12 tablet 0   pantoprazole  (PROTONIX ) 40 MG tablet Take 1 tablet (40 mg total) by mouth 2 (two) times daily before a meal. 60 tablet 3   potassium chloride  SA (KLOR-CON  M) 20 MEQ tablet Take 2 tablets (40 mEq total) by mouth 2 (two) times daily for 5 days. (Patient not taking: Reported on 11/28/2023) 20 tablet 0   Current Facility-Administered Medications  Medication Dose Route Frequency Provider Last Rate Last Admin   Gerhardt's butt cream   Topical  TID Jayne Vonn DEL, MD        Allergies as of 11/28/2023   (No Known Allergies)    Family History  Problem Relation Age of Onset   Diabetes Mother    Hypertension Mother    Arthritis Mother    Hyperlipidemia Mother    Stroke Mother    Hypertension Father    Heart disease Father 32       Valve replacement   Hyperlipidemia Father    Asthma Daughter    Hypertension Brother    Stroke Maternal Grandmother    Allergies Daughter     Social History   Socioeconomic History   Marital status: Married    Spouse name: Selinda   Number of children: 2   Years of education: 14   Highest education level: Some college, no degree  Occupational History   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  Tobacco Use   Smoking status: Former    Current packs/day: 0.50    Types: Cigarettes    Passive exposure: Current   Smokeless tobacco: Never  Vaping Use   Vaping status: Never Used  Substance and Sexual Activity   Alcohol use: No   Drug use: No   Sexual activity: Not Currently    Birth control/protection: Surgical    Comment: tubal  Other  Topics Concern   Not on file  Social History Narrative   Lives daughter Miranda Wade is in college, home some weekends   Currently separated from husband   Social Drivers of Health   Financial Resource Strain: Low Risk  (11/17/2023)   Overall Financial Resource Strain (CARDIA)    Difficulty of Paying Living Expenses: Not hard at all  Food Insecurity: No Food Insecurity (11/17/2023)   Hunger Vital Sign    Worried About Running Out of Food in the Last Year: Never true    Ran Out of Food in the Last Year: Never true  Transportation Needs: No Transportation Needs (11/17/2023)   PRAPARE - Administrator, Civil Service (Medical): No    Lack of Transportation (Non-Medical): No  Physical Activity: Insufficiently Active (11/17/2023)   Exercise Vital Sign    Days of Exercise per Week: 3 days    Minutes of Exercise per Session: 30 min  Stress: No Stress Concern Present (11/17/2023)   [employer] of Occupational Health - Occupational Stress Questionnaire    Feeling of Stress : Not at all  Social Connections: Moderately Integrated (11/17/2023)   Social Connection and Isolation Panel [NHANES]    Frequency of Communication with Friends and Family: More than three times a week    Frequency of Social Gatherings with Friends and Family: Once a week    Attends Religious Services: 1 to 4 times per year    Active Member of [employer] or Organizations: No    Attends Engineer, Structural: Never    Marital Status: Married    Review of Systems: Gen: Denies fever, chills, cold or flulike symptoms, presyncope, syncope. CV: Denies chest pain, palpitations.  Resp: Denies dyspnea, cough.  GI: See HPI Heme: See HPI  Physical Exam: BP 136/89 (BP Location: Right Arm, Patient Position: Sitting, Cuff Size: Large)   Pulse 75   Temp 97.6 F (36.4 C) (Temporal)   Ht 5' 5 (1.651 m)   Wt 228 lb 12.8 oz (103.8 kg)   LMP 11/21/2023 (Approximate)   BMI 38.07 kg/m  General:   Alert  and oriented. No distress noted.  Pleasant and cooperative.  Head:  Normocephalic and atraumatic. Eyes:  Conjuctiva clear without scleral icterus. Heart:  S1, S2 present without murmurs appreciated. Lungs:  Clear to auscultation bilaterally. No wheezes, rales, or rhonchi. No distress.  Abdomen:  +BS, soft, and non-distended. Mild TTP in LUQ and LLQ. No rebound or guarding. No HSM or masses noted. Rectal: No external lesions. Some mild discomfort on rectal exam. No fissure appreciated. Possible hemorrhoid tissue.  Msk:  Symmetrical without gross deformities. Normal posture. Extremities:  Without edema. Neurologic:  Alert and  oriented x4 Psych:  Normal mood and affect.    Assessment:  42 year old female with history of HTN, HLD, anemia, GERD, duodenal polyp resected in 2023, presenting today with multiple GI concerns as per below.  Diarrhea/change in bowel habits/hot flashes Previously with constipation, but with loose stools since colonoscopy in July 2023, but now with diarrhea described as loose to watery, with mucus, and foul-smelling. No abdominal pain to suggest IBS. She had an episode of rectal bleeding yesterday, but this was more likely hemorrhoidal. Doubt IBD with normal colonoscopy in July 2023. Less consistent with infectious etiology as she is not having numerous bowel movements per day, but considering bowel habit change, mucus present, and foul-smelling, will rule out C. difficile.  In light of hot flashes, also checking 5-HIAA and will also check thyroid  function and screen for celiac disease.  Rectal bleeding: 2 episodes of rectal bleeding with small amount of blood in the toilet and on toilet tissue yesterday.  Likely hemorrhoidal.  Colonoscopy up-to-date in July 2023 with internal hemorrhoids, otherwise normal exam.  Will start Anusol  rectal cream. If ongoing symptoms/worsening, will need to consider updating colonoscopy.   ?stool from vagina:  Patient thought she passed stool  from her vagina yesterday, but this was more likely dark blood coming from her vagina as she is in the last day of her menstrual cycle.  Vaginal exam yesterday in the ER showed dark blood coming from the cervical os.  She also had CT A/P with contrast yesterday that did not show any obvious fistula.  Advised to monitor for any recurrent symptoms after she is off her menstrual cycle.  She has an appointment with OB/GYN on Monday to discuss further.   Epigastric pain: Recurrent for the last month.  Previously improved with pantoprazole  twice daily.  Suspect this is likely secondary to gastritis/GERD. EGD on file July 2023, having similar symptoms at that time, and was found to have 2 cm hiatal hernia, grade a reflux esophagitis, gastritis with no significant findings on pathology, single duodenal polyp that showed nodular hyperplasia Brunner's glands on pathology along with moderate vascular ectasia. Will increase pantoprazole  to twice daily.  GERD: Not adequately managed on pantoprazole  40 mg daily.  Will increase to twice a day.     Plan:  TSH, IgA, TTG IgA, 5-HIAA, C. difficile toxin A/B and GDH Increase pantoprazole  to 40 mg twice daily Anusol  rectal cream twice daily x 5-7 days. Further recommendations to follow labs/stool/urine testing.    Josette Centers, PA-C [facility] 11/28/2023

## 2023-11-28 NOTE — Patient Instructions (Addendum)
 Please have blood, urine, and stool testing completed at [facility].   Increase pantoprazole  to 40 mg twice daily 30 minutes before breakfast and dinner.  I suspect your recent episode of rectal bleeding was likely secondary to hemorrhoids.  I am sending in Anusol  rectal cream.  Use this twice a day for 5-7 days, then stop.  We will contact you with results and further recommendations.  Josette Centers, PA-C [facility]

## 2023-12-01 ENCOUNTER — Encounter: Payer: Self-pay | Admitting: Adult Health

## 2023-12-01 ENCOUNTER — Ambulatory Visit (INDEPENDENT_AMBULATORY_CARE_PROVIDER_SITE_OTHER): Payer: No Typology Code available for payment source | Admitting: Family Medicine

## 2023-12-01 ENCOUNTER — Ambulatory Visit: Payer: No Typology Code available for payment source | Admitting: Adult Health

## 2023-12-01 ENCOUNTER — Other Ambulatory Visit: Payer: Self-pay | Admitting: Gastroenterology

## 2023-12-01 VITALS — BP 136/84 | HR 65 | Temp 97.9°F | Ht 65.0 in | Wt 227.0 lb

## 2023-12-01 VITALS — BP 137/85 | HR 91 | Ht 65.0 in | Wt 227.5 lb

## 2023-12-01 DIAGNOSIS — I10 Essential (primary) hypertension: Secondary | ICD-10-CM

## 2023-12-01 DIAGNOSIS — R590 Localized enlarged lymph nodes: Secondary | ICD-10-CM

## 2023-12-01 DIAGNOSIS — D270 Benign neoplasm of right ovary: Secondary | ICD-10-CM | POA: Insufficient documentation

## 2023-12-01 DIAGNOSIS — E876 Hypokalemia: Secondary | ICD-10-CM

## 2023-12-01 LAB — SPECIMEN STATUS REPORT

## 2023-12-01 LAB — GC/CHLAMYDIA PROBE AMP (~~LOC~~) NOT AT ARMC
Chlamydia: NEGATIVE
Comment: NEGATIVE
Comment: NORMAL
Neisseria Gonorrhea: NEGATIVE

## 2023-12-01 MED ORDER — AMLODIPINE BESYLATE 5 MG PO TABS: 5.0000 mg | ORAL_TABLET | Freq: Every day | ORAL | 3 refills | Status: DC

## 2023-12-01 NOTE — Assessment & Plan Note (Signed)
 I believe that this was related to diarrhea especially in the setting of diuretic use.  Metabolic panel at the end of the week.  Continue potassium supplementation until complete.

## 2023-12-01 NOTE — Assessment & Plan Note (Signed)
 Continue losartan .  Stopping HCTZ in the setting of hypokalemia.  Starting amlodipine .

## 2023-12-01 NOTE — Progress Notes (Signed)
 Subjective:  Patient ID: Miranda Wade, female    DOB: 1980/12/16  Age: 43 y.o. MRN: [id number]  CC:   Chief Complaint  Patient presents with   ER follow up    Abd pain and potassium problem, knot on R side of neck     HPI:  43 year old female presents for follow-up.  Patient recently seen in the ER for abdominal pain.  ER note reflects that she had diarrhea as well.  Patient states that she did not present with significant diarrhea.  She had quite significant hypokalemia with a potassium of 2.7.  Diarrhea would explain hypokalemia especially in the setting of diuretic use.  Patient concerned about her potassium.  She is currently on potassium supplementation.  Blood pressure is well-controlled on losartan  and HCTZ.  Will discuss change in therapy given hypokalemia.  Patient has been seen by GI and follow-up regarding abdominal pain, diarrhea, and GERD.  Patient also had some suspected hemorrhoidal bleeding.  Stable.  Patient also concerned about persistent lymph node to the right side of the neck.  Recent CBC was normal.  Patient Active Problem List   Diagnosis Date Noted   Hypokalemia 12/01/2023   Cervical lymphadenopathy 12/01/2023   Rectal bleeding 11/28/2023   Lichen planus 11/24/2023   GERD (gastroesophageal reflux disease) 01/02/2023   LVH (left ventricular hypertrophy) 07/21/2017   Essential hypertension, benign 04/30/2016   Morbid obesity (HCC) 04/30/2016   Hyperlipidemia 04/30/2016    Social Hx   Social History   Socioeconomic History   Marital status: Married    Spouse name: Reymundo Caulk   Number of children: 2   Years of education: 14   Highest education level: Some college, no degree  Occupational History   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  Tobacco Use   Smoking status: Former    Current packs/day: 0.50    Types: Cigarettes    Passive exposure: Current   Smokeless tobacco: Never  Vaping Use   Vaping status: Never Used  Substance and Sexual  Activity   Alcohol use: No   Drug use: No   Sexual activity: Not Currently    Birth control/protection: Surgical    Comment: tubal  Other Topics Concern   Not on file  Social History Narrative   Lives daughter Roxane Copp is in college, home some weekends   Currently separated from husband   Social Drivers of Health   Financial Resource Strain: Low Risk  (11/17/2023)   Overall Financial Resource Strain (CARDIA)    Difficulty of Paying Living Expenses: Not hard at all  Food Insecurity: No Food Insecurity (11/17/2023)   Hunger Vital Sign    Worried About Running Out of Food in the Last Year: Never true    Ran Out of Food in the Last Year: Never true  Transportation Needs: No Transportation Needs (11/17/2023)   PRAPARE - Administrator, Civil Service (Medical): No    Lack of Transportation (Non-Medical): No  Physical Activity: Insufficiently Active (11/17/2023)   Exercise Vital Sign    Days of Exercise per Week: 3 days    Minutes of Exercise per Session: 30 min  Stress: No Stress Concern Present (11/17/2023)   [employer] of Occupational Health - Occupational Stress Questionnaire    Feeling of Stress : Not at all  Social Connections: Moderately Integrated (11/17/2023)   Social Connection and Isolation Panel [NHANES]    Frequency of Communication with Friends and Family: More than three times  a week    Frequency of Social Gatherings with Friends and Family: Once a week    Attends Religious Services: 1 to 4 times per year    Active Member of [employer] or Organizations: No    Attends Engineer, structural: Never    Marital Status: Married    Review of Systems Per HPI  Objective:  BP 136/84   Pulse 65   Temp 97.9 F (36.6 C)   Ht 5\' 5"  (1.651 m)   Wt 227 lb (103 kg)   LMP 11/21/2023 (Approximate)   SpO2 100%   BMI 37.77 kg/m      12/01/2023   11:04 AM 12/01/2023   11:03 AM 11/28/2023   10:22 AM  BP/Weight  Systolic BP 136 171 136  Diastolic  BP 84 89 89  Wt. (Lbs)  227 228.8  BMI  37.77 kg/m2 38.07 kg/m2    Physical Exam Constitutional:      General: She is not in acute distress.    Appearance: Normal appearance.  HENT:     Head: Normocephalic and atraumatic.  Neck:     Comments: Small pea-sized lymph node to the right side of the neck. Cardiovascular:     Rate and Rhythm: Normal rate and regular rhythm.  Pulmonary:     Effort: Pulmonary effort is normal.     Breath sounds: Normal breath sounds.  Neurological:     Mental Status: She is alert.     Lab Results  Component Value Date   WBC 8.9 11/27/2023   HGB 11.5 (L) 11/27/2023   HCT 34.2 (L) 11/27/2023   PLT 310 11/27/2023   GLUCOSE 102 (H) 11/27/2023   CHOL 195 11/24/2023   TRIG 66 11/24/2023   HDL 40 11/24/2023   LDLCALC 143 (H) 11/24/2023   ALT 14 11/24/2023   AST 13 11/24/2023   NA 139 11/27/2023   K 2.7 (LL) 11/27/2023   CL 101 11/27/2023   CREATININE 0.79 11/27/2023   BUN 8 11/27/2023   CO2 27 11/27/2023   TSH 1.430 11/28/2023   HGBA1C 5.7 (H) 04/17/2023     Assessment & Plan:   Problem List Items Addressed This Visit       Cardiovascular and Mediastinum   Essential hypertension, benign   Continue losartan .  Stopping HCTZ in the setting of hypokalemia.  Starting amlodipine .      Relevant Medications   amLODipine  (NORVASC ) 5 MG tablet     Immune and Lymphatic   Cervical lymphadenopathy   Benign features.  Normal CBC.  Advised to monitor.  If continues to persist will refer to ENT.        Other   Hypokalemia - Primary   I believe that this was related to diarrhea especially in the setting of diuretic use.  Metabolic panel at the end of the week.  Continue potassium supplementation until complete.      Relevant Orders   Basic Metabolic Panel   Magnesium     Meds ordered this encounter  Medications   amLODipine  (NORVASC ) 5 MG tablet    Sig: Take 1 tablet (5 mg total) by mouth daily.    Dispense:  90 tablet    Refill:  3     Follow-up:  Return in about 1 month (around 12/29/2023).  Kathleen Papa DO [facility]

## 2023-12-01 NOTE — Assessment & Plan Note (Signed)
 Benign features.  Normal CBC.  Advised to monitor.  If continues to persist will refer to ENT.

## 2023-12-01 NOTE — Progress Notes (Addendum)
  Subjective:     Patient ID: Miranda Wade, female   DOB: Feb 04, 1981, 43 y.o.   MRN: [id number]  HPI Rosie is a 43 year old black female, married, [id number], in for follow up on being seen in ER 11/27/23 for abdominal pain and diarrhea.  She thought she may have had stool in vagina but could have been blood, she was not sure. She had CT that showed: IMPRESSION: 1. No evidence of bowel obstruction or inflammation. 2. Nonspecific gas in the vagina. No discrete fistula identified. This does not exclude the presence of the fistula. 3. 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 4. Aortic atherosclerosis. She has seen GI, too, with no fistula seen.     Component Value Date/Time   DIAGPAP  09/12/2023 0943    - Negative for intraepithelial lesion or malignancy (NILM)   HPVHIGH Negative 09/12/2023 0943   ADEQPAP  09/12/2023 0943    Satisfactory for evaluation; transformation zone component PRESENT.   PCP is Dr Debrah Fan.   Review of Systems No pain now No stool in vagina.  Reviewed past medical,surgical, social and family history. Reviewed medications and allergies.     Objective:   Physical Exam BP 137/85 (BP Location: Right Arm, Patient Position: Sitting, Cuff Size: Normal)   Pulse 91   Ht 5\' 5"  (1.651 m)   Wt 227 lb 8 oz (103.2 kg)   LMP 11/21/2023 (Approximate)   BMI 37.86 kg/m     Skin warm and dry.Pelvic: external genitalia is normal in appearance no lesions, pink, no opening in posterior wall seen on exam, or felt,urethra has no lesions or masses noted, cervix:smooth and bulbous, uterus: normal size, shape and contour, non tender, no masses felt, adnexa: no masses or tenderness noted. Bladder is non tender and no masses felt. On rectal exam no masses felt or defect.  Upstream - 12/01/23 1359       Pregnancy Intention Screening   Does the patient want to become pregnant in the next year? No    Does the patient's partner want to become pregnant in the next year? No    Would  the patient like to discuss contraceptive options today? No      Contraception Wrap Up   Current Method Female Sterilization    End Method Female Sterilization    Contraception Counseling Provided No             Examination chaperoned by Alphonso Aschoff LPN Assessment:     1. Cyst, ovary, dermoid, right (Primary) Has 2.7 cm dermoid cyst on right ovary. Discussed with Dr Ozan Will follow for now, will get US  in 6 months to assess for any growth  2. Essential hypertension, benign Take BP meds and follow up with PCP    Plan:     Follow up in 6 months for pelvic US  and see me after

## 2023-12-01 NOTE — Patient Instructions (Signed)
 Lab at the end of the week.  Stop hydrochlorothiazide .  Start Amlodipine .  Continue Losartan .  Follow up in 1 month

## 2023-12-03 LAB — TISSUE TRANSGLUTAMINASE, IGG: Tissue Transglut Ab: 3 U/mL (ref 0–5)

## 2023-12-03 LAB — TSH: TSH: 1.43 u[IU]/mL (ref 0.450–4.500)

## 2023-12-03 LAB — TISSUE TRANSGLUTAMINASE, IGA: Transglutaminase IgA: <2 U/mL (ref 0–3)

## 2023-12-04 ENCOUNTER — Encounter: Payer: Self-pay | Admitting: *Deleted

## 2023-12-04 ENCOUNTER — Encounter: Payer: Self-pay | Admitting: Internal Medicine

## 2023-12-04 ENCOUNTER — Encounter: Payer: Self-pay | Admitting: Family Medicine

## 2023-12-04 LAB — IGA: IgA/Immunoglobulin A, Serum: 261 mg/dL (ref 87–352)

## 2023-12-04 LAB — SPECIMEN STATUS REPORT

## 2023-12-05 LAB — 5 HIAA, QUANTITATIVE, URINE, 24 HOUR
5-HIAA, Ur: 3.1 mg/L
5-HIAA,Quant.,24 Hr Urine: 4.7 mg/(24.h) (ref 0.0–14.9)

## 2023-12-05 LAB — CLOSTRIDIUM DIFFICILE EIA: C difficile Toxins A+B, EIA: NEGATIVE

## 2023-12-09 ENCOUNTER — Encounter: Payer: Self-pay | Admitting: Family Medicine

## 2023-12-09 LAB — BASIC METABOLIC PANEL
BUN/Creatinine Ratio: 14 (ref 9–23)
BUN: 10 mg/dL (ref 6–24)
CO2: 22 mmol/L (ref 20–29)
Calcium: 8.9 mg/dL (ref 8.7–10.2)
Chloride: 103 mmol/L (ref 96–106)
Creatinine, Ser: 0.7 mg/dL (ref 0.57–1.00)
Glucose: 100 mg/dL — ABNORMAL HIGH (ref 70–99)
Potassium: 4 mmol/L (ref 3.5–5.2)
Sodium: 140 mmol/L (ref 134–144)
eGFR: 110 mL/min/{1.73_m2} (ref >59)

## 2023-12-09 LAB — MAGNESIUM: Magnesium: 1.9 mg/dL (ref 1.6–2.3)

## 2023-12-10 ENCOUNTER — Ambulatory Visit (HOSPITAL_COMMUNITY)
Admission: RE | Admit: 2023-12-10 | Discharge: 2023-12-10 | Disposition: A | Discharge status: Home / Self Care | Payer: No Typology Code available for payment source | Source: Ambulatory Visit | Attending: Family Medicine | Admitting: Family Medicine

## 2023-12-10 ENCOUNTER — Encounter: Payer: Self-pay | Admitting: Family Medicine

## 2023-12-10 ENCOUNTER — Ambulatory Visit (INDEPENDENT_AMBULATORY_CARE_PROVIDER_SITE_OTHER): Payer: No Typology Code available for payment source | Admitting: Family Medicine

## 2023-12-10 VITALS — BP 120/72 | HR 83 | Temp 98.1°F | Ht 65.0 in | Wt 226.0 lb

## 2023-12-10 DIAGNOSIS — R222 Localized swelling, mass and lump, trunk: Secondary | ICD-10-CM | POA: Insufficient documentation

## 2023-12-10 DIAGNOSIS — R051 Acute cough: Secondary | ICD-10-CM | POA: Insufficient documentation

## 2023-12-10 NOTE — Assessment & Plan Note (Signed)
X-ray for further evaluation.

## 2023-12-10 NOTE — Assessment & Plan Note (Signed)
 [place] for further evaluation

## 2023-12-10 NOTE — Progress Notes (Signed)
Subjective:  Patient ID: Miranda Wade, female    DOB: 1981/10/06  Age: 43 y.o. MRN: [id number]  CC:   Chief Complaint  Patient presents with   swelling in chest    Swelling in left chest x's 1 week. No recent illness, no injury. Tender to the touch. Also having chills x's 1 week     HPI:  43 year old female presents for evaluation of the above.    1 week history of swelling/fullness in the left upper chest.  No recent fall, trauma or injury.  Does not involve the breast. Recent mammogram in October (normal).  Patient is very worried, especially given recently cervical lymphadenopathy.  Also reports recent cough, particularly at night. No fever. Does report being "cold".  Patient Active Problem List   Diagnosis Date Noted   Localized swelling of chest wall 12/10/2023   Acute cough 12/10/2023   Cervical lymphadenopathy 12/01/2023   Cyst, ovary, dermoid, right 12/01/2023   Lichen planus 11/24/2023   GERD (gastroesophageal reflux disease) 01/02/2023   LVH (left ventricular hypertrophy) 07/21/2017   Essential hypertension, benign 04/30/2016   Morbid obesity (HCC) 04/30/2016   Hyperlipidemia 04/30/2016    Social Hx   Social History   Socioeconomic History   Marital status: Married    Spouse name: Barbara Cower   Number of children: 2   Years of education: 14   Highest education level: Some college, no degree  Occupational History   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  Tobacco Use   Smoking status: Former    Current packs/day: 0.50    Types: Cigarettes    Passive exposure: Current   Smokeless tobacco: Never  Vaping Use   Vaping status: Never Used  Substance and Sexual Activity   Alcohol use: No   Drug use: No   Sexual activity: Yes    Birth control/protection: Surgical    Comment: tubal  Other Topics Concern   Not on file  Social History Narrative   Lives daughter Howell Rucks is in college, home some weekends   Currently separated from husband    Social Drivers of Health   Financial Resource Strain: Low Risk  (11/17/2023)   Overall Financial Resource Strain (CARDIA)    Difficulty of Paying Living Expenses: Not hard at all  Food Insecurity: No Food Insecurity (11/17/2023)   Hunger Vital Sign    Worried About Running Out of Food in the Last Year: Never true    Ran Out of Food in the Last Year: Never true  Transportation Needs: No Transportation Needs (11/17/2023)   PRAPARE - Administrator, Civil Service (Medical): No    Lack of Transportation (Non-Medical): No  Physical Activity: Insufficiently Active (11/17/2023)   Exercise Vital Sign    Days of Exercise per Week: 3 days    Minutes of Exercise per Session: 30 min  Stress: No Stress Concern Present (11/17/2023)   [employer] of Occupational Health - Occupational Stress Questionnaire    Feeling of Stress : Not at all  Social Connections: Moderately Integrated (11/17/2023)   Social Connection and Isolation Panel [NHANES]    Frequency of Communication with Friends and Family: More than three times a week    Frequency of Social Gatherings with Friends and Family: Once a week    Attends Religious Services: 1 to 4 times per year    Active Member of [employer] or Organizations: No    Attends Banker Meetings: Never  Marital Status: Married    Review of Systems Per HPI  Objective:  BP 120/72   Pulse 83   Temp 98.1 F (36.7 C)   Ht 5\' 5"  (1.651 m)   Wt 226 lb (102.5 kg)   LMP 11/21/2023 (Approximate)   SpO2 100%   BMI 37.61 kg/m      12/10/2023   10:12 AM 12/01/2023    2:20 PM 12/01/2023    1:55 PM  BP/Weight  Systolic BP 120 137 145  Diastolic BP 72 85 87  Wt. (Lbs) 226  227.5  BMI 37.61 kg/m2  37.86 kg/m2    Physical Exam Vitals and nursing note reviewed. Exam conducted with a chaperone present.  Constitutional:      General: She is not in acute distress. HENT:     Head: Normocephalic and atraumatic.  Cardiovascular:     Rate and  Rhythm: Normal rate and regular rhythm.  Pulmonary:     Effort: Pulmonary effort is normal.     Breath sounds: Normal breath sounds. No wheezing or rales.  Chest:       Comments: Visible fullness/swelling at the labeled location. Neurological:     Mental Status: She is alert.  Psychiatric:     Comments: Anxious/upset.     Lab Results  Component Value Date   WBC 8.9 11/27/2023   HGB 11.5 (L) 11/27/2023   HCT 34.2 (L) 11/27/2023   PLT 310 11/27/2023   GLUCOSE 100 (H) 12/08/2023   CHOL 195 11/24/2023   TRIG 66 11/24/2023   HDL 40 11/24/2023   LDLCALC 143 (H) 11/24/2023   ALT 14 11/24/2023   AST 13 11/24/2023   NA 140 12/08/2023   K 4.0 12/08/2023   CL 103 12/08/2023   CREATININE 0.70 12/08/2023   BUN 10 12/08/2023   CO2 22 12/08/2023   TSH 1.430 11/28/2023   HGBA1C 5.7 (H) 04/17/2023     Assessment & Plan:  Localized swelling of chest wall Assessment & Plan: [place] for further evaluation.  Orders: -     [place] CHEST SOFT TISSUE  Acute cough Assessment & Plan: Xray for further evaluation.  Orders: -     DG Chest 2 View    Follow-up:  Pending results  Audy Dauphine Adriana Simas DO [facility]

## 2023-12-11 ENCOUNTER — Encounter: Payer: Self-pay | Admitting: Family Medicine

## 2023-12-13 DIAGNOSIS — E7800 Pure hypercholesterolemia, unspecified: Secondary | ICD-10-CM | POA: Diagnosis not present

## 2023-12-13 DIAGNOSIS — I10 Essential (primary) hypertension: Secondary | ICD-10-CM | POA: Diagnosis not present

## 2023-12-13 DIAGNOSIS — Z87891 Personal history of nicotine dependence: Secondary | ICD-10-CM | POA: Diagnosis not present

## 2023-12-13 DIAGNOSIS — R222 Localized swelling, mass and lump, trunk: Secondary | ICD-10-CM | POA: Diagnosis not present

## 2023-12-17 ENCOUNTER — Ambulatory Visit: Payer: No Typology Code available for payment source | Admitting: Adult Health

## 2023-12-17 ENCOUNTER — Encounter: Payer: Self-pay | Admitting: Adult Health

## 2023-12-17 VITALS — BP 156/105 | HR 84 | Ht 65.0 in | Wt 224.5 lb

## 2023-12-17 DIAGNOSIS — N6322 Unspecified lump in the left breast, upper inner quadrant: Secondary | ICD-10-CM

## 2023-12-17 DIAGNOSIS — I10 Essential (primary) hypertension: Secondary | ICD-10-CM

## 2023-12-17 DIAGNOSIS — B3731 Acute candidiasis of vulva and vagina: Secondary | ICD-10-CM | POA: Diagnosis not present

## 2023-12-17 DIAGNOSIS — L900 Lichen sclerosus et atrophicus: Secondary | ICD-10-CM

## 2023-12-17 MED ORDER — FLUCONAZOLE 150 MG PO TABS: ORAL_TABLET | ORAL | 1 refills | Status: DC

## 2023-12-17 NOTE — Progress Notes (Signed)
 Subjective:     Patient ID: Miranda Wade, female   DOB: [birth year], 43 y.o.   MRN: [id number]  HPI Miranda Wade is a 43 year old black female, married, in complaining of lump left chest for about 2 weeks, had [place] 12/10/23 that was normal, and has itchy rash in peri area.     Component Value Date/Time   DIAGPAP  09/12/2023 0943    - Negative for intraepithelial lesion or malignancy (NILM)   HPVHIGH Negative 09/12/2023 0943   ADEQPAP  09/12/2023 0943    Satisfactory for evaluation; transformation zone component PRESENT.    PCP is Dr Adriana Simas  Review of Systems Has lump left chest for about 2 weeks, had [place] 12/10/23 +itchy rash in peri area. Reviewed past medical,surgical, social and family history. Reviewed medications and allergies.     Objective:   Physical Exam BP (!) 156/105 (BP Location: Left Arm, Patient Position: Sitting, Cuff Size: Normal)   Pulse 84   Ht 5\' 5"  (1.651 m)   Wt 224 lb 8 oz (101.8 kg)   LMP 12/15/2023 (Exact Date)   BMI 37.36 kg/m      Skin warm and dry,  Breasts:no dominate palpable mass, retraction or nipple discharge on the right, on the left, no retraction or nipple discharge, fullness/mass at 11 o'clock 5 FB from the areola. It is non tender. Pelvic: external genitalia is normal in appearance, has rash lower vulva and buttocks, vagina:+blood, skin thin at introitus,urethra has no lesions or masses noted, cervix:smooth and bulbous, uterus: normal size, shape and contour, non tender, no masses felt, adnexa: no masses or tenderness noted. Bladder is non tender and no masses felt. Painted lower vulva and buttock with gentian violet.  Upstream - 12/17/23 1153       Pregnancy Intention Screening   Does the patient want to become pregnant in the next year? No    Does the patient's partner want to become pregnant in the next year? No    Would the patient like to discuss contraceptive options today? No      Contraception Wrap Up   Current Method Female Sterilization     End Method Female Sterilization    Contraception Counseling Provided No             Examination chaperoned by Malachy Mood LPN  Assessment:     1. Candidal vulvovaginitis, recurrent Has itchy rash, painted with gentian violet Will rx diflucan Meds ordered this encounter  Medications   fluconazole (DIFLUCAN) 150 MG tablet    Sig: Take 1 now and 1 in 3 days and then 3 days after that. DO NOT take Lipitor when taking    Dispense:  3 tablet    Refill:  1    Supervising Provider:   Despina Hidden, LUTHER H [[id number]]   Recheck in 1 week   2. Lichen sclerosus  3. Mass of upper inner quadrant of left breast (Primary) +fullness/mass at 11 o'clock 5 FB from the areola. It is non tender. Has noticed for about 2 weeks, has normal [place] of chest 12/10/23 Will get diagnostic left mammogram and [place] at [facility] 01/01/24 at 2:40 pm  - MM 3D DIAGNOSTIC MAMMOGRAM UNILATERAL LEFT BREAST; Future - [place] LIMITED ULTRASOUND INCLUDING AXILLA LEFT BREAST ; Future  4. Essential hypertension, benign She is nervous and teary today Take BP meds and follow up with PCP     Plan:     Follow up in 1 week to recheck peri area

## 2023-12-24 ENCOUNTER — Ambulatory Visit: Payer: No Typology Code available for payment source | Admitting: Adult Health

## 2023-12-25 ENCOUNTER — Encounter: Payer: Self-pay | Admitting: Internal Medicine

## 2023-12-25 ENCOUNTER — Ambulatory Visit: Payer: No Typology Code available for payment source | Admitting: Internal Medicine

## 2023-12-25 ENCOUNTER — Ambulatory Visit: Payer: No Typology Code available for payment source | Admitting: Adult Health

## 2023-12-25 ENCOUNTER — Encounter: Payer: Self-pay | Admitting: Adult Health

## 2023-12-25 VITALS — BP 146/88 | HR 80 | Ht 65.0 in | Wt 224.5 lb

## 2023-12-25 VITALS — BP 132/88 | HR 71 | Temp 97.3°F | Ht 65.0 in | Wt 220.0 lb

## 2023-12-25 DIAGNOSIS — R197 Diarrhea, unspecified: Secondary | ICD-10-CM

## 2023-12-25 DIAGNOSIS — I10 Essential (primary) hypertension: Secondary | ICD-10-CM | POA: Diagnosis not present

## 2023-12-25 DIAGNOSIS — L900 Lichen sclerosus et atrophicus: Secondary | ICD-10-CM | POA: Diagnosis not present

## 2023-12-25 DIAGNOSIS — K219 Gastro-esophageal reflux disease without esophagitis: Secondary | ICD-10-CM

## 2023-12-25 DIAGNOSIS — R195 Other fecal abnormalities: Secondary | ICD-10-CM

## 2023-12-25 DIAGNOSIS — B3731 Acute candidiasis of vulva and vagina: Secondary | ICD-10-CM

## 2023-12-25 NOTE — Progress Notes (Signed)
 Referring Provider: Tommie Sams, DO Primary Care Physician:  Tommie Sams, DO Primary GI:  Dr. Marletta Lor  Chief Complaint  Patient presents with   Follow-up    Patient here today for a follow up. Patient says Abdominal pain has subsided, and she had a bout with diarrhea a month ago, which has subside as well. She says she has seen some mucus in her stools.    HPI:   Miranda Wade is a 43 y.o. female who presents to clinic today for follow-up visit.  Diarrhea, mucus in stool: Patient was in her normal state of health until approximately 6 weeks ago when she had sudden onset of diarrhea, multiple loose bowel movements daily.  Also noted mucus in her stool and 2 episodes of rectal bleeding.  Believes she may have had stool in her vagina as well prompting ER visit.  CT abdomen pelvis with contrast without evidence of obvious fistula.  Was seen by gynecology, diagnosed with candidal vulvovaginitis, treated with Diflucan and the symptoms resolved.  C. difficile testing negative.  Today, states her diarrhea has resolved.  Occasionally will have some mucus though this is improving as well.  Noted to have abdominal pain on previous visit which is also resolved.  Overall feeling better today.  Chronic GERD: Previously uncontrolled on pantoprazole daily.  She is increased to twice daily 1 month ago in office and states this is much improved.   EGD 04/29/2022: 2 cm hiatal hernia, grade a reflux esophagitis, gastritis biopsied, single duodenal polyp resected and retrieved.  Pathology showed fragments of gastric mucosa with minimal vascular ectasia, no H. pylori.  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    Colonoscopy 04/29/2022 for rectal pain showing nonbleeding internal hemorrhoids, otherwise normal exam.    Past Medical History:  Diagnosis Date   Anemia    Essential hypertension     Hyperlipidemia    Seasonal allergies     Past Surgical History:  Procedure Laterality Date   BIOPSY  04/29/2022   Procedure: BIOPSY;  Surgeon: Lanelle Bal, DO;  Location: [facility];  Service: Endoscopy;;   COLONOSCOPY WITH PROPOFOL N/A 04/29/2022   Surgeon: Lanelle Bal, DO;  nonbleeding internal hemorrhoids, otherwise normal exam.   ESOPHAGOGASTRODUODENOSCOPY (EGD) WITH PROPOFOL N/A 04/29/2022   Surgeon: Lanelle Bal, DO;  cm hiatal hernia, grade a reflux esophagitis, gastritis biopsied, single duodenal polyp resected and retrieved.  Pathology showed fragments of gastric mucosa with minimal vascular ectasia, no H. pylori.  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   KNEE ARTHROSCOPY WITH SUBCHONDROPLASTY Right 06/20/2023   Procedure: KNEE ARTHROSCOPY, PARTIAL MEDIAL MENISCECTOMY WITH MEDIAL TIBIA SUBCHONDROPLASTY;  Surgeon: Yolonda Kida, MD;  Location: [facility];  Service: Orthopedics;  Laterality: Right;   LAPAROSCOPIC BILATERAL SALPINGECTOMY Bilateral 05/01/2022   Procedure: LAPAROSCOPIC BILATERAL SALPINGECTOMY;  Surgeon: Lazaro Arms, MD;  Location: [facility];  Service: Gynecology;  Laterality: Bilateral;   MASS EXCISION Right 08/04/2017   Procedure: EXCISION OF RIGHT NASAL  MASS;  Surgeon: Newman Pies, MD;  Location: [facility];  Service: ENT;  Laterality: Right;  LOCAL   POLYPECTOMY  04/29/2022   Procedure: POLYPECTOMY;  Surgeon: Lanelle Bal, DO;  Location: [facility];  Service: Endoscopy;;   REMOVAL OF NON VAGINAL CONTRACEPTIVE DEVICE N/A 05/01/2022   Procedure: REMOVAL OF  NON VAGINAL CONTRACEPTIVE DEVICE;  Surgeon: Lazaro Arms, MD;  Location: [facility];  Service: Gynecology;  Laterality: N/A;   WRIST SURGERY      Current Outpatient Medications  Medication Sig Dispense Refill   amLODipine (NORVASC) 5 MG tablet Take 1  tablet (5 mg total) by mouth daily. 90 tablet 3   Ascorbic Acid (VITAMIN C PO) Take 1 tablet by mouth daily.     atorvastatin (LIPITOR) 80 MG tablet Take 1 tablet (80 mg total) by mouth daily. 30 tablet 0   clobetasol cream (TEMOVATE) 0.05 % Apply 1 Application topically 2 (two) times daily. 30 g 11   ferrous sulfate 325 (65 FE) MG tablet Take 325 mg by mouth daily with breakfast.     fluticasone (FLONASE) 50 MCG/ACT nasal spray Place 2 sprays into both nostrils daily. 16 g 0   ibuprofen (ADVIL) 600 MG tablet Take 600 mg by mouth every 6 (six) hours as needed for moderate pain.     losartan (COZAAR) 100 MG tablet Take 1 tablet (100 mg total) by mouth daily. 30 tablet 0   Omega-3 Fatty Acids (FISH OIL PO) Take 1 capsule by mouth daily.     pantoprazole (PROTONIX) 40 MG tablet Take 1 tablet (40 mg total) by mouth 2 (two) times daily before a meal. 60 tablet 3   Current Facility-Administered Medications  Medication Dose Route Frequency Provider Last Rate Last Admin   Gerhardt's butt cream   Topical TID Lazaro Arms, MD        Allergies as of 12/25/2023   (No Known Allergies)    Family History  Problem Relation Age of Onset   Stroke Maternal Grandmother    Hypertension Father    Heart disease Father 68       Valve replacement   Hyperlipidemia Father    Diabetes Mother    Hypertension Mother    Arthritis Mother    Hyperlipidemia Mother    Stroke Mother    Hypertension Brother    Asthma Daughter    Allergies Daughter    Breast cancer Paternal Aunt     Social History   Socioeconomic History   Marital status: Married    Spouse name: Barbara Cower   Number of children: 2   Years of education: 14   Highest education level: Some college, no degree  Occupational History   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  Tobacco Use   Smoking status: Former    Current packs/day: 0.50    Types: Cigarettes    Passive exposure: Current   Smokeless tobacco: Never  Vaping Use   Vaping  status: Never Used  Substance and Sexual Activity   Alcohol use: No   Drug use: No   Sexual activity: Yes    Birth control/protection: Surgical    Comment: tubal  Other Topics Concern   Not on file  Social History Narrative   Lives daughter Howell Rucks is in college, home some weekends   Currently separated from husband   Social Drivers of Health   Financial Resource Strain: Low Risk  (11/17/2023)   Overall Financial Resource Strain (CARDIA)    Difficulty of Paying Living Expenses: Not hard at all  Food Insecurity: No Food Insecurity (11/17/2023)   Hunger Vital Sign    Worried About Running Out of Food in the Last Year: Never true    Ran Out of Food in the Last Year: Never true  Transportation Needs: No Transportation Needs (11/17/2023)  PRAPARE - Administrator, Civil Service (Medical): No    Lack of Transportation (Non-Medical): No  Physical Activity: Insufficiently Active (11/17/2023)   Exercise Vital Sign    Days of Exercise per Week: 3 days    Minutes of Exercise per Session: 30 min  Stress: No Stress Concern Present (11/17/2023)   [employer] of Occupational Health - Occupational Stress Questionnaire    Feeling of Stress : Not at all  Social Connections: Moderately Integrated (11/17/2023)   Social Connection and Isolation Panel [NHANES]    Frequency of Communication with Friends and Family: More than three times a week    Frequency of Social Gatherings with Friends and Family: Once a week    Attends Religious Services: 1 to 4 times per year    Active Member of [employer] or Organizations: No    Attends Banker Meetings: Never    Marital Status: Married    Subjective: Review of Systems  Constitutional:  Negative for chills and fever.  HENT:  Negative for congestion and hearing loss.   Eyes:  Negative for blurred vision and double vision.  Respiratory:  Negative for cough and shortness of breath.   Cardiovascular:  Negative for chest  pain and palpitations.  Gastrointestinal:  Positive for heartburn. Negative for abdominal pain, blood in stool, constipation, diarrhea, melena and vomiting.  Genitourinary:  Negative for dysuria and urgency.  Musculoskeletal:  Negative for joint pain and myalgias.  Skin:  Negative for itching and rash.  Neurological:  Negative for dizziness and headaches.  Psychiatric/Behavioral:  Negative for depression. The patient is not nervous/anxious.      Objective: BP 132/88 (BP Location: Left Arm, Patient Position: Sitting, Cuff Size: Large)   Pulse 71   Temp (!) 97.3 F (36.3 C) (Oral)   Ht 5\' 5"  (1.651 m)   Wt 220 lb (99.8 kg)   LMP 12/15/2023 (Exact Date)   BMI 36.61 kg/m  Physical Exam Constitutional:      Appearance: Normal appearance.  HENT:     Head: Normocephalic and atraumatic.  Eyes:     Extraocular Movements: Extraocular movements intact.     Conjunctiva/sclera: Conjunctivae normal.  Cardiovascular:     Rate and Rhythm: Normal rate and regular rhythm.  Pulmonary:     Effort: Pulmonary effort is normal.     Breath sounds: Normal breath sounds.  Abdominal:     General: Bowel sounds are normal.     Palpations: Abdomen is soft.  Musculoskeletal:        General: No swelling. Normal range of motion.     Cervical back: Normal range of motion and neck supple.  Skin:    General: Skin is warm and dry.     Coloration: Skin is not jaundiced.  Neurological:     General: No focal deficit present.     Mental Status: She is alert and oriented to person, place, and time.  Psychiatric:        Mood and Affect: Mood normal.        Behavior: Behavior normal.      Assessment/Plan:  1.  Diarrhea, mucus in stool-diarrhea is resolved, abdominal pain resolved.  Occasional mucus still apparent.  2 episodes of rectal bleeding which improved with Anusol cream.  Likely benign hemorrhoidal.  Recent reassuring colonoscopy.  I think we can continue to monitor for now.    2.  Chronic  GERD-improved on pantoprazole twice daily.  Will continue.  Follow-up in 3 months or sooner  if needed  12/25/2023 4:02 PM   Disclaimer: This note was dictated with voice recognition software. Similar sounding words can inadvertently be transcribed and may not be corrected upon review.

## 2023-12-25 NOTE — Progress Notes (Signed)
  Subjective:     Patient ID: Miranda Wade, female   DOB: 1981-01-28, 43 y.o.   MRN: [id number]  HPI Bellami is a 43 year old black female, married, G3P2012 back in follow up on recurrent yeast in vagina and vulva and lichen sclerosus, feels better,not itchy.     Component Value Date/Time   DIAGPAP  09/12/2023 0943    - Negative for intraepithelial lesion or malignancy (NILM)   HPVHIGH Negative 09/12/2023 0943   ADEQPAP  09/12/2023 0943    Satisfactory for evaluation; transformation zone component PRESENT.    PCP is Dr Adriana Simas  Review of Systems Feels better, not itchy    Reviewed past medical,surgical, social and family history. Reviewed medications and allergies.  Objective:   Physical Exam BP (!) 146/88 (BP Location: Right Arm, Patient Position: Sitting, Cuff Size: Large)   Pulse 80   Ht 5\' 5"  (1.651 m)   Wt 224 lb 8 oz (101.8 kg)   LMP 12/15/2023 (Exact Date)   BMI 37.36 kg/m     Skin warm and dry.Pelvic: external genitalia is normal in appearance no lesions or rash, has discoloration buttocks, vagina: skin thin at introitus.  Examination chaperoned by Malachy Mood LPN   Upstream - 12/25/23 1346       Pregnancy Intention Screening   Does the patient want to become pregnant in the next year? No    Does the patient's partner want to become pregnant in the next year? No    Would the patient like to discuss contraceptive options today? No      Contraception Wrap Up   Current Method Female Sterilization    End Method Female Sterilization    Contraception Counseling Provided No             Assessment:     1. Candidal vulvovaginitis, recurrent (Primary) Resolved   2. Lichen sclerosus Has temovate to use 2 x weekly as needed     3. Hypertension Take meds and follow up with PCP Plan:     Follow up prn

## 2023-12-25 NOTE — Patient Instructions (Signed)
 I am happy to hear that you are feeling better.  We can continue to monitor for now.  Continue on pantoprazole twice daily for your reflux.  Follow-up in 3 months.  It was very nice seeing you again today.  Dr. Marletta Lor

## 2023-12-29 ENCOUNTER — Ambulatory Visit: Payer: No Typology Code available for payment source | Admitting: Family Medicine

## 2023-12-29 VITALS — BP 132/87 | Ht 65.0 in | Wt 220.4 lb

## 2023-12-29 DIAGNOSIS — R590 Localized enlarged lymph nodes: Secondary | ICD-10-CM

## 2023-12-29 DIAGNOSIS — R222 Localized swelling, mass and lump, trunk: Secondary | ICD-10-CM

## 2023-12-29 NOTE — Patient Instructions (Signed)
 Try not to worry.  We will get you the results as soon as possible.

## 2023-12-29 NOTE — Assessment & Plan Note (Signed)
 Benign.  No concerning features at this time.  Has had normal CBC.

## 2023-12-29 NOTE — Progress Notes (Signed)
 Subjective:  Patient ID: Miranda Wade, female    DOB: Jul 23, 1981  Age: 43 y.o. MRN: [id number]  CC:   Chief Complaint  Patient presents with   Follow-up    HPI:  43 year old female presents for follow-up.  Recent ultrasound of the left upper chest was normal.  There was no evidence of mass or underlying abnormality.  Had a negative chest x-ray as well.  Was recently seen by OB/GYN and mammogram and ultrasound have been ordered.  Patient is still anxious about this.  Additionally, patient still has a small palpable lymph node to the right side of the neck.  Patient Active Problem List   Diagnosis Date Noted   Mass of upper inner quadrant of left breast 12/17/2023   Lichen sclerosus: reduce use to nightly from BID, recheck 6 months 12/17/2023   Localized swelling of chest wall 12/10/2023   Cervical lymphadenopathy 12/01/2023   Cyst, ovary, dermoid, right 12/01/2023   Lichen planus 11/24/2023   GERD (gastroesophageal reflux disease) 01/02/2023   LVH (left ventricular hypertrophy) 07/21/2017   Essential hypertension, benign 04/30/2016   Morbid obesity (HCC) 04/30/2016   Hyperlipidemia 04/30/2016    Social Hx   Social History   Socioeconomic History   Marital status: Married    Spouse name: Barbara Cower   Number of children: 2   Years of education: 14   Highest education level: Some college, no degree  Occupational History   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  Tobacco Use   Smoking status: Former    Current packs/day: 0.50    Types: Cigarettes    Passive exposure: Current   Smokeless tobacco: Never  Vaping Use   Vaping status: Never Used  Substance and Sexual Activity   Alcohol use: No   Drug use: No   Sexual activity: Yes    Birth control/protection: Surgical    Comment: tubal  Other Topics Concern   Not on file  Social History Narrative   Lives daughter Howell Rucks is in college, home some weekends   Currently separated from husband   Social  Drivers of Health   Financial Resource Strain: Low Risk  (11/17/2023)   Overall Financial Resource Strain (CARDIA)    Difficulty of Paying Living Expenses: Not hard at all  Food Insecurity: No Food Insecurity (11/17/2023)   Hunger Vital Sign    Worried About Running Out of Food in the Last Year: Never true    Ran Out of Food in the Last Year: Never true  Transportation Needs: No Transportation Needs (11/17/2023)   PRAPARE - Administrator, Civil Service (Medical): No    Lack of Transportation (Non-Medical): No  Physical Activity: Insufficiently Active (11/17/2023)   Exercise Vital Sign    Days of Exercise per Week: 3 days    Minutes of Exercise per Session: 30 min  Stress: No Stress Concern Present (11/17/2023)   [employer] of Occupational Health - Occupational Stress Questionnaire    Feeling of Stress : Not at all  Social Connections: Moderately Integrated (11/17/2023)   Social Connection and Isolation Panel [NHANES]    Frequency of Communication with Friends and Family: More than three times a week    Frequency of Social Gatherings with Friends and Family: Once a week    Attends Religious Services: 1 to 4 times per year    Active Member of [employer] or Organizations: No    Attends Banker Meetings: Never    Marital  Status: Married    Review of Systems Per HPI  Objective:  BP 132/87   Ht 5\' 5"  (1.651 m)   Wt 220 lb 6.4 oz (100 kg)   LMP 12/15/2023 (Exact Date)   BMI 36.68 kg/m      12/29/2023    2:37 PM 12/25/2023    3:27 PM 12/25/2023    1:59 PM  BP/Weight  Systolic BP 132 132 146  Diastolic BP 87 88 88  Wt. (Lbs) 220.4 220   BMI 36.68 kg/m2 36.61 kg/m2     Physical Exam Vitals and nursing note reviewed.  Constitutional:      General: She is not in acute distress.    Appearance: Normal appearance.  HENT:     Head: Normocephalic and atraumatic.  Cardiovascular:     Rate and Rhythm: Normal rate and regular rhythm.  Pulmonary:     Effort:  Pulmonary effort is normal.     Breath sounds: Normal breath sounds.  Neurological:     Mental Status: She is alert.  Psychiatric:     Comments: Anxious.     Lab Results  Component Value Date   WBC 8.9 11/27/2023   HGB 11.5 (L) 11/27/2023   HCT 34.2 (L) 11/27/2023   PLT 310 11/27/2023   GLUCOSE 100 (H) 12/08/2023   CHOL 195 11/24/2023   TRIG 66 11/24/2023   HDL 40 11/24/2023   LDLCALC 143 (H) 11/24/2023   ALT 14 11/24/2023   AST 13 11/24/2023   NA 140 12/08/2023   K 4.0 12/08/2023   CL 103 12/08/2023   CREATININE 0.70 12/08/2023   BUN 10 12/08/2023   CO2 22 12/08/2023   TSH 1.430 11/28/2023   HGBA1C 5.7 (H) 04/17/2023     Assessment & Plan:  Localized swelling of chest wall Assessment & Plan: Mammogram and ultrasound scheduled for later this week.   Cervical lymphadenopathy Assessment & Plan: Benign.  No concerning features at this time.  Has had normal CBC.    Follow-up:  Return if symptoms worsen or fail to improve.  Everlene Other DO [facility]

## 2023-12-29 NOTE — Assessment & Plan Note (Signed)
 Mammogram and ultrasound scheduled for later this week.

## 2024-01-01 ENCOUNTER — Ambulatory Visit (HOSPITAL_COMMUNITY)
Admission: RE | Admit: 2024-01-01 | Discharge: 2024-01-01 | Disposition: A | Discharge status: Home / Self Care | Payer: No Typology Code available for payment source | Source: Ambulatory Visit | Attending: Adult Health | Admitting: Adult Health

## 2024-01-01 ENCOUNTER — Encounter (HOSPITAL_COMMUNITY): Payer: Self-pay

## 2024-01-01 ENCOUNTER — Other Ambulatory Visit (HOSPITAL_COMMUNITY): Payer: Self-pay | Admitting: Adult Health

## 2024-01-01 DIAGNOSIS — R928 Other abnormal and inconclusive findings on diagnostic imaging of breast: Secondary | ICD-10-CM

## 2024-01-01 DIAGNOSIS — N6322 Unspecified lump in the left breast, upper inner quadrant: Secondary | ICD-10-CM | POA: Diagnosis present

## 2024-01-05 ENCOUNTER — Other Ambulatory Visit: Payer: Self-pay | Admitting: Physician Assistant

## 2024-01-05 ENCOUNTER — Other Ambulatory Visit (HOSPITAL_COMMUNITY): Payer: Self-pay | Admitting: Adult Health

## 2024-01-05 DIAGNOSIS — I10 Essential (primary) hypertension: Secondary | ICD-10-CM

## 2024-01-05 DIAGNOSIS — R928 Other abnormal and inconclusive findings on diagnostic imaging of breast: Secondary | ICD-10-CM

## 2024-01-06 ENCOUNTER — Ambulatory Visit (HOSPITAL_COMMUNITY)
Admission: RE | Admit: 2024-01-06 | Discharge: 2024-01-06 | Disposition: A | Discharge status: Home / Self Care | Source: Ambulatory Visit | Attending: Adult Health | Admitting: Adult Health

## 2024-01-06 ENCOUNTER — Encounter (HOSPITAL_COMMUNITY): Payer: Self-pay

## 2024-01-06 DIAGNOSIS — N6489 Other specified disorders of breast: Secondary | ICD-10-CM | POA: Diagnosis not present

## 2024-01-06 DIAGNOSIS — R928 Other abnormal and inconclusive findings on diagnostic imaging of breast: Secondary | ICD-10-CM

## 2024-01-06 DIAGNOSIS — R920 Mammographic microcalcification found on diagnostic imaging of breast: Secondary | ICD-10-CM | POA: Diagnosis not present

## 2024-01-06 HISTORY — PX: BREAST BIOPSY: SHX20

## 2024-01-06 MED ORDER — LIDOCAINE-EPINEPHRINE (PF) 1 %-1:200000 IJ SOLN
10.0000 mL | Freq: Once | INTRAMUSCULAR | Status: AC
  Administered 2024-01-06: 10 mL via INTRADERMAL

## 2024-01-06 MED ORDER — LIDOCAINE HCL (PF) 2 % IJ SOLN
10.0000 mL | Freq: Once | INTRAMUSCULAR | Status: AC
  Administered 2024-01-06: 10 mL

## 2024-01-06 NOTE — Progress Notes (Signed)
 PT tolerated Left breast biopsy well today with NAD noted. PT verbalized understanding of discharge instructions. PT ambulated back to the mammogram area this time and given ice packs to use. Specimens taken to lab by Lexi from ultrasound.

## 2024-01-07 LAB — SURGICAL PATHOLOGY

## 2024-02-02 ENCOUNTER — Encounter: Payer: Self-pay | Admitting: Family Medicine

## 2024-02-02 ENCOUNTER — Other Ambulatory Visit: Payer: Self-pay | Admitting: Family Medicine

## 2024-02-02 DIAGNOSIS — R590 Localized enlarged lymph nodes: Secondary | ICD-10-CM

## 2024-02-12 ENCOUNTER — Ambulatory Visit: Payer: Self-pay | Admitting: Internal Medicine

## 2024-03-18 ENCOUNTER — Encounter: Payer: Self-pay | Admitting: Internal Medicine

## 2024-03-31 ENCOUNTER — Other Ambulatory Visit: Payer: Self-pay | Admitting: Family Medicine

## 2024-03-31 DIAGNOSIS — I10 Essential (primary) hypertension: Secondary | ICD-10-CM

## 2024-04-01 ENCOUNTER — Encounter (INDEPENDENT_AMBULATORY_CARE_PROVIDER_SITE_OTHER): Payer: Self-pay | Admitting: Otolaryngology

## 2024-04-01 ENCOUNTER — Ambulatory Visit (INDEPENDENT_AMBULATORY_CARE_PROVIDER_SITE_OTHER): Admitting: Otolaryngology

## 2024-04-01 VITALS — BP 158/110 | HR 78

## 2024-04-01 DIAGNOSIS — R590 Localized enlarged lymph nodes: Secondary | ICD-10-CM | POA: Diagnosis not present

## 2024-04-01 NOTE — Progress Notes (Signed)
 ENT CONSULT:  Reason for Consult: cervical lymphadenopathy    HPI: Discussed the use of AI scribe software for clinical note transcription with the patient, who gave verbal consent to proceed.  History of Present Illness  44 yoF hx of small deep knot along the right posterior neck. No fevers, chills, no weight changes. She noticed it herself. He also reports feeling a sub-cm round prominent below her ear. No pain. No imaging.      Past Medical History:  Diagnosis Date   Anemia    Essential hypertension    Hyperlipidemia    Seasonal allergies     Past Surgical History:  Procedure Laterality Date   BIOPSY  04/29/2022   Procedure: BIOPSY;  Surgeon: Vinetta Greening, DO;  [facility];  Service: Endoscopy;;   BREAST BIOPSY Left 01/06/2024   path pending   BREAST BIOPSY Left 01/06/2024   US  LT BREAST BX W LOC DEV 1ST LESION IMG BX SPEC US  GUIDE 01/06/2024 Alger Infield, MD [facility]   COLONOSCOPY WITH PROPOFOL  N/A 04/29/2022   Surgeon: Vinetta Greening, DO;  nonbleeding internal hemorrhoids, otherwise normal exam.   ESOPHAGOGASTRODUODENOSCOPY (EGD) WITH PROPOFOL  N/A 04/29/2022   Surgeon: Vinetta Greening, DO;  cm hiatal hernia, grade a reflux esophagitis, gastritis biopsied, single duodenal polyp resected and retrieved.  Pathology showed fragments of gastric mucosa with minimal vascular ectasia, no H. pylori.  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   KNEE ARTHROSCOPY WITH SUBCHONDROPLASTY Right 06/20/2023   Procedure: KNEE ARTHROSCOPY, PARTIAL MEDIAL MENISCECTOMY WITH MEDIAL TIBIA SUBCHONDROPLASTY;  Surgeon: Janeth Medicus, MD;  Location: [facility];  Service: Orthopedics;  Laterality: Right;   LAPAROSCOPIC BILATERAL SALPINGECTOMY Bilateral 05/01/2022   Procedure: LAPAROSCOPIC BILATERAL SALPINGECTOMY;  Surgeon: Wendelyn Halter, MD;   Location: [facility];  Service: Gynecology;  Laterality: Bilateral;   MASS EXCISION Right 08/04/2017   Procedure: EXCISION OF RIGHT NASAL  MASS;  Surgeon: Reynold Caves, MD;  Location: [facility];  Service: ENT;  Laterality: Right;  LOCAL   POLYPECTOMY  04/29/2022   Procedure: POLYPECTOMY;  Surgeon: Vinetta Greening, DO;  [facility];  Service: Endoscopy;;   REMOVAL OF NON VAGINAL CONTRACEPTIVE DEVICE N/A 05/01/2022   Procedure: REMOVAL OF NON VAGINAL CONTRACEPTIVE DEVICE;  Surgeon: Wendelyn Halter, MD;  Location: [facility];  Service: Gynecology;  Laterality: N/A;   WRIST SURGERY      Family History  Problem Relation Age of Onset   Stroke Maternal Grandmother    Hypertension Father    Heart disease Father 75       Valve replacement   Hyperlipidemia Father    Diabetes Mother    Hypertension Mother    Arthritis Mother    Hyperlipidemia Mother    Stroke Mother    Hypertension Brother    Asthma Daughter    Allergies Daughter    Breast cancer Paternal Aunt     Social History:  reports that she has quit smoking. Her smoking use included cigarettes. She has been exposed to tobacco smoke. She has never used smokeless tobacco. She reports that she does not drink alcohol and does not use drugs.  Allergies: No Known Allergies  Medications: I have reviewed the patient's current medications.  The PMH, PSH, Medications, Allergies, and SH were reviewed and updated.  ROS: Constitutional: Negative for fever, weight loss and weight gain. Cardiovascular: Negative for chest pain and dyspnea  on exertion. Respiratory: Is not experiencing shortness of breath at rest. Gastrointestinal: Negative for nausea and vomiting. Neurological: Negative for headaches. Psychiatric: The patient is not nervous/anxious  Blood pressure (!) 158/110, pulse 78, SpO2 97%. There is no height or weight on file to calculate BMI.  PHYSICAL EXAM:  Exam: General: Well-developed,  well-nourished Respiratory Respiratory effort: Equal inspiration and expiration without stridor Cardiovascular Peripheral Vascular: Warm extremities with equal color/perfusion Eyes: No nystagmus with equal extraocular motion bilaterally Neuro/Psych/Balance: Patient oriented to person, place, and time; Appropriate mood and affect; Gait is intact with no imbalance; Cranial nerves I-XII are intact Head and Face Inspection: Normocephalic and atraumatic without mass or lesion Palpation: Facial skeleton intact without bony stepoffs Salivary Glands: No mass or tenderness Facial Strength: Facial motility symmetric and full bilaterally ENT Pinna: External ear intact and fully developed External canal: Canal is patent with intact skin Tympanic Membrane: Clear and mobile External Nose: No scar or anatomic deformity Internal Nose: Septum is relatively straight  Lips, Teeth, and gums: Mucosa and teeth intact and viable TMJ: No pain to palpation with full mobility Oral cavity/oropharynx: No erythema or exudate, no lesions present Neck Neck and Trachea: Midline trachea without mass or lesion Thyroid: No mass or nodularity Lymphatics: No lymphadenopathy Posterior neck right side - 1 cm palpable node posterior to SCM in order to feel have to perform deep palpation   Assessment/Plan: Encounter Diagnosis  Name Primary?   Cervical lymphadenopathy Yes    Assessment & Plan  Small palpable node posterior right neck, deep and not visible or palpable on the surface. No concerning sx.  - neck U/S - will call with results - if normal - obtain repeat neck U/S 12 mo with PCP otherwise no interventions     Thank you for allowing me to participate in the care of this patient. Please do not hesitate to contact me with any questions or concerns.   Artice Last, MD Otolaryngology [facility] Phone: [phone] Fax: [phone]    04/01/2024, 2:10 PM

## 2024-04-04 ENCOUNTER — Encounter: Payer: Self-pay | Admitting: Family Medicine

## 2024-04-08 ENCOUNTER — Ambulatory Visit (HOSPITAL_COMMUNITY)
Admission: RE | Admit: 2024-04-08 | Discharge: 2024-04-08 | Disposition: A | Discharge status: Home / Self Care | Source: Ambulatory Visit | Attending: Otolaryngology | Admitting: Otolaryngology

## 2024-04-08 DIAGNOSIS — R590 Localized enlarged lymph nodes: Secondary | ICD-10-CM | POA: Diagnosis not present

## 2024-04-12 ENCOUNTER — Telehealth (INDEPENDENT_AMBULATORY_CARE_PROVIDER_SITE_OTHER): Payer: Self-pay

## 2024-04-12 NOTE — Telephone Encounter (Signed)
 Patient called concerned about getting her results. I told her that her results had been read and that they would be interpreted for her when Dr. Soldatova returns.

## 2024-04-13 ENCOUNTER — Ambulatory Visit: Admitting: Family Medicine

## 2024-04-13 ENCOUNTER — Encounter: Payer: Self-pay | Admitting: Family Medicine

## 2024-04-13 ENCOUNTER — Telehealth (INDEPENDENT_AMBULATORY_CARE_PROVIDER_SITE_OTHER): Payer: Self-pay

## 2024-04-13 ENCOUNTER — Ambulatory Visit: Payer: Self-pay

## 2024-04-13 VITALS — BP 145/90 | HR 68 | Temp 98.0°F | Ht 65.0 in | Wt 217.0 lb

## 2024-04-13 DIAGNOSIS — I10 Essential (primary) hypertension: Secondary | ICD-10-CM

## 2024-04-13 MED ORDER — AMLODIPINE BESYLATE 10 MG PO TABS: 10.0000 mg | ORAL_TABLET | Freq: Every day | ORAL | 3 refills | Status: AC

## 2024-04-13 NOTE — Telephone Encounter (Signed)
 FYI Only or Action Required?: FYI only for provider.  Patient was last seen in primary care on 12/29/2023 by Cook, Jayce G, DO. Called Nurse Triage reporting Hypertension. Symptoms began several days ago. Interventions attempted: Nothing. Symptoms are: unchanged.  Triage Disposition: See PCP Within 2 Weeks  Patient/caregiver understands and will follow disposition?: Yes, will follow disposition  Copied from CRM [id number]. Topic: Clinical - Red Word Triage >> Apr 13, 2024  8:07 AM Miranda Wade wrote: Red Word that prompted transfer to Nurse Triage: BP 154/110 no symptoms Reason for Disposition  [1] Systolic BP  >= 130 OR Diastolic >= 80 AND [2] taking BP medications  Answer Assessment - Initial Assessment Questions 1. BLOOD PRESSURE: What is the blood pressure? Did you take at least two measurements 5 minutes apart?     857/07,851/06 2. ONSET: When did you take your blood pressure?     Couple weeks,  3. HOW: How did you take your blood pressure? (e.g., automatic home BP monitor, visiting nurse)     auto 4. HISTORY: Do you have a history of high blood pressure?     yes 5. MEDICINES: Are you taking any medicines for blood pressure? Have you missed any doses recently?     Yes, no missed dose 6. OTHER SYMPTOMS: Do you have any symptoms? (e.g., blurred vision, chest pain, difficulty breathing, headache, weakness)     Pt states that my chest been hurting for a couple days at night, no CP currently.  Protocols used: Blood Pressure - High-A-AH

## 2024-04-13 NOTE — Assessment & Plan Note (Signed)
 BP mildly elevated here today.  Home readings have been elevated similarly.  Increasing amlodipine .

## 2024-04-13 NOTE — Patient Instructions (Signed)
 Norvasc  increased.  Have a safe trip.  Take care  Dr. Bluford

## 2024-04-13 NOTE — Progress Notes (Signed)
 Subjective:  Patient ID: Miranda Wade, female    DOB: 10-03-1981  Age: 43 y.o. MRN: [id number]  CC:   Chief Complaint  Patient presents with   Hypertension    Elevated BP readings at home. Taking medications as prescribed.     HPI:  43 year old female presents with concerns regarding her blood pressure.  Patient states that she has had several readings at home which have been elevated.  She is compliant with amlodipine  5 mg daily and losartan  100 mg daily.  She is overall feeling well.  However, she is going on a trip to [place] and is concerned about her blood pressure being elevated.  She would like to discuss this today.  Patient Active Problem List   Diagnosis Date Noted   Mass of upper inner quadrant of left breast 12/17/2023   Lichen sclerosus: reduce use to nightly from BID, recheck 6 months 12/17/2023   Localized swelling of chest wall 12/10/2023   Cyst, ovary, dermoid, right 12/01/2023   Lichen planus 11/24/2023   GERD (gastroesophageal reflux disease) 01/02/2023   LVH (left ventricular hypertrophy) 07/21/2017   Essential hypertension, benign 04/30/2016   Hyperlipidemia 04/30/2016    Social Hx   Social History   Socioeconomic History   Marital status: Married    Spouse name: Selinda   Number of children: 2   Years of education: 14   Highest education level: Some college, no degree  Occupational History   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  Tobacco Use   Smoking status: Former    Current packs/day: 0.50    Types: Cigarettes    Passive exposure: Current   Smokeless tobacco: Never  Vaping Use   Vaping status: Never Used  Substance and Sexual Activity   Alcohol use: No   Drug use: No   Sexual activity: Yes    Birth control/protection: Surgical    Comment: tubal  Other Topics Concern   Not on file  Social History Narrative   Lives daughter Verna Kand is in college, home some weekends   Currently separated from husband   Social  Drivers of Health   Financial Resource Strain: Low Risk  (11/17/2023)   Overall Financial Resource Strain (CARDIA)    Difficulty of Paying Living Expenses: Not hard at all  Food Insecurity: No Food Insecurity (11/17/2023)   Hunger Vital Sign    Worried About Running Out of Food in the Last Year: Never true    Ran Out of Food in the Last Year: Never true  Transportation Needs: No Transportation Needs (11/17/2023)   PRAPARE - Administrator, Civil Service (Medical): No    Lack of Transportation (Non-Medical): No  Physical Activity: Insufficiently Active (11/17/2023)   Exercise Vital Sign    Days of Exercise per Week: 3 days    Minutes of Exercise per Session: 30 min  Stress: No Stress Concern Present (11/17/2023)   [employer] of Occupational Health - Occupational Stress Questionnaire    Feeling of Stress : Not at all  Social Connections: Moderately Integrated (11/17/2023)   Social Connection and Isolation Panel    Frequency of Communication with Friends and Family: More than three times a week    Frequency of Social Gatherings with Friends and Family: Once a week    Attends Religious Services: 1 to 4 times per year    Active Member of [employer] or Organizations: No    Attends Banker Meetings: Never  Marital Status: Married    Review of Systems Per HPI  Objective:  BP (!) 145/90   Pulse 68   Temp 98 F (36.7 C)   Ht 5' 5 (1.651 m)   Wt 217 lb (98.4 kg)   SpO2 100%   BMI 36.11 kg/m      04/13/2024    9:14 AM 04/01/2024    1:51 PM 12/29/2023    2:37 PM  BP/Weight  Systolic BP 145 158 132  Diastolic BP 90 110 87  Wt. (Lbs) 217  220.4  BMI 36.11 kg/m2  36.68 kg/m2    Physical Exam Vitals and nursing note reviewed.  Constitutional:      General: She is not in acute distress.    Appearance: Normal appearance.  HENT:     Head: Normocephalic and atraumatic.   Cardiovascular:     Rate and Rhythm: Normal rate and regular rhythm.  Pulmonary:      Effort: Pulmonary effort is normal.     Breath sounds: Normal breath sounds.   Neurological:     Mental Status: She is alert.   Psychiatric:     Comments: Anxious.     Lab Results  Component Value Date   WBC 8.9 11/27/2023   HGB 11.5 (L) 11/27/2023   HCT 34.2 (L) 11/27/2023   PLT 310 11/27/2023   GLUCOSE 100 (H) 12/08/2023   CHOL 195 11/24/2023   TRIG 66 11/24/2023   HDL 40 11/24/2023   LDLCALC 143 (H) 11/24/2023   ALT 14 11/24/2023   AST 13 11/24/2023   NA 140 12/08/2023   K 4.0 12/08/2023   CL 103 12/08/2023   CREATININE 0.70 12/08/2023   BUN 10 12/08/2023   CO2 22 12/08/2023   TSH 1.430 11/28/2023   HGBA1C 5.7 (H) 04/17/2023     Assessment & Plan:  Essential hypertension, benign Assessment & Plan: BP mildly elevated here today.  Home readings have been elevated similarly.  Increasing amlodipine .  Orders: -     amLODIPine  Besylate; Take 1 tablet (10 mg total) by mouth daily.  Dispense: 90 tablet; Refill: 3   Follow-up:  Return in about 3 months (around 07/14/2024).  Jacqulyn Ahle DO [facility]

## 2024-04-13 NOTE — Telephone Encounter (Signed)
 Let patient know about her scan results. Patient understood.

## 2024-04-20 ENCOUNTER — Ambulatory Visit (HOSPITAL_BASED_OUTPATIENT_CLINIC_OR_DEPARTMENT_OTHER)
Admission: RE | Admit: 2024-04-20 | Discharge: 2024-04-20 | Disposition: A | Discharge status: Home / Self Care | Source: Ambulatory Visit | Attending: Adult Health | Admitting: Adult Health

## 2024-04-20 DIAGNOSIS — D270 Benign neoplasm of right ovary: Secondary | ICD-10-CM | POA: Insufficient documentation

## 2024-04-20 DIAGNOSIS — N838 Other noninflammatory disorders of ovary, fallopian tube and broad ligament: Secondary | ICD-10-CM | POA: Diagnosis not present

## 2024-04-27 ENCOUNTER — Telehealth: Payer: Self-pay | Admitting: *Deleted

## 2024-04-27 ENCOUNTER — Ambulatory Visit
Admission: EM | Admit: 2024-04-27 | Discharge: 2024-04-27 | Disposition: A | Discharge status: Home / Self Care | Source: Ambulatory Visit | Attending: Nurse Practitioner | Admitting: Nurse Practitioner

## 2024-04-27 DIAGNOSIS — R197 Diarrhea, unspecified: Secondary | ICD-10-CM | POA: Diagnosis not present

## 2024-04-27 DIAGNOSIS — I10 Essential (primary) hypertension: Secondary | ICD-10-CM

## 2024-04-27 LAB — POCT URINALYSIS DIP (MANUAL ENTRY)
Bilirubin, UA: NEGATIVE
Glucose, UA: NEGATIVE mg/dL
Leukocytes, UA: NEGATIVE
Nitrite, UA: NEGATIVE
Protein Ur, POC: 30 mg/dL — AB
Spec Grav, UA: 1.025 (ref 1.010–1.025)
Urobilinogen, UA: 0.2 U/dL
pH, UA: 5.5 (ref 5.0–8.0)

## 2024-04-27 LAB — POC COVID19/FLU A&B COMBO
Covid Antigen, POC: NEGATIVE
Influenza A Antigen, POC: NEGATIVE
Influenza B Antigen, POC: NEGATIVE

## 2024-04-27 MED ORDER — DIPHENOXYLATE-ATROPINE 2.5-0.025 MG PO TABS: 1.0000 | ORAL_TABLET | Freq: Four times a day (QID) | ORAL | 0 refills | Status: DC | PRN

## 2024-04-27 NOTE — Discharge Instructions (Addendum)
 The results from your stool sample and urine culture are pending.  You will be contacted if the pending test results are abnormal.  You will also access to your results via MyChart. Your COVID test was negative today. Take medication as prescribed.  Recommend continuing Imodium before starting the medication.  Use the medication if the Imodium is ineffective. Make sure you are drinking plenty of fluids.  Recommend Pedialyte or Gatorade to prevent dehydration. Recommend dietary modifications to help decrease your diarrhea.  I provided information regarding food choices to help with your current symptoms. Go to the emergency department immediately if you continue to have diarrhea unrelieved by the medication prescribed, if you develop fever, worsening abdominal pain, or other concerns. Follow-up as needed.

## 2024-04-27 NOTE — ED Provider Notes (Signed)
 [facility]    CSN: [id number] Arrival date & time: 04/27/24  1004      History   Chief Complaint No chief complaint on file.   HPI Miranda Wade is a 43 y.o. female.   The history is provided by the patient.   Patient presents with a 3-day history of diarrhea, and abdominal pain.  Patient states symptoms started while she was on vacation in [place].  States that she does not recall eating any abnormal foods or drinking the water there.  States that she did buy the water when she arrived to the country.  States that she is already experienced 3-4 episodes of diarrhea this morning.  She states that she felt like she developed chills while she was on the plane returning home, states that she returned home this morning.  She also endorses episodes of feeling as if her abdomen is in knots immediately prior to the diarrhea episodes.  Patient denies fever, headache, ear pain, upper respiratory symptoms, nausea or vomiting.  States that she did take Imodium A-D for her symptoms which has slowed her diarrhea.  Past Medical History:  Diagnosis Date   Anemia    Essential hypertension    Hyperlipidemia    Seasonal allergies     Patient Active Problem List   Diagnosis Date Noted   Mass of upper inner quadrant of left breast 12/17/2023   Lichen sclerosus: reduce use to nightly from BID, recheck 6 months 12/17/2023   Localized swelling of chest wall 12/10/2023   Cyst, ovary, dermoid, right 12/01/2023   Lichen planus 11/24/2023   GERD (gastroesophageal reflux disease) 01/02/2023   LVH (left ventricular hypertrophy) 07/21/2017   Essential hypertension, benign 04/30/2016   Hyperlipidemia 04/30/2016    Past Surgical History:  Procedure Laterality Date   BIOPSY  04/29/2022   Procedure: BIOPSY;  Surgeon: Cindie Carlin POUR, DO;  [facility];  Service: Endoscopy;;   BREAST BIOPSY Left 01/06/2024   path pending   BREAST BIOPSY Left 01/06/2024   US  LT BREAST BX W LOC  DEV 1ST LESION IMG BX SPEC US  GUIDE 01/06/2024 Lennon Nest, MD [facility]   COLONOSCOPY WITH PROPOFOL  N/A 04/29/2022   Surgeon: Cindie Carlin POUR, DO;  nonbleeding internal hemorrhoids, otherwise normal exam.   ESOPHAGOGASTRODUODENOSCOPY (EGD) WITH PROPOFOL  N/A 04/29/2022   Surgeon: Cindie Carlin POUR, DO;  cm hiatal hernia, grade a reflux esophagitis, gastritis biopsied, single duodenal polyp resected and retrieved.  Pathology showed fragments of gastric mucosa with minimal vascular ectasia, no H. pylori.  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   KNEE ARTHROSCOPY WITH SUBCHONDROPLASTY Right 06/20/2023   Procedure: KNEE ARTHROSCOPY, PARTIAL MEDIAL MENISCECTOMY WITH MEDIAL TIBIA SUBCHONDROPLASTY;  Surgeon: Sharl Selinda Dover, MD;  Location: [facility];  Service: Orthopedics;  Laterality: Right;   LAPAROSCOPIC BILATERAL SALPINGECTOMY Bilateral 05/01/2022   Procedure: LAPAROSCOPIC BILATERAL SALPINGECTOMY;  Surgeon: Jayne Vonn DEL, MD;  Location: [facility];  Service: Gynecology;  Laterality: Bilateral;   MASS EXCISION Right 08/04/2017   Procedure: EXCISION OF RIGHT NASAL  MASS;  Surgeon: Karis Clunes, MD;  Location: [facility];  Service: ENT;  Laterality: Right;  LOCAL   POLYPECTOMY  04/29/2022   Procedure: POLYPECTOMY;  Surgeon: Cindie Carlin POUR, DO;  [facility];  Service: Endoscopy;;   REMOVAL OF NON VAGINAL CONTRACEPTIVE DEVICE N/A 05/01/2022   Procedure: REMOVAL OF NON VAGINAL CONTRACEPTIVE DEVICE;  Surgeon: Jayne,  Vonn DEL, MD;  Location: [facility];  Service: Gynecology;  Laterality: N/A;   WRIST SURGERY      OB History     Gravida  3   Para  2   Term  2   Preterm      AB  1   Living  2      SAB      IAB      Ectopic      Multiple      Live Births  1            Home Medications    Prior to Admission medications    Medication Sig Start Date End Date Taking? Authorizing Provider  diphenoxylate -atropine  (LOMOTIL ) 2.5-0.025 MG tablet Take 1 tablet by mouth 4 (four) times daily as needed for diarrhea or loose stools. 04/27/24  Yes Leath-Warren, Etta PARAS, NP  amLODipine  (NORVASC ) 10 MG tablet Take 1 tablet (10 mg total) by mouth daily. 04/13/24   Cook, Jayce G, DO  Ascorbic Acid (VITAMIN C PO) Take 1 tablet by mouth daily.    [provider]  atorvastatin  (LIPITOR) 80 MG tablet Take 1 tablet by mouth once daily 03/31/24   Cook, Jayce G, DO  clobetasol  cream (TEMOVATE ) 0.05 % Apply 1 Application topically 2 (two) times daily. 10/03/23   Jayne Vonn DEL, MD  ferrous sulfate 325 (65 FE) MG tablet Take 325 mg by mouth daily with breakfast.    [provider]  fluticasone  (FLONASE ) 50 MCG/ACT nasal spray Place 2 sprays into both nostrils daily. 10/08/22   Leath-Warren, Etta PARAS, NP  ibuprofen  (ADVIL ) 600 MG tablet Take 600 mg by mouth every 6 (six) hours as needed for moderate pain.    [provider]  losartan  (COZAAR ) 100 MG tablet Take 1 tablet by mouth once daily 03/31/24   Cook, Jayce G, DO  Omega-3 Fatty Acids (FISH OIL PO) Take 1 capsule by mouth daily.    [provider]  pantoprazole  (PROTONIX ) 40 MG tablet Take 1 tablet (40 mg total) by mouth 2 (two) times daily before a meal. 11/28/23   Rudy Josette RAMAN, PA-C    Family History Family History  Problem Relation Age of Onset   Stroke Maternal Grandmother    Hypertension Father    Heart disease Father 81       Valve replacement   Hyperlipidemia Father    Diabetes Mother    Hypertension Mother    Arthritis Mother    Hyperlipidemia Mother    Stroke Mother    Hypertension Brother    Asthma Daughter    Allergies Daughter    Breast cancer Paternal Aunt     Social History Social History   Tobacco Use   Smoking status: Former    Current packs/day: 0.50    Types: Cigarettes    Passive exposure: Current    Smokeless tobacco: Never  Vaping Use   Vaping status: Never Used  Substance Use Topics   Alcohol use: No   Drug use: No     Allergies   Patient has no known allergies.   Review of Systems Review of Systems Per HPI  Physical Exam Triage Vital Signs ED Triage Vitals  Encounter Vitals Group     BP 04/27/24 1010 (!) 148/89     Girls Systolic BP Percentile --      Girls Diastolic BP Percentile --      Boys Systolic BP Percentile --      Boys Diastolic  BP Percentile --      Pulse Rate 04/27/24 1010 72     Resp 04/27/24 1010 18     Temp 04/27/24 1010 98.2 F (36.8 C)     Temp Source 04/27/24 1010 Oral     SpO2 04/27/24 1010 97 %     Weight --      Height --      Head Circumference --      Peak Flow --      Pain Score 04/27/24 1013 0     Pain Loc --      Pain Education --      Exclude from Growth Chart --    No data found.  Updated Vital Signs BP (!) 148/89 (BP Location: Right Arm)   Pulse 72   Temp 98.2 F (36.8 C) (Oral)   Resp 18   SpO2 97%   Visual Acuity Right Eye Distance:   Left Eye Distance:   Bilateral Distance:    Right Eye Near:   Left Eye Near:    Bilateral Near:     Physical Exam Vitals and nursing note reviewed.  Constitutional:      Appearance: Normal appearance.  HENT:     Mouth/Throat:     Mouth: Mucous membranes are moist.  Eyes:     Extraocular Movements: Extraocular movements intact.     Pupils: Pupils are equal, round, and reactive to light.  Cardiovascular:     Rate and Rhythm: Normal rate and regular rhythm.     Pulses: Normal pulses.     Heart sounds: Normal heart sounds.  Pulmonary:     Effort: Pulmonary effort is normal. No respiratory distress.     Breath sounds: Normal breath sounds. No stridor. No wheezing, rhonchi or rales.  Abdominal:     General: Bowel sounds are normal.     Palpations: Abdomen is soft.     Tenderness: There is no abdominal tenderness.  Musculoskeletal:     Cervical back: Normal range of motion.   Skin:    General: Skin is warm and dry.  Neurological:     General: No focal deficit present.     Mental Status: She is alert and oriented to person, place, and time.  Psychiatric:        Mood and Affect: Mood normal.        Behavior: Behavior normal.      UC Treatments / Results  Labs (all labs ordered are listed, but only abnormal results are displayed) Labs Reviewed  POCT URINALYSIS DIP (MANUAL ENTRY) - Abnormal; Notable for the following components:      Result Value   Ketones, POC UA trace (5) (*)    Blood, UA moderate (*)    Protein Ur, POC =30 (*)    All other components within normal limits  URINE CULTURE  GI PROFILE, STOOL, PCR  POC COVID19/FLU A&B COMBO    EKG   Radiology No results found.  Procedures Procedures (including critical care time)  Medications Ordered in UC Medications - No data to display  Initial Impression / Assessment and Plan / UC Course  I have reviewed the triage vital signs and the nursing notes.  Pertinent labs & imaging results that were available during my care of the patient were reviewed by me and considered in my medical decision making (see chart for details).  Patient with a 3-day history of diarrhea and abdominal pain.  Patient has been taking Imodium A-D which has  appear to  slow her diarrhea.  On exam, lung sounds are clear throughout, room air sats at 97%.  She does not have any abdominal tenderness and is not in any acute distress, symptoms are not consistent with acute abdomen at this time.  GI profile has been ordered.  COVID test was negative, urinalysis was positive for protein and blood, patient states that she had her last menses 2 to 3 days ago.  Symptoms most likely of viral etiology if the GI panel is negative.  Will have patient continue Imodium AD for diarrhea, Lomotil  2.5-0.025 mg tablets prescribed for more severe diarrhea episodes.  GI panel is pending, will treat accordingly when results are received.  Supportive  care recommendations were provided discussed with the patient to include over-the-counter analgesics, Pedialyte or Gatorade to prevent dehydration, and dietary changes to help decrease diarrhea symptoms.  Discussed indications with patient regarding ER follow-up.  Patient was in agreement with this plan of care and verbalizes understanding.  All questions were answered.  Patient stable for discharge.  Final Clinical Impressions(s) / UC Diagnoses   Final diagnoses:  Diarrhea, unspecified type     Discharge Instructions      The results from your stool sample and urine culture are pending.  You will be contacted if the pending test results are abnormal.  You will also access to your results via MyChart. Your COVID test was negative today. Take medication as prescribed.  Recommend continuing Imodium before starting the medication.  Use the medication if the Imodium is ineffective. Make sure you are drinking plenty of fluids.  Recommend Pedialyte or Gatorade to prevent dehydration. Recommend dietary modifications to help decrease your diarrhea.  I provided information regarding food choices to help with your current symptoms. Go to the emergency department immediately if you continue to have diarrhea unrelieved by the medication prescribed, if you develop fever, worsening abdominal pain, or other concerns. Follow-up as needed.      ED Prescriptions     Medication Sig Dispense Auth. Provider   diphenoxylate -atropine  (LOMOTIL ) 2.5-0.025 MG tablet Take 1 tablet by mouth 4 (four) times daily as needed for diarrhea or loose stools. 20 tablet Leath-Warren, Etta PARAS, NP      I have reviewed the PDMP during this encounter.   Gilmer Etta PARAS, NP 04/27/24 1100

## 2024-04-27 NOTE — ED Triage Notes (Signed)
 Pt reports diarrhea and nausea after being out of the country, stool is like water, abdominal pain feels like stomach is in knots x 3 days

## 2024-04-27 NOTE — Progress Notes (Signed)
 Complex Care Management Note  Care Guide Note 04/27/2024 Name: TEYONNA PLAISTED MRN: [id number] DOB: 01-Dec-1980  RUCHEL BRANDENBURGER is a 43 y.o. year old female who sees Cook, Jayce G, DO for primary care. I reached out to [name] by phone today to offer complex care management services.  Ms. Grego was given information about Complex Care Management services today including:   The Complex Care Management services include support from the care team which includes your Nurse Care Manager, Clinical Social Worker, or Pharmacist.  The Complex Care Management team is here to help remove barriers to the health concerns and goals most important to you. Complex Care Management services are voluntary, and the patient may decline or stop services at any time by request to their care team member.   Complex Care Management Consent Status: Patient agreed to services and verbal consent obtained.   Follow up plan:  Telephone appointment with complex care management team member scheduled for:  05/11/2024  Encounter Outcome:  Patient Scheduled  Thedford Franks, CMA [place]  [facility], [facility] Guide Direct Dial: [phone]  Fax: [phone] Website: [email or web address]

## 2024-04-28 ENCOUNTER — Ambulatory Visit: Payer: Self-pay | Admitting: Adult Health

## 2024-04-29 ENCOUNTER — Other Ambulatory Visit: Payer: Self-pay | Admitting: Family Medicine

## 2024-04-29 ENCOUNTER — Ambulatory Visit (HOSPITAL_COMMUNITY): Payer: Self-pay

## 2024-04-29 DIAGNOSIS — I10 Essential (primary) hypertension: Secondary | ICD-10-CM

## 2024-04-29 LAB — URINE CULTURE: Culture: <10000 — AB

## 2024-04-30 ENCOUNTER — Telehealth: Admitting: Emergency Medicine

## 2024-04-30 ENCOUNTER — Ambulatory Visit: Payer: Self-pay

## 2024-04-30 DIAGNOSIS — A09 Infectious gastroenteritis and colitis, unspecified: Secondary | ICD-10-CM

## 2024-04-30 NOTE — Patient Instructions (Signed)
  Miranda Wade, thank you for joining Miranda CHRISTELLA Belt, NP for today's virtual visit.  While this provider is not your primary care provider (PCP), if your PCP is located in our provider database this encounter information will be shared with them immediately following your visit.   A [facility] account gives you access to today's visit and all your visits, tests, and labs performed at [facility]  click here if you don't have a Miranda Wade account or go to [email or web address]  Consent: (Patient) Miranda Wade provided verbal consent for this virtual visit at the beginning of the encounter.  Current Medications:  Current Outpatient Medications:    amLODipine  (NORVASC ) 10 MG tablet, Take 1 tablet (10 mg total) by mouth daily., Disp: 90 tablet, Rfl: 3   Ascorbic Acid (VITAMIN C PO), Take 1 tablet by mouth daily., Disp: , Rfl:    atorvastatin  (LIPITOR) 80 MG tablet, Take 1 tablet by mouth once daily, Disp: 30 tablet, Rfl: 0   clobetasol  Wade (TEMOVATE ) 0.05 %, Apply 1 Application topically 2 (two) times daily., Disp: 30 g, Rfl: 11   diphenoxylate -atropine  (LOMOTIL ) 2.5-0.025 MG tablet, Take 1 tablet by mouth 4 (four) times daily as needed for diarrhea or loose stools., Disp: 20 tablet, Rfl: 0   ferrous sulfate 325 (65 FE) MG tablet, Take 325 mg by mouth daily with breakfast., Disp: , Rfl:    fluticasone  (FLONASE ) 50 MCG/ACT nasal spray, Place 2 sprays into both nostrils daily., Disp: 16 g, Rfl: 0   ibuprofen  (ADVIL ) 600 MG tablet, Take 600 mg by mouth every 6 (six) hours as needed for moderate pain., Disp: , Rfl:    losartan  (COZAAR ) 100 MG tablet, Take 1 tablet by mouth once daily, Disp: 30 tablet, Rfl: 0   Omega-3 Fatty Acids (FISH OIL PO), Take 1 capsule by mouth daily., Disp: , Rfl:    pantoprazole  (PROTONIX ) 40 MG tablet, Take 1 tablet (40 mg total) by mouth 2 (two) times daily before a meal., Disp: 60 tablet, Rfl: 3  Current Facility-Administered  Medications:    Miranda Wade, , Topical, TID, Miranda Wade, Miranda DEL, MD   Medications ordered in this encounter:  No orders of the defined types were placed in this encounter.    *If you need refills on other medications prior to your next appointment, please contact your pharmacy*  Follow-Up: Call back or seek an in-person evaluation if the symptoms worsen or if the condition fails to improve as anticipated.  [facility] [phone]  Other Instructions Focus on staying hydrated. Ok to use lomotil  as prescribed to help control diarrhea.   If results of stool testing abnormal and you need antibiotics for your diarrhea, urgent care will call you.    If you have been instructed to have an in-person evaluation today at a local Urgent Care facility, please use the link below. It will take you to a list of all of our available [facility], including address, phone number and hours of operation. Please do not delay care.  [facility]  If you or a family member do not have a primary care provider, use the link below to schedule a visit and establish care. When you choose a [facility] primary care physician or advanced practice provider, you gain a long-term partner in health. Find a Primary Care Provider  Learn more about [facility]'s in-office and virtual care options: [facility] - Get Care Now

## 2024-04-30 NOTE — Telephone Encounter (Signed)
 FYI Only or Action Required?: FYI only for provider.  Patient was last seen in primary care on 04/13/2024 by Cook, Jayce G, DO.  Called Nurse Triage reporting Diarrhea.  Symptoms began 6 days ago.  Interventions attempted: Prescription medications: rx fro UC and Immodium  and Other:  .  Symptoms are: unchanged.  Triage Disposition: See Physician Within 24 Hours  Patient/caregiver understands and will follow disposition?: yes         Copied from CRM [id number]. Topic: Clinical - Red Word Triage >> Apr 30, 2024  8:10 AM Marissa P wrote: Red Word that prompted transfer to Nurse Triage: Patient went to [place], this is the 6th day of her having diarrhea, stomach pain and nausea. She has been taking imodium, which hasn't helped. Went to urgent care Wednesday morning they prescribed her something which was not working neither. Would like an appt right away if possible. Reason for Disposition  [1] MODERATE diarrhea (e.g., 4-6 times / day more than normal) AND [2] present > 48 hours (2 days)  Answer Assessment - Initial Assessment Questions 1. DIARRHEA SEVERITY: How bad is the diarrhea? How many more stools have you had in the past 24 hours than normal?      6 times this am woke her up  2. ONSET: When did the diarrhea begin?      6 days  3. STOOL DESCRIPTION:  How loose or watery is the diarrhea? What is the stool color? Is there any blood or mucous in the stool?     Watery  4. VOMITING: Are you also vomiting? If Yes, ask: How many times in the past 24 hours?      Nausea  5. ABDOMEN PAIN: Are you having any abdomen pain? If Yes, ask: What does it feel like? (e.g., crampy, dull, intermittent, constant)      Crampy 4/10 comes and goes  6. ABDOMEN PAIN SEVERITY: If present, ask: How bad is the pain?  (e.g., Scale 1-10; mild, moderate, or severe)     4 7. ORAL INTAKE: If vomiting, Have you been able to drink liquids? How much liquids have you had in the past 24  hours?     Is drinking but has to have  8. HYDRATION: Any signs of dehydration? (e.g., dry mouth [not just dry lips], too weak to stand, dizziness, new weight loss) When did you last urinate?     Dry mouth - took a pill prescribed by UC for diarrhea and makes pt dizzy  9. EXPOSURE: Have you traveled to a foreign country recently? Have you been exposed to anyone with diarrhea? Could you have eaten any food that was spoiled?     [place] unknown /no/  10. ANTIBIOTIC USE: Are you taking antibiotics now or have you taken antibiotics in the past 2 months?       no 11. OTHER SYMPTOMS: Do you have any other symptoms? (e.g., fever, blood in stool)       nausea  Protocols used: Diarrhea-A-AH

## 2024-04-30 NOTE — Progress Notes (Signed)
 Virtual Visit Consent   Miranda Wade, you are scheduled for a virtual visit with a [facility] provider today. Just as with appointments in the office, your consent must be obtained to participate. Your consent will be active for this visit and any virtual visit you may have with one of our providers in the next 365 days. If you have a MyChart account, a copy of this consent can be sent to you electronically.  As this is a virtual visit, video technology does not allow for your provider to perform a traditional examination. This may limit your provider's ability to fully assess your condition. If your provider identifies any concerns that need to be evaluated in person or the need to arrange testing (such as labs, EKG, etc.), we will make arrangements to do so. Although advances in technology are sophisticated, we cannot ensure that it will always work on either your end or our end. If the connection with a video visit is poor, the visit may have to be switched to a telephone visit. With either a video or telephone visit, we are not always able to ensure that we have a secure connection.  By engaging in this virtual visit, you consent to the provision of healthcare and authorize for your insurance to be billed (if applicable) for the services provided during this visit. Depending on your insurance coverage, you may receive a charge related to this service.  I need to obtain your verbal consent now. Are you willing to proceed with your visit today? LITZI BINNING has provided verbal consent on 04/30/2024 for a virtual visit (video or telephone). [name], NP  Date: 04/30/2024 10:25 AM   Virtual Visit via Video Note   I, [name], connected with  PARNIKA TWETEN  ([id number], 11/29/1980) on 04/30/24 at  9:45 AM EDT by a video-enabled telemedicine application and verified that I am speaking with the correct person using two identifiers.  Location: Patient: Virtual Visit Location  Patient: Home Provider: Virtual Visit Location Provider: Home Office   I discussed the limitations of evaluation and management by telemedicine and the availability of in person appointments. The patient expressed understanding and agreed to proceed.    History of Present Illness: Miranda Wade is a 43 y.o. who identifies as a female who was assigned female at birth, and is being seen today for diarrhea.   Developed diarrhea 04/25/24 after trip to [place]. She is only one of 12 travelers to develop diarrhea. Was seen in urgent care 04/27/24, prescribed lomotil  which did help. Had only one diarrhea episode yesterday so stopped lomotil  because it makes her dizzy. She is not dizzy if she doesn't take lomotil . Today at 2:30 am diarrhea started back up and she has had diarrhea 6 times since.   Looking at [facility] notes, GI stool testing done but not yet resulted.    HPI: HPI  Problems:  Patient Active Problem List   Diagnosis Date Noted   Mass of upper inner quadrant of left breast 12/17/2023   Lichen sclerosus: reduce use to nightly from BID, recheck 6 months 12/17/2023   Localized swelling of chest wall 12/10/2023   Cyst, ovary, dermoid, right 12/01/2023   Lichen planus 11/24/2023   GERD (gastroesophageal reflux disease) 01/02/2023   LVH (left ventricular hypertrophy) 07/21/2017   Essential hypertension, benign 04/30/2016   Hyperlipidemia 04/30/2016    Allergies: No Known Allergies Medications:  Current Outpatient Medications:    amLODipine  (NORVASC ) 10 MG tablet, Take  1 tablet (10 mg total) by mouth daily., Disp: 90 tablet, Rfl: 3   Ascorbic Acid (VITAMIN C PO), Take 1 tablet by mouth daily., Disp: , Rfl:    atorvastatin  (LIPITOR) 80 MG tablet, Take 1 tablet by mouth once daily, Disp: 30 tablet, Rfl: 0   clobetasol  cream (TEMOVATE ) 0.05 %, Apply 1 Application topically 2 (two) times daily., Disp: 30 g, Rfl: 11   diphenoxylate -atropine  (LOMOTIL ) 2.5-0.025 MG tablet, Take 1 tablet by mouth 4  (four) times daily as needed for diarrhea or loose stools., Disp: 20 tablet, Rfl: 0   ferrous sulfate 325 (65 FE) MG tablet, Take 325 mg by mouth daily with breakfast., Disp: , Rfl:    fluticasone  (FLONASE ) 50 MCG/ACT nasal spray, Place 2 sprays into both nostrils daily., Disp: 16 g, Rfl: 0   ibuprofen  (ADVIL ) 600 MG tablet, Take 600 mg by mouth every 6 (six) hours as needed for moderate pain., Disp: , Rfl:    losartan  (COZAAR ) 100 MG tablet, Take 1 tablet by mouth once daily, Disp: 30 tablet, Rfl: 0   Omega-3 Fatty Acids (FISH OIL PO), Take 1 capsule by mouth daily., Disp: , Rfl:    pantoprazole  (PROTONIX ) 40 MG tablet, Take 1 tablet (40 mg total) by mouth 2 (two) times daily before a meal., Disp: 60 tablet, Rfl: 3  Current Facility-Administered Medications:    Gerhardt's butt cream, , Topical, TID, Eure, Vonn DEL, MD  Observations/Objective: Patient is well-developed, well-nourished in no acute distress.  Resting comfortably  at home.  Head is normocephalic, atraumatic.  No labored breathing.  Speech is clear and coherent with logical content.  Patient is alert and oriented at baseline.    Assessment and Plan: 1. Traveler's diarrhea (Primary)  Continue lomotil , wait for stool test resutls. Discussed diet for diarrhea and staying hydrated.   Follow Up Instructions: I discussed the assessment and treatment plan with the patient. The patient was provided an opportunity to ask questions and all were answered. The patient agreed with the plan and demonstrated an understanding of the instructions.  A copy of instructions were sent to the patient via MyChart unless otherwise noted below.   The patient was advised to call back or seek an in-person evaluation if the symptoms worsen or if the condition fails to improve as anticipated.    [name], NP

## 2024-05-04 ENCOUNTER — Ambulatory Visit: Admitting: Obstetrics & Gynecology

## 2024-05-04 VITALS — BP 147/81 | HR 83 | Ht 65.0 in | Wt 216.0 lb

## 2024-05-04 DIAGNOSIS — L900 Lichen sclerosus et atrophicus: Secondary | ICD-10-CM | POA: Diagnosis not present

## 2024-05-04 MED ORDER — CLOBETASOL PROPIONATE 0.05 % EX CREA: 1.0000 | TOPICAL_CREAM | Freq: Two times a day (BID) | CUTANEOUS | 11 refills | Status: DC

## 2024-05-04 NOTE — Progress Notes (Signed)
 Follow up appointment  Lichen sclerosus  Chief Complaint  Patient presents with   Follow-up    Blood pressure (!) 147/81, pulse 83, height 5' 5 (1.651 m), weight 216 lb (98 kg), last menstrual period 04/20/2024.  Patient is seen back in today for follow-up of her lichen sclerosus And problems with it for quite some time I saw her last fall and it took a couple months to figure out that it was not a severe yeast Bit unusual in a premenopausal woman but she responded great to the clobetasol  cream  She currently is using it nightly Her symptoms have completely resolved Even after going down to [place] and having 6-day case of traveler's diarrhea  On exam today he really cannot see any evidence of any lichen sclerosus at all Complete resolution of white plaques  MEDS ordered this encounter: Meds ordered this encounter  Medications   clobetasol  cream (TEMOVATE ) 0.05 %    Sig: Apply 1 Application topically 2 (two) times daily.    Dispense:  30 g    Refill:  11    Orders for this encounter: No orders of the defined types were placed in this encounter.   Impression + Management Plan   ICD-10-CM   1. Lichen sclerosus: Dx 09/2023: drop clobetasol  to every other night, ^prn  L90.0     Decrease use of clobetasol  to every other night for 10 increased utilization if needed depending on symptom complex I will see her back in 1 year for follow-up  She is not due for a Pap smear until November 2027  Follow Up: Return in about 1 year (around 05/04/2025) for Follow up, with Dr Jayne.     All questions were answered.  Past Medical History:  Diagnosis Date   Anemia    Essential hypertension    Hyperlipidemia    Seasonal allergies     Past Surgical History:  Procedure Laterality Date   BIOPSY  04/29/2022   Procedure: BIOPSY;  Surgeon: Cindie Carlin POUR, DO;  [facility];  Service: Endoscopy;;   BREAST BIOPSY Left 01/06/2024   path pending   BREAST BIOPSY Left  01/06/2024   US  LT BREAST BX W LOC DEV 1ST LESION IMG BX SPEC US  GUIDE 01/06/2024 Lennon Nest, MD [facility]   COLONOSCOPY WITH PROPOFOL  N/A 04/29/2022   Surgeon: Cindie Carlin POUR, DO;  nonbleeding internal hemorrhoids, otherwise normal exam.   ESOPHAGOGASTRODUODENOSCOPY (EGD) WITH PROPOFOL  N/A 04/29/2022   Surgeon: Cindie Carlin POUR, DO;  cm hiatal hernia, grade a reflux esophagitis, gastritis biopsied, single duodenal polyp resected and retrieved.  Pathology showed fragments of gastric mucosa with minimal vascular ectasia, no H. pylori.  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   KNEE ARTHROSCOPY WITH SUBCHONDROPLASTY Right 06/20/2023   Procedure: KNEE ARTHROSCOPY, PARTIAL MEDIAL MENISCECTOMY WITH MEDIAL TIBIA SUBCHONDROPLASTY;  Surgeon: Sharl Selinda Dover, MD;  Location: [facility];  Service: Orthopedics;  Laterality: Right;   LAPAROSCOPIC BILATERAL SALPINGECTOMY Bilateral 05/01/2022   Procedure: LAPAROSCOPIC BILATERAL SALPINGECTOMY;  Surgeon: Jayne Vonn DEL, MD;  Location: [facility];  Service: Gynecology;  Laterality: Bilateral;   MASS EXCISION Right 08/04/2017   Procedure: EXCISION OF RIGHT NASAL  MASS;  Surgeon: Karis Clunes, MD;  Location: [facility];  Service: ENT;  Laterality: Right;  LOCAL   POLYPECTOMY  04/29/2022   Procedure: POLYPECTOMY;  Surgeon: Cindie Carlin POUR, DO;  [facility];  Service: Endoscopy;;  REMOVAL OF NON VAGINAL CONTRACEPTIVE DEVICE N/A 05/01/2022   Procedure: REMOVAL OF NON VAGINAL CONTRACEPTIVE DEVICE;  Surgeon: Jayne Vonn DEL, MD;  Location: [facility];  Service: Gynecology;  Laterality: N/A;   WRIST SURGERY      OB History     Gravida  3   Para  2   Term  2   Preterm      AB  1   Living  2      SAB      IAB      Ectopic      Multiple      Live Births  1           No Known  Allergies  Social History   Socioeconomic History   Marital status: Married    Spouse name: Selinda   Number of children: 2   Years of education: 14   Highest education level: Some college, no degree  Occupational History   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  Tobacco Use   Smoking status: Former    Current packs/day: 0.50    Types: Cigarettes    Passive exposure: Current   Smokeless tobacco: Never  Vaping Use   Vaping status: Never Used  Substance and Sexual Activity   Alcohol use: No   Drug use: No   Sexual activity: Yes    Birth control/protection: Surgical    Comment: tubal  Other Topics Concern   Not on file  Social History Narrative   Lives daughter Verna Kand is in college, home some weekends   Currently separated from husband   Social Drivers of Health   Financial Resource Strain: Low Risk  (11/17/2023)   Overall Financial Resource Strain (CARDIA)    Difficulty of Paying Living Expenses: Not hard at all  Food Insecurity: No Food Insecurity (11/17/2023)   Hunger Vital Sign    Worried About Running Out of Food in the Last Year: Never true    Ran Out of Food in the Last Year: Never true  Transportation Needs: No Transportation Needs (11/17/2023)   PRAPARE - Administrator, Civil Service (Medical): No    Lack of Transportation (Non-Medical): No  Physical Activity: Insufficiently Active (11/17/2023)   Exercise Vital Sign    Days of Exercise per Week: 3 days    Minutes of Exercise per Session: 30 min  Stress: No Stress Concern Present (11/17/2023)   [employer] of Occupational Health - Occupational Stress Questionnaire    Feeling of Stress : Not at all  Social Connections: Moderately Integrated (11/17/2023)   Social Connection and Isolation Panel    Frequency of Communication with Friends and Family: More than three times a week    Frequency of Social Gatherings with Friends and Family: Once a week    Attends Religious Services: 1  to 4 times per year    Active Member of [employer] or Organizations: No    Attends Engineer, structural: Never    Marital Status: Married    Family History  Problem Relation Age of Onset   Stroke Maternal Grandmother    Hypertension Father    Heart disease Father 42       Valve replacement   Hyperlipidemia Father    Diabetes Mother    Hypertension Mother    Arthritis Mother    Hyperlipidemia Mother    Stroke Mother    Hypertension Brother    Asthma Daughter    Allergies  Daughter    Breast cancer Paternal Aunt

## 2024-05-06 NOTE — Progress Notes (Unsigned)
 Referring Provider: Cook, Jayce G, DO Primary Care Physician:  Cook, Jayce G, DO Primary GI Physician: Dr. Cindie  No chief complaint on file.   HPI:   Miranda Wade is a 43 y.o. female presenting today for routine follow-up of GERD, and diarrhea.   Diarrhea:  Patient previously experienced acute onset diarrhea in January 2025 with multiple loose bowel movements daily, mucus in her stool, 2 episodes of rectal bleeding.  Also noted mucus in her stool and 2 episodes of rectal bleeding.  Believes she may have had stool in her vagina as well prompting ER visit.  CT abdomen pelvis with contrast without evidence of obvious fistula.   Was seen by gynecology, diagnosed with candidal vulvovaginitis, treated with Diflucan  and the symptoms resolved. C. difficile testing negative.   Recently seen in urgent care for diarrhea on 04/27/24 after traveling to [place].  She was prescribed Lomotil  and GI panel was ordered.  This remains pending.   She had follow-up with her primary care doctor and reported improvement in diarrhea when taking Lomotil , but stopped Lomotil  as it made her dizzy and had recurrent diarrhea.  She was advised to continue Lomotil  and wait for stool testing to result.  Today,***   GERD: Previously uncontrolled on pantoprazole  40 mg daily.  Increase to twice daily in February 2025 ***   EGD 04/29/2022: 2 cm hiatal hernia, grade a reflux esophagitis, gastritis biopsied, single duodenal polyp resected and retrieved.  Pathology showed fragments of gastric mucosa with minimal vascular ectasia, no H. pylori.  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     Colonoscopy 04/29/2022 for rectal pain showing nonbleeding internal hemorrhoids, otherwise normal exam.  Past Medical History:  Diagnosis Date   Anemia    Essential hypertension    Hyperlipidemia    Seasonal allergies      Past Surgical History:  Procedure Laterality Date   BIOPSY  04/29/2022   Procedure: BIOPSY;  Surgeon: Cindie Carlin POUR, DO;  [facility];  Service: Endoscopy;;   BREAST BIOPSY Left 01/06/2024   path pending   BREAST BIOPSY Left 01/06/2024   US  LT BREAST BX W LOC DEV 1ST LESION IMG BX SPEC US  GUIDE 01/06/2024 Lennon Nest, MD [facility]   COLONOSCOPY WITH PROPOFOL  N/A 04/29/2022   Surgeon: Cindie Carlin POUR, DO;  nonbleeding internal hemorrhoids, otherwise normal exam.   ESOPHAGOGASTRODUODENOSCOPY (EGD) WITH PROPOFOL  N/A 04/29/2022   Surgeon: Cindie Carlin POUR, DO;  cm hiatal hernia, grade a reflux esophagitis, gastritis biopsied, single duodenal polyp resected and retrieved.  Pathology showed fragments of gastric mucosa with minimal vascular ectasia, no H. pylori.  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   KNEE ARTHROSCOPY WITH SUBCHONDROPLASTY Right 06/20/2023   Procedure: KNEE ARTHROSCOPY, PARTIAL MEDIAL MENISCECTOMY WITH MEDIAL TIBIA SUBCHONDROPLASTY;  Surgeon: Sharl Selinda Dover, MD;  Location: [facility];  Service: Orthopedics;  Laterality: Right;   LAPAROSCOPIC BILATERAL SALPINGECTOMY Bilateral 05/01/2022   Procedure: LAPAROSCOPIC BILATERAL SALPINGECTOMY;  Surgeon: Jayne Vonn DEL, MD;  Location: [facility];  Service: Gynecology;  Laterality: Bilateral;   MASS EXCISION Right 08/04/2017   Procedure: EXCISION OF RIGHT NASAL  MASS;  Surgeon: Karis Clunes, MD;  [facility];  Service: ENT;  Laterality: Right;  LOCAL   POLYPECTOMY  04/29/2022   Procedure: POLYPECTOMY;  Surgeon: Cindie Carlin POUR, DO;  [facility];  Service: Endoscopy;;   REMOVAL OF NON VAGINAL CONTRACEPTIVE DEVICE N/A 05/01/2022   Procedure: REMOVAL OF NON VAGINAL CONTRACEPTIVE DEVICE;  Surgeon: Jayne Vonn DEL, MD;  [facility];  Service: Gynecology;   Laterality: N/A;   WRIST SURGERY      Current Outpatient Medications  Medication Sig Dispense Refill   amLODipine  (NORVASC ) 10 MG tablet Take 1 tablet (10 mg total) by mouth daily. 90 tablet 3   Ascorbic Acid (VITAMIN C PO) Take 1 tablet by mouth daily.     atorvastatin  (LIPITOR) 80 MG tablet Take 1 tablet by mouth once daily 30 tablet 0   clobetasol  cream (TEMOVATE ) 0.05 % Apply 1 Application topically 2 (two) times daily. 30 g 11   diphenoxylate -atropine  (LOMOTIL ) 2.5-0.025 MG tablet Take 1 tablet by mouth 4 (four) times daily as needed for diarrhea or loose stools. 20 tablet 0   ferrous sulfate 325 (65 FE) MG tablet Take 325 mg by mouth daily with breakfast.     fluticasone  (FLONASE ) 50 MCG/ACT nasal spray Place 2 sprays into both nostrils daily. 16 g 0   ibuprofen  (ADVIL ) 600 MG tablet Take 600 mg by mouth every 6 (six) hours as needed for moderate pain.     losartan  (COZAAR ) 100 MG tablet Take 1 tablet by mouth once daily 30 tablet 0   Omega-3 Fatty Acids (FISH OIL PO) Take 1 capsule by mouth daily.     pantoprazole  (PROTONIX ) 40 MG tablet Take 1 tablet (40 mg total) by mouth 2 (two) times daily before a meal. 60 tablet 3   Current Facility-Administered Medications  Medication Dose Route Frequency Provider Last Rate Last Admin   Gerhardt's butt cream   Topical TID Jayne Vonn DEL, MD        Allergies as of 05/07/2024   (No Known Allergies)    Family History  Problem Relation Age of Onset   Stroke Maternal Grandmother    Hypertension Father    Heart disease Father 71       Valve replacement   Hyperlipidemia Father    Diabetes Mother    Hypertension Mother    Arthritis Mother    Hyperlipidemia Mother    Stroke Mother    Hypertension Brother    Asthma Daughter    Allergies Daughter    Breast cancer Paternal Aunt     Social History   Socioeconomic History   Marital status: Married    Spouse name: Selinda   Number of children: 2   Years of education: 14   Highest  education level: Some college, no degree  Occupational History   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  Tobacco Use   Smoking status: Former    Current packs/day: 0.50    Types: Cigarettes    Passive exposure: Current   Smokeless tobacco: Never  Vaping Use   Vaping status: Never Used  Substance and Sexual Activity   Alcohol use: No   Drug use: No   Sexual activity: Yes    Birth control/protection: Surgical    Comment: tubal  Other Topics Concern   Not on file  Social History Narrative   Lives daughter Jayonna   Shitashia is in college, home some weekends   Currently separated from husband   Social Drivers of Health   Financial Resource Strain: Low Risk  (11/17/2023)   Overall Financial Resource Strain (CARDIA)    Difficulty of Paying Living Expenses: Not hard at all  Food Insecurity: No Food  Insecurity (11/17/2023)   Hunger Vital Sign    Worried About Running Out of Food in the Last Year: Never true    Ran Out of Food in the Last Year: Never true  Transportation Needs: No Transportation Needs (11/17/2023)   PRAPARE - Administrator, Civil Service (Medical): No    Lack of Transportation (Non-Medical): No  Physical Activity: Insufficiently Active (11/17/2023)   Exercise Vital Sign    Days of Exercise per Week: 3 days    Minutes of Exercise per Session: 30 min  Stress: No Stress Concern Present (11/17/2023)   [employer] of Occupational Health - Occupational Stress Questionnaire    Feeling of Stress : Not at all  Social Connections: Moderately Integrated (11/17/2023)   Social Connection and Isolation Panel    Frequency of Communication with Friends and Family: More than three times a week    Frequency of Social Gatherings with Friends and Family: Once a week    Attends Religious Services: 1 to 4 times per year    Active Member of [employer] or Organizations: No    Attends Banker Meetings: Never    Marital Status: Married    Review of  Systems: Gen: Denies fever, chills, anorexia. Denies fatigue, weakness, weight loss.  CV: Denies chest pain, palpitations, syncope, peripheral edema, and claudication. Resp: Denies dyspnea at rest, cough, wheezing, coughing up blood, and pleurisy. GI: Denies vomiting blood, jaundice, and fecal incontinence.   Denies dysphagia or odynophagia. Derm: Denies rash, itching, dry skin Psych: Denies depression, anxiety, memory loss, confusion. No homicidal or suicidal ideation.  Heme: Denies bruising, bleeding, and enlarged lymph nodes.  Physical Exam: LMP 04/20/2024 (Exact Date)  General:   Alert and oriented. No distress noted. Pleasant and cooperative.  Head:  Normocephalic and atraumatic. Eyes:  Conjuctiva clear without scleral icterus. Heart:  S1, S2 present without murmurs appreciated. Lungs:  Clear to auscultation bilaterally. No wheezes, rales, or rhonchi. No distress.  Abdomen:  +BS, soft, non-tender and non-distended. No rebound or guarding. No HSM or masses noted. Msk:  Symmetrical without gross deformities. Normal posture. Extremities:  Without edema. Neurologic:  Alert and  oriented x4 Psych:  Normal mood and affect.    Assessment:     Plan:  ***   Josette Centers, PA-C [facility] 05/07/2024

## 2024-05-07 ENCOUNTER — Ambulatory Visit: Admitting: Gastroenterology

## 2024-05-07 ENCOUNTER — Encounter: Payer: Self-pay | Admitting: Gastroenterology

## 2024-05-07 VITALS — BP 138/88 | HR 78 | Temp 98.4°F | Ht 65.0 in | Wt 214.8 lb

## 2024-05-07 DIAGNOSIS — R197 Diarrhea, unspecified: Secondary | ICD-10-CM

## 2024-05-07 DIAGNOSIS — K219 Gastro-esophageal reflux disease without esophagitis: Secondary | ICD-10-CM

## 2024-05-07 NOTE — Patient Instructions (Signed)
 Lets try decreasing pantoprazole  to 40 mg once daily, 30 minutes before breakfast.  If you have recurrent heartburn symptoms 2-3 times a week on a regular basis, you may go ahead and resume pantoprazole  twice daily.  Follow a GERD diet:  Avoid fried, fatty, greasy, spicy, citrus foods. Avoid caffeine  and carbonated beverages. Avoid chocolate. Try eating 4-6 small meals a day rather than 3 large meals. Do not eat within 3 hours of laying down. Prop head of bed up on wood or bricks to create a 6 inch incline.   We will follow-up in 6 months or sooner if needed.   Josette Centers, PA-C [facility]

## 2024-05-11 ENCOUNTER — Encounter: Payer: Self-pay | Admitting: *Deleted

## 2024-05-11 ENCOUNTER — Other Ambulatory Visit: Payer: Self-pay | Admitting: *Deleted

## 2024-05-11 ENCOUNTER — Other Ambulatory Visit: Payer: Self-pay

## 2024-05-11 NOTE — Patient Instructions (Signed)
 Visit Information  Miranda Wade was given information about Medicaid Managed Care team care coordination services as a part of their Healthy [facility] Medicaid benefit. Miranda Wade verbally consented to engagement with the [facility] Managed Care team.   If you are experiencing a medical emergency, please call 911 or report to your local emergency department or urgent care.   If you have a non-emergency medical problem during routine business hours, please contact your provider's office and ask to speak with a nurse.   For questions related to your Healthy [facility] health plan, please call: [phone] or visit the homepage here: [email or web address]  If you would like to schedule transportation through your Healthy [facility] plan, please call the following number at least 2 days in advance of your appointment: [phone]  For information about your ride after you set it up, call Ride Assist at [phone]. Use this number to activate a Will Call pickup, or if your transportation is late for a scheduled pickup. Use this number, too, if you need to make a change or cancel a previously scheduled reservation.  If you need transportation services right away, call [phone]. The after-hours call center is staffed 24 hours to handle ride assistance and urgent reservation requests (including discharges) 365 days a year. Urgent trips include sick visits, hospital discharge requests and life-sustaining treatment.  Call the [facility] Line at [phone], at any time, 24 hours a day, 7 days a week. If you are in danger or need immediate medical attention call 911.  If you would like help to quit smoking, call 1-800-QUIT-NOW (705-469-1060) OR Espaol: 1-855-Djelo-Ya (8-144-664-6430) o para ms informacin haga clic aqu or Text READY to 799-599 to register via text  Miranda Wade - following are the goals we discussed in your visit today:   Goals  Addressed   None     Please see education materials related to Hypertension provided by MyChart link.  Patient verbalizes understanding of instructions and care plan provided today and agrees to view in MyChart. Active MyChart status and patient understanding of how to access instructions and care plan via MyChart confirmed with patient.     No further follow up required: Patient declined continued outreach   Miranda Wade, BSN RN [facility], [facility] RN Care Manager Direct Dial: [phone]  Fax: [phone]   Following is a copy of your plan of care:  There are no care plans that you recently modified to display for this patient.

## 2024-05-11 NOTE — Patient Outreach (Signed)
 Complex Care Management   Visit Note  05/11/2024  Name:  Miranda Wade MRN: [id number] DOB: 12-31-1980  Situation: Referral received for Complex Care Management related to HTN I obtained verbal consent from Patient.  Visit completed with patient  on the phone  Background:   Past Medical History:  Diagnosis Date   Anemia    Essential hypertension    Hyperlipidemia    Seasonal allergies     Assessment: Patient Reported Symptoms:  Cognitive Cognitive Status: No symptoms reported Cognitive/Intellectual Conditions Management [RPT]: None reported or documented in medical history or problem list   Health Maintenance Behaviors: Annual physical exam Healing Pattern: Average Health Facilitated by: Rest  Neurological Neurological Review of Symptoms: No symptoms reported Neurological Management Strategies: Routine screening Neurological Self-Management Outcome: 4 (good)  HEENT HEENT Symptoms Reported: No symptoms reported HEENT Management Strategies: Routine screening HEENT Self-Management Outcome: 4 (good)    Cardiovascular Cardiovascular Symptoms Reported: No symptoms reported Does patient have uncontrolled Hypertension?: Yes Is patient checking Blood Pressure at home?: Yes Patient's Recent BP reading at home: 127/81 Cardiovascular Management Strategies: Routine screening, Medication therapy Cardiovascular Self-Management Outcome: 4 (good)  Respiratory Respiratory Symptoms Reported: No symptoms reported Respiratory Management Strategies: Routine screening Respiratory Self-Management Outcome: 4 (good)  Endocrine Endocrine Symptoms Reported: No symptoms reported Is patient diabetic?: No Endocrine Self-Management Outcome: 4 (good)  Gastrointestinal Gastrointestinal Symptoms Reported: No symptoms reported Gastrointestinal Self-Management Outcome: 4 (good)    Genitourinary Genitourinary Symptoms Reported: No symptoms reported Genitourinary Self-Management Outcome: 4 (good)   Integumentary Integumentary Symptoms Reported: No symptoms reported Skin Self-Management Outcome: 4 (good)  Musculoskeletal Musculoskelatal Symptoms Reviewed: No symptoms reported Musculoskeletal Self-Management Outcome: 4 (good) Falls in the past year?: No Number of falls in past year: 1 or less Was there an injury with Fall?: No Fall Risk Category Calculator: 0 Patient Fall Risk Level: Low Fall Risk Fall risk Follow up: Falls evaluation completed  Psychosocial Psychosocial Symptoms Reported: No symptoms reported Behavioral Health Self-Management Outcome: 4 (good) Major Change/Loss/Stressor/Fears (CP): Denies Techniques to Cope with Loss/Stress/Change: Not applicable Quality of Family Relationships: helpful, involved, supportive Do you feel physically threatened by others?: No      05/11/2024    9:58 AM  Depression screen PHQ 2/9  Decreased Interest 0  Down, Depressed, Hopeless 0  PHQ - 2 Score 0    Vitals:   05/11/24 1002  BP: 127/81    Medications Reviewed Today     Reviewed by Bertrum Rosina HERO, RN (Registered Nurse) on 05/11/24 at [phone]  Med List Status: <None>   Medication Order Taking? Sig Documenting Provider Last Dose Status Informant  amLODipine  (NORVASC ) 10 MG tablet [id number] Yes Take 1 tablet (10 mg total) by mouth daily. Cook, Jayce G, DO  Active   Ascorbic Acid (VITAMIN C PO) [id number] Yes Take 1 tablet by mouth daily. [provider]  Active Self  atorvastatin  (LIPITOR) 80 MG tablet [id number] Yes Take 1 tablet by mouth once daily Cook, Jayce G, DO  Active   clobetasol  cream (TEMOVATE ) 0.05 % [id number] Yes Apply 1 Application topically 2 (two) times daily. Jayne Vonn DEL, MD  Active   diphenoxylate -atropine  (LOMOTIL ) 2.5-0.025 MG tablet [id number]  Take 1 tablet by mouth 4 (four) times daily as needed for diarrhea or loose stools.  Patient not taking: Reported on 05/11/2024   Gilmer Etta PARAS, NP  Active   ferrous sulfate 325 (65 FE) MG tablet  [id number] Yes Take 325 mg by mouth daily with breakfast. [provider]  Active Self  fluticasone  (FLONASE ) 50 MCG/ACT nasal spray [id number] Yes Place 2 sprays into both nostrils daily.  Patient taking differently: Place 2 sprays into both nostrils daily as needed.   Leath-Warren, Etta PARAS, NP  Active            Med Note GARNET ROSINA HERO   Tue May 11, 2024  9:54 AM)    Gerhardt's butt cream [id number]   Jayne Vonn DEL, MD  Active   ibuprofen  (ADVIL ) 600 MG tablet [id number] Yes Take 600 mg by mouth every 6 (six) hours as needed for moderate pain. [provider]  Active Self  losartan  (COZAAR ) 100 MG tablet [id number] Yes Take 1 tablet by mouth once daily Cook, Jayce G, DO  Active   Omega-3 Fatty Acids (FISH OIL PO) [id number] Yes Take 1 capsule by mouth daily. [provider]  Active Self  pantoprazole  (PROTONIX ) 40 MG tablet [id number] Yes Take 1 tablet (40 mg total) by mouth 2 (two) times daily before a meal.  Patient taking differently: Take 40 mg by mouth daily.   Rudy Josette RAMAN, PA-C  Active             Recommendation:   Continue Current Plan of Care  Follow Up Plan:   Patient has met all care management goals. Care Management case will be closed. Patient has been provided contact information should new needs arise.   ROSINA Forte, BSN RN [facility], [facility] RN Care Manager Direct Dial: [phone]  Fax: [phone]

## 2024-05-24 ENCOUNTER — Ambulatory Visit: Payer: No Typology Code available for payment source | Admitting: Family Medicine

## 2024-05-26 ENCOUNTER — Other Ambulatory Visit: Payer: Self-pay | Admitting: Family Medicine

## 2024-05-26 DIAGNOSIS — I10 Essential (primary) hypertension: Secondary | ICD-10-CM

## 2024-06-23 ENCOUNTER — Encounter (HOSPITAL_COMMUNITY): Payer: Self-pay | Admitting: Emergency Medicine

## 2024-06-23 ENCOUNTER — Other Ambulatory Visit: Payer: Self-pay

## 2024-06-23 ENCOUNTER — Emergency Department (HOSPITAL_COMMUNITY)
Admission: EM | Admit: 2024-06-23 | Discharge: 2024-06-23 | Discharge status: Against Medical Advice | Attending: Emergency Medicine | Admitting: Emergency Medicine

## 2024-06-23 ENCOUNTER — Other Ambulatory Visit: Payer: Self-pay | Admitting: Family Medicine

## 2024-06-23 DIAGNOSIS — Z5321 Procedure and treatment not carried out due to patient leaving prior to being seen by health care provider: Secondary | ICD-10-CM | POA: Insufficient documentation

## 2024-06-23 DIAGNOSIS — I10 Essential (primary) hypertension: Secondary | ICD-10-CM

## 2024-06-23 DIAGNOSIS — M25521 Pain in right elbow: Secondary | ICD-10-CM | POA: Diagnosis not present

## 2024-06-23 NOTE — ED Triage Notes (Signed)
 Pt reports onset of right elbow pain that is traveling up her arm about an hour ago.

## 2024-07-13 ENCOUNTER — Ambulatory Visit

## 2024-07-15 ENCOUNTER — Ambulatory Visit (INDEPENDENT_AMBULATORY_CARE_PROVIDER_SITE_OTHER): Admitting: Family Medicine

## 2024-07-15 VITALS — BP 138/91 | HR 72 | Temp 98.2°F | Ht 65.0 in | Wt 215.0 lb

## 2024-07-15 DIAGNOSIS — E782 Mixed hyperlipidemia: Secondary | ICD-10-CM

## 2024-07-15 DIAGNOSIS — K219 Gastro-esophageal reflux disease without esophagitis: Secondary | ICD-10-CM

## 2024-07-15 DIAGNOSIS — I10 Essential (primary) hypertension: Secondary | ICD-10-CM | POA: Diagnosis not present

## 2024-07-15 MED ORDER — PANTOPRAZOLE SODIUM 40 MG PO TBEC: 40.0000 mg | DELAYED_RELEASE_TABLET | Freq: Every day | ORAL | 1 refills | Status: AC | PRN

## 2024-07-15 NOTE — Assessment & Plan Note (Signed)
 GERD stable on Protonix .

## 2024-07-15 NOTE — Assessment & Plan Note (Addendum)
 LDL not at goal.  Reassessing today.  Continue statin.  May need addition of Zetia and/or PCSK9. The 10-year ASCVD risk score (Arnett DK, et al., 2019) is: 5.3%   Values used to calculate the score:     Age: 43 years     Clincally relevant sex: Female     Is Non-Hispanic African American: Yes     Diabetic: No     Tobacco smoker: No     Systolic Blood Pressure: 138 mmHg     Is BP treated: Yes     HDL Cholesterol: 40 mg/dL     Total Cholesterol: 195 mg/dL

## 2024-07-15 NOTE — Progress Notes (Signed)
 Subjective:  Patient ID: Miranda Wade, female    DOB: 05-Aug-1981  Age: 43 y.o. MRN: [id number]  CC:   Chief Complaint  Patient presents with   3 month follow up    HTN , lipids Had biopsy of breast and has lost weight    HPI:  43 year old female presents for follow-up.  Patient's BP has improved but is still slightly elevated.  She is compliant with Norvasc  and losartan .  She is feeling well.  No chest pain or shortness of breath.  Needs lipid panel today.  Last LDL was 143.  She is on atorvastatin  80 mg daily.  Patient Active Problem List   Diagnosis Date Noted   Lichen sclerosus: reduce use to nightly from BID, recheck 6 months 12/17/2023   Cyst, ovary, dermoid, right 12/01/2023   Lichen planus 11/24/2023   GERD (gastroesophageal reflux disease) 01/02/2023   LVH (left ventricular hypertrophy) 07/21/2017   Essential hypertension, benign 04/30/2016   Hyperlipidemia 04/30/2016    Social Hx   Social History   Socioeconomic History   Marital status: Married    Spouse name: Selinda   Number of children: 2   Years of education: 14   Highest education level: Some college, no degree  Occupational History   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  Tobacco Use   Smoking status: Former    Current packs/day: 0.50    Types: Cigarettes    Passive exposure: Current   Smokeless tobacco: Never  Vaping Use   Vaping status: Never Used  Substance and Sexual Activity   Alcohol use: No   Drug use: No   Sexual activity: Yes    Birth control/protection: Surgical    Comment: tubal  Other Topics Concern   Not on file  Social History Narrative   Lives daughter Verna Kand is in college, home some weekends   Currently separated from husband   Social Drivers of Health   Financial Resource Strain: Low Risk  (07/12/2024)   Overall Financial Resource Strain (CARDIA)    Difficulty of Paying Living Expenses: Not hard at all  Food Insecurity: No Food Insecurity  (07/12/2024)   Hunger Vital Sign    Worried About Running Out of Food in the Last Year: Never true    Ran Out of Food in the Last Year: Never true  Transportation Needs: No Transportation Needs (07/12/2024)   PRAPARE - Administrator, Civil Service (Medical): No    Lack of Transportation (Non-Medical): No  Physical Activity: Insufficiently Active (07/12/2024)   Exercise Vital Sign    Days of Exercise per Week: 4 days    Minutes of Exercise per Session: 30 min  Stress: No Stress Concern Present (07/12/2024)   [employer] of Occupational Health - Occupational Stress Questionnaire    Feeling of Stress: Not at all  Social Connections: Moderately Integrated (07/12/2024)   Social Connection and Isolation Panel    Frequency of Communication with Friends and Family: More than three times a week    Frequency of Social Gatherings with Friends and Family: Twice a week    Attends Religious Services: 1 to 4 times per year    Active Member of [employer] or Organizations: No    Attends Engineer, structural: Not on file    Marital Status: Married    Review of Systems Per HPI  Objective:  BP (!) 138/91   Pulse 72   Temp 98.2 F (36.8 C)  Ht 5' 5 (1.651 m)   Wt 215 lb (97.5 kg)   SpO2 99%   BMI 35.78 kg/m      07/15/2024    8:41 AM 07/15/2024    8:24 AM 06/23/2024   11:00 PM  BP/Weight  Systolic BP 138 142 156  Diastolic BP 91 88 94  Wt. (Lbs)  215   BMI  35.78 kg/m2     Physical Exam Vitals and nursing note reviewed.  Constitutional:      General: She is not in acute distress.    Appearance: Normal appearance.  HENT:     Head: Normocephalic and atraumatic.  Eyes:     General:        Right eye: No discharge.        Left eye: No discharge.     Conjunctiva/sclera: Conjunctivae normal.  Cardiovascular:     Rate and Rhythm: Normal rate and regular rhythm.  Pulmonary:     Effort: Pulmonary effort is normal.     Breath sounds: Normal breath sounds. No  wheezing, rhonchi or rales.  Neurological:     Mental Status: She is alert.  Psychiatric:        Mood and Affect: Mood normal.        Behavior: Behavior normal.     Lab Results  Component Value Date   WBC 8.9 11/27/2023   HGB 11.5 (L) 11/27/2023   HCT 34.2 (L) 11/27/2023   PLT 310 11/27/2023   GLUCOSE 100 (H) 12/08/2023   CHOL 195 11/24/2023   TRIG 66 11/24/2023   HDL 40 11/24/2023   LDLCALC 143 (H) 11/24/2023   ALT 14 11/24/2023   AST 13 11/24/2023   NA 140 12/08/2023   K 4.0 12/08/2023   CL 103 12/08/2023   CREATININE 0.70 12/08/2023   BUN 10 12/08/2023   CO2 22 12/08/2023   TSH 1.430 11/28/2023   HGBA1C 5.7 (H) 04/17/2023     Assessment & Plan:  Essential hypertension, benign Assessment & Plan: BP mildly elevated here today.  Continue Norvasc  and losartan .  I am not making any medication changes at this time.  Discussed lifestyle changes.  Follow-up in 6 months.  Advised to monitor BP closely at home.   Mixed hyperlipidemia Assessment & Plan: LDL not at goal.  Reassessing today.  Continue statin.  May need addition of Zetia and/or PCSK9. The 10-year ASCVD risk score (Arnett DK, et al., 2019) is: 5.3%   Values used to calculate the score:     Age: 51 years     Clincally relevant sex: Female     Is Non-Hispanic African American: Yes     Diabetic: No     Tobacco smoker: No     Systolic Blood Pressure: 138 mmHg     Is BP treated: Yes     HDL Cholesterol: 40 mg/dL     Total Cholesterol: 195 mg/dL   Orders: -     Lipid panel  Gastroesophageal reflux disease, unspecified whether esophagitis present Assessment & Plan: GERD stable on Protonix .  Orders: -     Pantoprazole  Sodium; Take 1 tablet (40 mg total) by mouth daily as needed.  Dispense: 90 tablet; Refill: 1    Follow-up:  6 months  Trevino Wyatt Bluford DO [facility]

## 2024-07-15 NOTE — Assessment & Plan Note (Signed)
 BP mildly elevated here today.  Continue Norvasc  and losartan .  I am not making any medication changes at this time.  Discussed lifestyle changes.  Follow-up in 6 months.  Advised to monitor BP closely at home.

## 2024-07-15 NOTE — Patient Instructions (Signed)
 Watch your BP closely.  Healthy diet and exercise.  Follow up in 6 months.

## 2024-07-16 LAB — QUANTIFERON-TB GOLD PLUS
QuantiFERON Mitogen Value: >10 [IU]/mL
QuantiFERON Nil Value: 0.02 [IU]/mL
QuantiFERON TB1 Ag Value: 0.08 [IU]/mL
QuantiFERON TB2 Ag Value: 0.05 [IU]/mL

## 2024-07-16 LAB — LIPID PANEL
Chol/HDL Ratio: 3.8 ratio (ref 0.0–4.4)
Cholesterol, Total: 186 mg/dL (ref 100–199)
HDL: 49 mg/dL (ref >39)
LDL Chol Calc (NIH): 128 mg/dL — ABNORMAL HIGH (ref 0–99)
Triglycerides: 44 mg/dL (ref 0–149)
VLDL Cholesterol Cal: 9 mg/dL (ref 5–40)

## 2024-07-18 ENCOUNTER — Ambulatory Visit: Payer: Self-pay | Admitting: Family Medicine

## 2024-07-26 ENCOUNTER — Other Ambulatory Visit: Payer: Self-pay | Admitting: Gastroenterology

## 2024-07-26 DIAGNOSIS — K219 Gastro-esophageal reflux disease without esophagitis: Secondary | ICD-10-CM

## 2024-08-03 ENCOUNTER — Other Ambulatory Visit (HOSPITAL_COMMUNITY): Payer: Self-pay | Admitting: Family Medicine

## 2024-08-03 DIAGNOSIS — Z1231 Encounter for screening mammogram for malignant neoplasm of breast: Secondary | ICD-10-CM

## 2024-08-05 ENCOUNTER — Ambulatory Visit (HOSPITAL_COMMUNITY)
Admission: RE | Admit: 2024-08-05 | Discharge: 2024-08-05 | Disposition: A | Discharge status: Home / Self Care | Source: Ambulatory Visit | Attending: Family Medicine | Admitting: Family Medicine

## 2024-08-05 DIAGNOSIS — Z1231 Encounter for screening mammogram for malignant neoplasm of breast: Secondary | ICD-10-CM | POA: Diagnosis not present

## 2024-08-24 ENCOUNTER — Encounter: Payer: Self-pay | Admitting: Internal Medicine

## 2024-08-24 ENCOUNTER — Ambulatory Visit: Attending: Internal Medicine | Admitting: Internal Medicine

## 2024-08-24 VITALS — BP 132/80 | HR 68 | Ht 66.0 in | Wt 226.0 lb

## 2024-08-24 DIAGNOSIS — I517 Cardiomegaly: Secondary | ICD-10-CM | POA: Diagnosis not present

## 2024-08-24 DIAGNOSIS — I10 Essential (primary) hypertension: Secondary | ICD-10-CM

## 2024-08-24 DIAGNOSIS — R011 Cardiac murmur, unspecified: Secondary | ICD-10-CM | POA: Insufficient documentation

## 2024-08-24 DIAGNOSIS — E782 Mixed hyperlipidemia: Secondary | ICD-10-CM

## 2024-08-24 NOTE — Patient Instructions (Signed)
 Medication Instructions:  Your physician recommends that you continue on your current medications as directed. Please refer to the Current Medication list given to you today.   Labwork: None  Testing/Procedures: Your physician has requested that you have an echocardiogram. Echocardiography is a painless test that uses sound waves to create images of your heart. It provides your doctor with information about the size and shape of your heart and how well your heart's chambers and valves are working. This procedure takes approximately one hour. There are no restrictions for this procedure. Please do NOT wear cologne, perfume, aftershave, or lotions (deodorant is allowed). Please arrive 15 minutes prior to your appointment time.  Please note: We ask at that you not bring children with you during ultrasound (echo/ vascular) testing. Due to room size and safety concerns, children are not allowed in the ultrasound rooms during exams. Our front office staff cannot provide observation of children in our lobby area while testing is being conducted. An adult accompanying a patient to their appointment will only be allowed in the ultrasound room at the discretion of the ultrasound technician under special circumstances. We apologize for any inconvenience.   Follow-Up: Your physician recommends that you schedule a follow-up appointment in: Pending Resuts  Any Other Special Instructions Will Be Listed Below (If Applicable). Thank you for choosing [facility]!     If you need a refill on your cardiac medications before your next appointment, please call your pharmacy.

## 2024-08-24 NOTE — Progress Notes (Signed)
 Cardiology Office Note  Date: 08/24/2024   ID: ARCOLA FRESHOUR, DOB 1981/01/29, MRN [id number]  PCP:  Bluford Jacqulyn MATSU, DO  Cardiologist:  Jayson Sierras, MD Electrophysiologist:  None   History of Present Illness: Miranda Wade is a 43 y.o. female known to have HTN, HLD is here to establish care.  She was told she had a loud murmur on exam at the [facility].  She is here to get a repeat echocardiogram.  I reviewed her prior notes and echocardiogram.  Does not have any symptoms.  Occasionally has palpitations at night.  No angina or DOE.  Doing well otherwise.  No dizziness, syncope.    Past Medical History:  Diagnosis Date   Anemia    Essential hypertension    Hyperlipidemia    Seasonal allergies     Past Surgical History:  Procedure Laterality Date   BIOPSY  04/29/2022   Procedure: BIOPSY;  Surgeon: Cindie Carlin POUR, DO;  [facility];  Service: Endoscopy;;   BREAST BIOPSY Left 01/06/2024   path pending   BREAST BIOPSY Left 01/06/2024   US  LT BREAST BX W LOC DEV 1ST LESION IMG BX SPEC US  GUIDE 01/06/2024 Lennon Nest, MD [facility]-ULTRAS[facility]   COLONOSCOPY WITH PROPOFOL  N/A 04/29/2022   Surgeon: Cindie Carlin POUR, DO;  nonbleeding internal hemorrhoids, otherwise normal exam.   ESOPHAGOGASTRODUODENOSCOPY (EGD) WITH PROPOFOL  N/A 04/29/2022   Surgeon: Cindie Carlin POUR, DO;  cm hiatal hernia, grade a reflux esophagitis, gastritis biopsied, single duodenal polyp resected and retrieved.  Pathology showed fragments of gastric mucosa with minimal vascular ectasia, no H. pylori.  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   KNEE ARTHROSCOPY WITH SUBCHONDROPLASTY Right 06/20/2023   Procedure: KNEE ARTHROSCOPY, PARTIAL MEDIAL MENISCECTOMY WITH MEDIAL TIBIA SUBCHONDROPLASTY;  Surgeon: Sharl Selinda Dover, MD;  Location: [facility];  Service: Orthopedics;   Laterality: Right;   LAPAROSCOPIC BILATERAL SALPINGECTOMY Bilateral 05/01/2022   Procedure: LAPAROSCOPIC BILATERAL SALPINGECTOMY;  Surgeon: Jayne Vonn DEL, MD;  Location: [facility];  Service: Gynecology;  Laterality: Bilateral;   MASS EXCISION Right 08/04/2017   Procedure: EXCISION OF RIGHT NASAL  MASS;  Surgeon: Karis Clunes, MD;  Location: [facility];  Service: ENT;  Laterality: Right;  LOCAL   POLYPECTOMY  04/29/2022   Procedure: POLYPECTOMY;  Surgeon: Cindie Carlin POUR, DO;  [facility];  Service: Endoscopy;;   REMOVAL OF NON VAGINAL CONTRACEPTIVE DEVICE N/A 05/01/2022   Procedure: REMOVAL OF NON VAGINAL CONTRACEPTIVE DEVICE;  Surgeon: Jayne Vonn DEL, MD;  Location: [facility];  Service: Gynecology;  Laterality: N/A;   WRIST SURGERY      Current Outpatient Medications  Medication Sig Dispense Refill   amLODipine  (NORVASC ) 10 MG tablet Take 1 tablet (10 mg total) by mouth daily. 90 tablet 3   Ascorbic Acid (VITAMIN C PO) Take 1 tablet by mouth daily.     atorvastatin  (LIPITOR) 80 MG tablet Take 1 tablet by mouth once daily 90 tablet 1   clobetasol  cream (TEMOVATE ) 0.05 % Apply 1 Application topically 2 (two) times daily. 30 g 11   ferrous sulfate 325 (65 FE) MG tablet Take 325 mg by mouth daily with breakfast.     fluticasone  (FLONASE ) 50 MCG/ACT nasal spray Place 2 sprays into both nostrils daily. (Patient taking differently: Place 2 sprays into both nostrils daily as needed.) 16 g 0   ibuprofen  (ADVIL ) 600 MG tablet Take 600  mg by mouth every 6 (six) hours as needed for moderate pain.     losartan  (COZAAR ) 100 MG tablet Take 1 tablet by mouth once daily 90 tablet 1   Omega-3 Fatty Acids (FISH OIL PO) Take 1 capsule by mouth daily.     pantoprazole  (PROTONIX ) 40 MG tablet Take 1 tablet (40 mg total) by mouth daily as needed. (Patient taking differently: Take 40 mg by mouth daily.) 90 tablet 1   Current Facility-Administered Medications  Medication Dose Route Frequency  Provider Last Rate Last Admin   Gerhardt's butt cream   Topical TID Jayne Vonn DEL, MD       Allergies:  Patient has no known allergies.   Social History: The patient  reports that she has quit smoking. Her smoking use included cigarettes. She has been exposed to tobacco smoke. She has never used smokeless tobacco. She reports that she does not drink alcohol and does not use drugs.   Family History: The patient's family history includes Allergies in her daughter; Arthritis in her mother; Asthma in her daughter; Breast cancer in her paternal aunt; Diabetes in her mother; Heart disease (age of onset: 20) in her father; Hyperlipidemia in her father and mother; Hypertension in her brother, father, and mother; Stroke in her maternal grandmother and mother.   ROS:  Please see the history of present illness. Otherwise, complete review of systems is positive for none  All other systems are reviewed and negative.   Physical Exam: VS:  BP 132/80 (BP Location: Right Arm, Patient Position: Sitting, Cuff Size: Normal)   Pulse 68   Ht 5' 6 (1.676 m)   Wt 226 lb (102.5 kg)   LMP 07/28/2024   SpO2 99%   BMI 36.48 kg/m , BMI Body mass index is 36.48 kg/m.  Wt Readings from Last 3 Encounters:  08/24/24 226 lb (102.5 kg)  07/15/24 215 lb (97.5 kg)  05/07/24 214 lb 12.8 oz (97.4 kg)    General: Patient appears comfortable at rest. HEENT: Conjunctiva and lids normal, oropharynx clear with moist mucosa. Neck: Supple, no elevated JVP or carotid bruits, no thyromegaly. Lungs: Clear to auscultation, nonlabored breathing at rest. Cardiac: Holosystolic murmur present, 2-3/6 Abdomen: Soft, nontender, no hepatomegaly, bowel sounds present, no guarding or rebound. Extremities: No pitting edema Skin: Warm and dry. Musculoskeletal: No kyphosis. Neuropsychiatric: Alert and oriented x3, affect grossly appropriate.  Recent Labwork: 11/24/2023: ALT 14; AST 13 11/27/2023: Hemoglobin 11.5; Platelets 310 11/28/2023:  TSH 1.430 12/08/2023: BUN 10; Creatinine, Ser 0.70; Magnesium  1.9; Potassium 4.0; Sodium 140     Component Value Date/Time   CHOL 186 07/15/2024 0856   TRIG 44 07/15/2024 0856   HDL 49 07/15/2024 0856   CHOLHDL 3.8 07/15/2024 0856   CHOLHDL 5.9 (H) 05/29/2018 0805   VLDL 11 04/15/2017 1245   LDLCALC 128 (H) 07/15/2024 0856   LDLCALC 154 (H) 05/29/2018 0805    Other Studies Reviewed Today:   Assessment and Plan:  Cardiac murmur: Obtain echocardiogram.  Prior echocardiogram within normal limits.  HTN, poorly controlled: Home blood pressures range more than 130 mmHg SBP.  Currently on amlodipine  10 mg once daily and losartan  100 mg once daily.  Discussed about starting chlorthalidone 25 mg once daily but patient hesitant and does not want to add any more medications.  Strongly recommended heart healthy diet and exercise.  She will follow-up with her PCP.  HLD, not at goal: LDL consistently elevated.  LDL 128 in September 2025.  Continue atorvastatin  80 mg  nightly.  Hesitant about starting new medications.  She can follow-up with her PCP.  30-minute spent in reviewing prior medical records, more than 3 labs, discussion and documentation.    Medication Adjustments/Labs and Tests Ordered: Current medicines are reviewed at length with the patient today.  Concerns regarding medicines are outlined above.    Disposition:  Follow up pending results  Signed Jaysiah Marchetta Priya Fantasy Donald, MD, 08/24/2024 2:02 PM    [facility] at [facility] [address] [place], [place], [place] [address]

## 2024-09-07 ENCOUNTER — Ambulatory Visit: Attending: Internal Medicine

## 2024-09-07 DIAGNOSIS — R011 Cardiac murmur, unspecified: Secondary | ICD-10-CM | POA: Diagnosis not present

## 2024-09-07 LAB — ECHOCARDIOGRAM COMPLETE
AR max vel: 2.02 cm2
AV Peak grad: 15.4 mmHg
Ao pk vel: 1.96 m/s
Area-P 1/2: 3.39 cm2
Calc EF: 77.9 %
S' Lateral: 3 cm
Single Plane A2C EF: 79.8 %
Single Plane A4C EF: 75.7 %

## 2024-09-13 ENCOUNTER — Ambulatory Visit: Payer: Self-pay | Admitting: Internal Medicine

## 2024-09-14 ENCOUNTER — Ambulatory Visit (INDEPENDENT_AMBULATORY_CARE_PROVIDER_SITE_OTHER): Admitting: Obstetrics & Gynecology

## 2024-09-14 ENCOUNTER — Encounter: Payer: Self-pay | Admitting: Obstetrics & Gynecology

## 2024-09-14 VITALS — BP 167/89 | HR 81 | Ht 65.0 in | Wt 226.0 lb

## 2024-09-14 DIAGNOSIS — Z1331 Encounter for screening for depression: Secondary | ICD-10-CM

## 2024-09-14 DIAGNOSIS — Z01419 Encounter for gynecological examination (general) (routine) without abnormal findings: Secondary | ICD-10-CM | POA: Diagnosis not present

## 2024-09-14 DIAGNOSIS — L900 Lichen sclerosus et atrophicus: Secondary | ICD-10-CM | POA: Diagnosis not present

## 2024-09-14 DIAGNOSIS — R030 Elevated blood-pressure reading, without diagnosis of hypertension: Secondary | ICD-10-CM

## 2024-09-14 MED ORDER — CLOBETASOL PROPIONATE 0.05 % EX CREA: TOPICAL_CREAM | CUTANEOUS | 11 refills | Status: AC

## 2024-09-14 NOTE — Progress Notes (Signed)
 WELL-WOMAN EXAMINATION Patient name: Miranda Wade MRN [id number]  Date of birth: September 05, 1981 Chief Complaint:   Gynecologic Exam  History of Present Illness:   Miranda Wade is a 43 y.o. [phone]  female being seen today for a routine well-woman exam.   Menses have gotten lighter, now only lasting about 5 days- heavy for the first few days.  Notes that her periods are manageable.  Denies intermenstrual bleeding.  Denies significant dysmenorrhea  Denies vaginal discharge itching or irritation.  Lichen sclerosus: Diagnosed in December 2024.  She is currently using clobetasol  every other day, currently asymptomatic  Patient's last menstrual period was 09/12/2024.  The current method of family planning is tubal ligation.    Last pap 08/2023.  Last mammogram: 07/2024. Last colonoscopy: 04/2022     09/14/2024    8:31 AM 07/15/2024    8:25 AM 05/11/2024    9:58 AM 04/13/2024    9:18 AM 12/29/2023    2:38 PM  Depression screen PHQ 2/9  Decreased Interest 0 0 0 0 0  Down, Depressed, Hopeless 0 0 0 0 0  PHQ - 2 Score 0 0 0 0 0  Altered sleeping 0 0  0 0  Tired, decreased energy 1 0  0 0  Change in appetite 0 0  0 0  Feeling bad or failure about yourself  0 0  0 0  Trouble concentrating 0 0  0 0  Moving slowly or fidgety/restless 0 0  0 0  Suicidal thoughts 0 0  0 0  PHQ-9 Score 1 0   0  0   Difficult doing work/chores  Not difficult at all  Not difficult at all Not difficult at all     Data saved with a previous flowsheet row definition      Review of Systems:   Pertinent items are noted in HPI Denies any headaches, blurred vision, fatigue, shortness of breath, chest pain, abdominal pain, bowel movements, urination, or intercourse unless otherwise stated above.  Pertinent History Reviewed:  Reviewed past medical,surgical, social and family history.  Reviewed problem list, medications and allergies. Physical Assessment:   Vitals:   09/14/24 0827 09/14/24 0843  BP:  (!) 138/91 (!) 167/89  Pulse: 72 81  Weight: 226 lb (102.5 kg)   Height: 5' 5 (1.651 m)   Body mass index is 37.61 kg/m.        Physical Examination:   General appearance - well appearing, and in no distress  Mental status - alert, oriented to person, place, and time  Psych:  She has a normal mood and affect  Skin - warm and dry, normal color, no suspicious lesions noted  Chest - effort normal, all lung fields clear to auscultation bilaterally  Heart - normal rate and regular rhythm  Neck:  midline trachea, no thyromegaly or nodules  Breasts - breasts appear normal, no suspicious masses, no skin or nipple changes or  axillary nodes  Abdomen - soft, nontender, nondistended, no masses or organomegaly  Pelvic - VULVA: normal appearing vulva with no masses, tenderness or lesions  VAGINA: normal appearing vagina with normal color and discharge, no lesions  CERVIX: normal appearing cervix without discharge or lesions, no CMT-currently on menses light bleeding noted  UTERUS: uterus is felt to be normal size, shape, consistency and nontender   ADNEXA: No adnexal masses or tenderness noted.  Extremities:  No swelling or varicosities noted  Chaperone: Alan Fischer     Assessment & Plan:  1)  Well-Woman Exam -Screening exams up-to-date  2) Lichen sclerosis - Continue clobetasol -May try to continue to decrease to every few days or when symptomatic  3) elevated BP - Advised follow-up with PCP  No orders of the defined types were placed in this encounter.   Meds:  Meds ordered this encounter  Medications   clobetasol  cream (TEMOVATE ) 0.05 %    Sig: Use pea-sized amount every other day or as needed    Dispense:  30 g    Refill:  11    Follow-up: Return in about 1 year (around 09/14/2025) for Annual.   Donatello Kleve, DO Attending Obstetrician & Gynecologist, [facility] for [facility], [facility]

## 2024-10-04 ENCOUNTER — Ambulatory Visit
Admission: EM | Admit: 2024-10-04 | Discharge: 2024-10-04 | Disposition: A | Discharge status: Home / Self Care | Attending: Nurse Practitioner | Admitting: Nurse Practitioner

## 2024-10-04 DIAGNOSIS — J209 Acute bronchitis, unspecified: Secondary | ICD-10-CM | POA: Diagnosis not present

## 2024-10-04 LAB — POCT RAPID STREP A (OFFICE): Rapid Strep A Screen: NEGATIVE

## 2024-10-04 LAB — POC COVID19/FLU A&B COMBO
Covid Antigen, POC: NEGATIVE
Influenza A Antigen, POC: NEGATIVE
Influenza B Antigen, POC: NEGATIVE

## 2024-10-04 MED ORDER — METHYLPREDNISOLONE SODIUM SUCC 125 MG IJ SOLR
80.0000 mg | Freq: Once | INTRAMUSCULAR | Status: AC
  Administered 2024-10-04: 15:00:00 80 mg via INTRAMUSCULAR

## 2024-10-04 MED ORDER — PROMETHAZINE-DM 6.25-15 MG/5ML PO SYRP: 5.0000 mL | ORAL_SOLUTION | Freq: Four times a day (QID) | ORAL | 0 refills | Status: AC | PRN

## 2024-10-04 MED ORDER — ALBUTEROL SULFATE HFA 108 (90 BASE) MCG/ACT IN AERS: 2.0000 | INHALATION_SPRAY | Freq: Four times a day (QID) | RESPIRATORY_TRACT | 0 refills | Status: AC | PRN

## 2024-10-04 MED ORDER — PREDNISONE 20 MG PO TABS: 40.0000 mg | ORAL_TABLET | Freq: Every day | ORAL | 0 refills | Status: AC

## 2024-10-04 NOTE — Discharge Instructions (Signed)
 The rapid strep test and COVID/flu test were negative. You were given an injection of Solu-Medrol  80 mg.  Start the prednisone  tomorrow. Take medication as prescribed. Increase fluids and allow for plenty of rest. You may take over-the-counter Tylenol  as needed for pain, fever, or general discomfort. Recommend the use of a humidifier in your bedroom at nighttime during sleep and sleeping elevated on pillows while cough symptoms persist. As discussed, if you are generally feeling well but continued to have a persistent nagging cough, recommend over-the-counter cough and cold medications such as Delsym or Robitussin, fluids, rest.  Seek care if you develop worsening cough with wheezing, shortness of breath, fever, or other concerns. Follow-up as needed.

## 2024-10-04 NOTE — ED Provider Notes (Signed)
 [facility]    CSN: [id number] Arrival date & time: 10/04/24  1429      History   Chief Complaint No chief complaint on file.   HPI Miranda Wade is a 43 y.o. female.   The history is provided by the patient.   Patient presents with a 5-day history of sore throat, hoarseness, and cough.  She denies fever, but states that she has been feeling both hot and cold.  Further denies headache, ear pain, ear drainage, wheezing, difficulty breathing, abdominal pain, nausea, vomiting, diarrhea, or rash.  Patient states that she does feel a burning sensation in her chest.  She denies prior history of asthma or smoking.  Denies any history of any close sick contacts.  States she has been taking over-the-counter medications for her symptoms with minimal relief.  Past Medical History:  Diagnosis Date   Anemia    Essential hypertension    Hyperlipidemia    Seasonal allergies     Patient Active Problem List   Diagnosis Date Noted   Cardiac murmur 08/24/2024   Lichen sclerosus: reduce use to nightly from BID, recheck 6 months 12/17/2023   Cyst, ovary, dermoid, right 12/01/2023   Lichen planus 11/24/2023   GERD (gastroesophageal reflux disease) 01/02/2023   LVH (left ventricular hypertrophy) 07/21/2017   Essential hypertension, benign 04/30/2016   Hyperlipidemia 04/30/2016    Past Surgical History:  Procedure Laterality Date   BIOPSY  04/29/2022   Procedure: BIOPSY;  Surgeon: Cindie Carlin POUR, DO;  Location: [facility];  Service: Endoscopy;;   BREAST BIOPSY Left 01/06/2024   path pending   BREAST BIOPSY Left 01/06/2024   US  LT BREAST BX W LOC DEV 1ST LESION IMG BX SPEC US  GUIDE 01/06/2024 Lennon Nest, MD [facility]   COLONOSCOPY WITH PROPOFOL  N/A 04/29/2022   Surgeon: Cindie Carlin POUR, DO;  nonbleeding internal hemorrhoids, otherwise normal exam.   ESOPHAGOGASTRODUODENOSCOPY (EGD) WITH PROPOFOL  N/A 04/29/2022   Surgeon: Cindie Carlin POUR, DO;  cm hiatal  hernia, grade a reflux esophagitis, gastritis biopsied, single duodenal polyp resected and retrieved.  Pathology showed fragments of gastric mucosa with minimal vascular ectasia, no H. pylori.  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   KNEE ARTHROSCOPY WITH SUBCHONDROPLASTY Right 06/20/2023   Procedure: KNEE ARTHROSCOPY, PARTIAL MEDIAL MENISCECTOMY WITH MEDIAL TIBIA SUBCHONDROPLASTY;  Surgeon: Sharl Selinda Dover, MD;  Location: [facility];  Service: Orthopedics;  Laterality: Right;   LAPAROSCOPIC BILATERAL SALPINGECTOMY Bilateral 05/01/2022   Procedure: LAPAROSCOPIC BILATERAL SALPINGECTOMY;  Surgeon: Jayne Vonn DEL, MD;  Location: [facility];  Service: Gynecology;  Laterality: Bilateral;   MASS EXCISION Right 08/04/2017   Procedure: EXCISION OF RIGHT NASAL  MASS;  Surgeon: Karis Clunes, MD;  Location: [facility];  Service: ENT;  Laterality: Right;  LOCAL   POLYPECTOMY  04/29/2022   Procedure: POLYPECTOMY;  Surgeon: Cindie Carlin POUR, DO;  Location: [facility];  Service: Endoscopy;;   REMOVAL OF NON VAGINAL CONTRACEPTIVE DEVICE N/A 05/01/2022   Procedure: REMOVAL OF NON VAGINAL CONTRACEPTIVE DEVICE;  Surgeon: Jayne Vonn DEL, MD;  Location: [facility];  Service: Gynecology;  Laterality: N/A;   WRIST SURGERY      OB History     Gravida  3   Para  2   Term  2   Preterm      AB  1   Living  2      SAB  IAB      Ectopic      Multiple      Live Births  1            Home Medications    Prior to Admission medications  Medication Sig Start Date End Date Taking? Authorizing Provider  albuterol  (VENTOLIN  HFA) 108 (90 Base) MCG/ACT inhaler Inhale 2 puffs into the lungs every 6 (six) hours as needed. 10/04/24  Yes Leath-Warren, Etta PARAS, NP  predniSONE  (DELTASONE ) 20 MG tablet Take 2 tablets (40 mg total) by mouth daily with breakfast for 5 days.  10/04/24 10/09/24 Yes Leath-Warren, Etta PARAS, NP  promethazine -dextromethorphan (PROMETHAZINE -DM) 6.25-15 MG/5ML syrup Take 5 mLs by mouth 4 (four) times daily as needed. 10/04/24  Yes Leath-Warren, Etta PARAS, NP  amLODipine  (NORVASC ) 10 MG tablet Take 1 tablet (10 mg total) by mouth daily. 04/13/24   Cook, Jayce G, DO  Ascorbic Acid (VITAMIN C PO) Take 1 tablet by mouth daily.    [provider]  atorvastatin  (LIPITOR) 80 MG tablet Take 1 tablet by mouth once daily 06/23/24   Cook, Jayce G, DO  clobetasol  cream (TEMOVATE ) 0.05 % Use pea-sized amount every other day or as needed 09/14/24   Ozan, Jennifer, DO  ferrous sulfate 325 (65 FE) MG tablet Take 325 mg by mouth daily with breakfast.    [provider]  fluticasone  (FLONASE ) 50 MCG/ACT nasal spray Place 2 sprays into both nostrils daily. Patient taking differently: Place 2 sprays into both nostrils daily as needed. 10/08/22   Leath-Warren, Etta PARAS, NP  ibuprofen  (ADVIL ) 600 MG tablet Take 600 mg by mouth every 6 (six) hours as needed for moderate pain.    [provider]  losartan  (COZAAR ) 100 MG tablet Take 1 tablet by mouth once daily 06/23/24   Cook, Jayce G, DO  Omega-3 Fatty Acids (FISH OIL PO) Take 1 capsule by mouth daily.    [provider]  pantoprazole  (PROTONIX ) 40 MG tablet Take 1 tablet (40 mg total) by mouth daily as needed. Patient taking differently: Take 40 mg by mouth daily. 07/15/24   Cook, Jayce G, DO    Family History Family History  Problem Relation Age of Onset   Stroke Maternal Grandmother    Hypertension Father    Heart disease Father 80       Valve replacement   Hyperlipidemia Father    Diabetes Mother    Hypertension Mother    Arthritis Mother    Hyperlipidemia Mother    Stroke Mother    Hypertension Brother    Asthma Daughter    Allergies Daughter    Breast cancer Paternal Aunt     Social History Social History[1]   Allergies   Patient has no known  allergies.   Review of Systems Review of Systems Per HPI  Physical Exam Triage Vital Signs ED Triage Vitals  Encounter Vitals Group     BP 10/04/24 1441 (!) 155/88     Girls Systolic BP Percentile --      Girls Diastolic BP Percentile --      Boys Systolic BP Percentile --      Boys Diastolic BP Percentile --      Pulse Rate 10/04/24 1441 78     Resp 10/04/24 1441 20     Temp 10/04/24 1441 98.4 F (36.9 C)     Temp Source 10/04/24 1441 Oral     SpO2 10/04/24 1441 99 %     Weight --  Height --      Head Circumference --      Peak Flow --      Pain Score 10/04/24 1444 3     Pain Loc --      Pain Education --      Exclude from Growth Chart --    No data found.  Updated Vital Signs BP (!) 155/88 (BP Location: Right Arm)   Pulse 78   Temp 98.4 F (36.9 C) (Oral)   Resp 20   LMP 09/12/2024   SpO2 99%   Visual Acuity Right Eye Distance:   Left Eye Distance:   Bilateral Distance:    Right Eye Near:   Left Eye Near:    Bilateral Near:     Physical Exam Vitals and nursing note reviewed.  Constitutional:      General: She is not in acute distress.    Appearance: Normal appearance.  HENT:     Head: Normocephalic.     Right Ear: Tympanic membrane, ear canal and external ear normal.     Left Ear: Tympanic membrane, ear canal and external ear normal.     Nose: Nose normal.     Right Turbinates: Enlarged and swollen.     Left Turbinates: Enlarged and swollen.     Right Sinus: No maxillary sinus tenderness or frontal sinus tenderness.     Left Sinus: No maxillary sinus tenderness or frontal sinus tenderness.     Mouth/Throat:     Lips: Pink.     Mouth: Mucous membranes are moist.     Pharynx: Posterior oropharyngeal erythema and postnasal drip present. No pharyngeal swelling, oropharyngeal exudate or uvula swelling.     Comments: Cobblestoning present to posterior oropharynx  Eyes:     Extraocular Movements: Extraocular movements intact.      Conjunctiva/sclera: Conjunctivae normal.     Pupils: Pupils are equal, round, and reactive to light.  Cardiovascular:     Rate and Rhythm: Normal rate and regular rhythm.     Pulses: Normal pulses.     Heart sounds: Normal heart sounds.  Pulmonary:     Effort: Pulmonary effort is normal. No respiratory distress.     Breath sounds: Normal breath sounds. No stridor. No wheezing, rhonchi or rales.  Abdominal:     General: Bowel sounds are normal.     Palpations: Abdomen is soft.  Musculoskeletal:     Cervical back: Normal range of motion.  Skin:    General: Skin is warm and dry.  Neurological:     General: No focal deficit present.     Mental Status: She is alert and oriented to person, place, and time.  Psychiatric:        Mood and Affect: Mood normal.        Behavior: Behavior normal.      UC Treatments / Results  Labs (all labs ordered are listed, but only abnormal results are displayed) Labs Reviewed  POC COVID19/FLU A&B COMBO - Normal  POCT RAPID STREP A (OFFICE) - Normal    EKG   Radiology No results found.  Procedures Procedures (including critical care time)  Medications Ordered in UC Medications  methylPREDNISolone  sodium succinate (SOLU-MEDROL ) 125 mg/2 mL injection 80 mg (80 mg Intramuscular Given 10/04/24 1512)    Initial Impression / Assessment and Plan / UC Course  I have reviewed the triage vital signs and the nursing notes.  Pertinent labs & imaging results that were available during my care of the patient were  reviewed by me and considered in my medical decision making (see chart for details).  COVID/flu and rapid strep test were negative.  On exam, the patient's lung sounds were clear throughout, room air sats were 99%.  She does present for complaints of cough that is been present for the past 5 days along with a burning sensation in her chest.  Concern for viral bronchitis at this time.  Solu-Medrol  80 mg IM administered for bronchial  inflammation.  Will start patient on prednisone  40 mg, and albuterol  inhaler, and Promethazine  DM for the cough.  Supportive care recommendations were provided and discussed with the patient to include fluids, rest, over-the-counter analgesics, use of a humidifier during sleep.  Discussed indications with the patient regarding follow-up.  Patient was in agreement with this plan of care and verbalizes understanding.  All questions were answered.  Patient stable for discharge.  Final Clinical Impressions(s) / UC Diagnoses   Final diagnoses:  Acute bronchitis, unspecified organism     Discharge Instructions      The rapid strep test and COVID/flu test were negative. You were given an injection of Solu-Medrol  80 mg.  Start the prednisone  tomorrow. Take medication as prescribed. Increase fluids and allow for plenty of rest. You may take over-the-counter Tylenol  as needed for pain, fever, or general discomfort. Recommend the use of a humidifier in your bedroom at nighttime during sleep and sleeping elevated on pillows while cough symptoms persist. As discussed, if you are generally feeling well but continued to have a persistent nagging cough, recommend over-the-counter cough and cold medications such as Delsym or Robitussin, fluids, rest.  Seek care if you develop worsening cough with wheezing, shortness of breath, fever, or other concerns. Follow-up as needed.     ED Prescriptions     Medication Sig Dispense Auth. Provider   predniSONE  (DELTASONE ) 20 MG tablet Take 2 tablets (40 mg total) by mouth daily with breakfast for 5 days. 10 tablet Leath-Warren, Etta PARAS, NP   promethazine -dextromethorphan (PROMETHAZINE -DM) 6.25-15 MG/5ML syrup Take 5 mLs by mouth 4 (four) times daily as needed. 118 mL Leath-Warren, Etta PARAS, NP   albuterol  (VENTOLIN  HFA) 108 (90 Base) MCG/ACT inhaler Inhale 2 puffs into the lungs every 6 (six) hours as needed. 8 g Leath-Warren, Etta PARAS, NP      PDMP not  reviewed this encounter.    [1]  Social History Tobacco Use   Smoking status: Former    Current packs/day: 0.50    Types: Cigarettes    Passive exposure: Current   Smokeless tobacco: Never  Vaping Use   Vaping status: Never Used  Substance Use Topics   Alcohol use: No   Drug use: No     Gilmer Etta PARAS, NP 10/04/24 1617

## 2024-10-04 NOTE — ED Triage Notes (Signed)
 Pt reports sore throat and cough started Wednesday. Pt has tried OTC cold and flu, Mucin ex, and throat lozenges.

## 2024-10-20 ENCOUNTER — Encounter: Payer: Self-pay | Admitting: Internal Medicine

## 2025-01-13 ENCOUNTER — Ambulatory Visit: Admitting: Family Medicine
